# Patient Record
Sex: Female | Born: 1965 | Race: Black or African American | Hispanic: No | Marital: Single | Smoking: Never smoker
Health system: Southern US, Community
[De-identification: ages and names within clinical notes are randomized; demographics above are authoritative.]

## PROBLEM LIST (undated history)

## (undated) DIAGNOSIS — B59 Pneumocystosis: Secondary | ICD-10-CM

## (undated) DIAGNOSIS — B20 Human immunodeficiency virus [HIV] disease: Secondary | ICD-10-CM

## (undated) DIAGNOSIS — C9000 Multiple myeloma not having achieved remission: Secondary | ICD-10-CM

## (undated) DIAGNOSIS — J189 Pneumonia, unspecified organism: Secondary | ICD-10-CM

## (undated) DIAGNOSIS — D259 Leiomyoma of uterus, unspecified: Secondary | ICD-10-CM

## (undated) DIAGNOSIS — G629 Polyneuropathy, unspecified: Secondary | ICD-10-CM

## (undated) DIAGNOSIS — Z8619 Personal history of other infectious and parasitic diseases: Secondary | ICD-10-CM

## (undated) DIAGNOSIS — Z87898 Personal history of other specified conditions: Secondary | ICD-10-CM

## (undated) HISTORY — PX: NO PAST SURGERIES: SHX2092

## (undated) HISTORY — PX: BONE MARROW TRANSPLANT: SHX200

## (undated) HISTORY — DX: Multiple myeloma not having achieved remission: C90.00

---

## 2000-04-05 ENCOUNTER — Ambulatory Visit (HOSPITAL_COMMUNITY): Admission: RE | Admit: 2000-04-05 | Discharge: 2000-04-05 | Payer: Self-pay | Admitting: Gastroenterology

## 2000-04-05 ENCOUNTER — Encounter: Payer: Self-pay | Admitting: Gastroenterology

## 2000-10-14 ENCOUNTER — Emergency Department (HOSPITAL_COMMUNITY): Admission: EM | Admit: 2000-10-14 | Discharge: 2000-10-14 | Payer: Self-pay | Admitting: Emergency Medicine

## 2001-07-19 ENCOUNTER — Encounter: Admission: RE | Admit: 2001-07-19 | Discharge: 2001-07-19 | Payer: Self-pay | Admitting: General Practice

## 2001-07-19 ENCOUNTER — Encounter: Payer: Self-pay | Admitting: General Practice

## 2002-01-08 ENCOUNTER — Encounter: Payer: Self-pay | Admitting: Emergency Medicine

## 2002-01-08 ENCOUNTER — Inpatient Hospital Stay (HOSPITAL_COMMUNITY): Admission: EM | Admit: 2002-01-08 | Discharge: 2002-01-15 | Payer: Self-pay | Admitting: Emergency Medicine

## 2002-01-08 DIAGNOSIS — Z8619 Personal history of other infectious and parasitic diseases: Secondary | ICD-10-CM

## 2002-01-08 DIAGNOSIS — B59 Pneumocystosis: Secondary | ICD-10-CM

## 2002-01-08 HISTORY — DX: Personal history of other infectious and parasitic diseases: Z86.19

## 2002-01-08 HISTORY — DX: Pneumocystosis: B59

## 2002-01-09 ENCOUNTER — Encounter (INDEPENDENT_AMBULATORY_CARE_PROVIDER_SITE_OTHER): Payer: Self-pay | Admitting: *Deleted

## 2002-01-09 ENCOUNTER — Encounter: Payer: Self-pay | Admitting: Internal Medicine

## 2002-01-09 LAB — CONVERTED CEMR LAB
CD4 Count: 40 microliters
CD4 T Cell Abs: 40

## 2002-01-10 ENCOUNTER — Encounter: Payer: Self-pay | Admitting: Internal Medicine

## 2002-02-18 ENCOUNTER — Encounter: Admission: RE | Admit: 2002-02-18 | Discharge: 2002-02-18 | Payer: Self-pay | Admitting: Infectious Diseases

## 2002-02-18 ENCOUNTER — Ambulatory Visit (HOSPITAL_COMMUNITY): Admission: RE | Admit: 2002-02-18 | Discharge: 2002-02-18 | Payer: Self-pay | Admitting: Infectious Diseases

## 2002-02-21 ENCOUNTER — Encounter: Admission: RE | Admit: 2002-02-21 | Discharge: 2002-02-21 | Payer: Self-pay | Admitting: Infectious Diseases

## 2002-02-22 ENCOUNTER — Encounter: Admission: RE | Admit: 2002-02-22 | Discharge: 2002-02-22 | Payer: Self-pay | Admitting: Infectious Diseases

## 2002-02-27 ENCOUNTER — Encounter: Admission: RE | Admit: 2002-02-27 | Discharge: 2002-02-27 | Payer: Self-pay | Admitting: Infectious Diseases

## 2002-03-04 ENCOUNTER — Encounter: Admission: RE | Admit: 2002-03-04 | Discharge: 2002-03-04 | Payer: Self-pay | Admitting: Infectious Diseases

## 2002-03-14 ENCOUNTER — Encounter: Admission: RE | Admit: 2002-03-14 | Discharge: 2002-03-14 | Payer: Self-pay | Admitting: Infectious Diseases

## 2002-04-08 ENCOUNTER — Ambulatory Visit (HOSPITAL_COMMUNITY): Admission: RE | Admit: 2002-04-08 | Discharge: 2002-04-08 | Payer: Self-pay | Admitting: Infectious Diseases

## 2002-04-08 ENCOUNTER — Encounter: Admission: RE | Admit: 2002-04-08 | Discharge: 2002-04-08 | Payer: Self-pay | Admitting: Infectious Diseases

## 2002-05-06 ENCOUNTER — Encounter: Admission: RE | Admit: 2002-05-06 | Discharge: 2002-05-06 | Payer: Self-pay | Admitting: Infectious Diseases

## 2002-06-17 ENCOUNTER — Encounter: Admission: RE | Admit: 2002-06-17 | Discharge: 2002-06-17 | Payer: Self-pay | Admitting: Infectious Diseases

## 2002-06-17 ENCOUNTER — Ambulatory Visit (HOSPITAL_COMMUNITY): Admission: RE | Admit: 2002-06-17 | Discharge: 2002-06-17 | Payer: Self-pay | Admitting: Infectious Diseases

## 2002-07-08 ENCOUNTER — Encounter: Admission: RE | Admit: 2002-07-08 | Discharge: 2002-07-08 | Payer: Self-pay | Admitting: Infectious Diseases

## 2002-10-14 ENCOUNTER — Encounter: Admission: RE | Admit: 2002-10-14 | Discharge: 2002-10-14 | Payer: Self-pay | Admitting: Infectious Diseases

## 2002-10-18 ENCOUNTER — Encounter: Payer: Self-pay | Admitting: Emergency Medicine

## 2002-10-19 ENCOUNTER — Inpatient Hospital Stay (HOSPITAL_COMMUNITY): Admission: EM | Admit: 2002-10-19 | Discharge: 2002-11-09 | Payer: Self-pay | Admitting: Emergency Medicine

## 2002-10-19 ENCOUNTER — Encounter: Payer: Self-pay | Admitting: Internal Medicine

## 2002-10-19 ENCOUNTER — Encounter: Payer: Self-pay | Admitting: Emergency Medicine

## 2002-10-20 ENCOUNTER — Encounter: Payer: Self-pay | Admitting: Pulmonary Disease

## 2002-10-20 ENCOUNTER — Encounter: Payer: Self-pay | Admitting: Internal Medicine

## 2002-10-21 ENCOUNTER — Encounter: Payer: Self-pay | Admitting: Pulmonary Disease

## 2002-10-22 ENCOUNTER — Encounter: Payer: Self-pay | Admitting: Pulmonary Disease

## 2002-10-23 ENCOUNTER — Encounter: Payer: Self-pay | Admitting: Pulmonary Disease

## 2002-10-24 ENCOUNTER — Encounter: Payer: Self-pay | Admitting: Pulmonary Disease

## 2002-10-26 ENCOUNTER — Encounter: Payer: Self-pay | Admitting: Pulmonary Disease

## 2002-10-27 ENCOUNTER — Encounter: Payer: Self-pay | Admitting: Pulmonary Disease

## 2002-10-28 ENCOUNTER — Encounter: Payer: Self-pay | Admitting: Pulmonary Disease

## 2002-10-28 ENCOUNTER — Encounter (INDEPENDENT_AMBULATORY_CARE_PROVIDER_SITE_OTHER): Payer: Self-pay | Admitting: Cardiology

## 2002-10-29 ENCOUNTER — Encounter: Payer: Self-pay | Admitting: Pulmonary Disease

## 2002-10-30 ENCOUNTER — Encounter: Payer: Self-pay | Admitting: Critical Care Medicine

## 2002-10-31 ENCOUNTER — Encounter: Payer: Self-pay | Admitting: Internal Medicine

## 2002-11-06 ENCOUNTER — Encounter: Payer: Self-pay | Admitting: Internal Medicine

## 2002-11-18 ENCOUNTER — Encounter: Admission: RE | Admit: 2002-11-18 | Discharge: 2002-11-18 | Payer: Self-pay | Admitting: Infectious Diseases

## 2003-01-23 ENCOUNTER — Encounter: Admission: RE | Admit: 2003-01-23 | Discharge: 2003-01-23 | Payer: Self-pay | Admitting: Infectious Diseases

## 2003-01-23 ENCOUNTER — Encounter (INDEPENDENT_AMBULATORY_CARE_PROVIDER_SITE_OTHER): Payer: Self-pay | Admitting: Infectious Diseases

## 2003-02-03 ENCOUNTER — Encounter: Admission: RE | Admit: 2003-02-03 | Discharge: 2003-02-03 | Payer: Self-pay | Admitting: Infectious Diseases

## 2003-04-28 ENCOUNTER — Encounter (INDEPENDENT_AMBULATORY_CARE_PROVIDER_SITE_OTHER): Payer: Self-pay | Admitting: Infectious Diseases

## 2003-04-28 ENCOUNTER — Encounter: Admission: RE | Admit: 2003-04-28 | Discharge: 2003-04-28 | Payer: Self-pay | Admitting: Infectious Diseases

## 2003-04-28 ENCOUNTER — Ambulatory Visit (HOSPITAL_COMMUNITY): Admission: RE | Admit: 2003-04-28 | Discharge: 2003-04-28 | Payer: Self-pay | Admitting: Infectious Diseases

## 2003-05-12 ENCOUNTER — Encounter: Admission: RE | Admit: 2003-05-12 | Discharge: 2003-05-12 | Payer: Self-pay | Admitting: Infectious Diseases

## 2003-05-30 ENCOUNTER — Encounter (INDEPENDENT_AMBULATORY_CARE_PROVIDER_SITE_OTHER): Payer: Self-pay | Admitting: Specialist

## 2003-05-30 ENCOUNTER — Encounter: Admission: RE | Admit: 2003-05-30 | Discharge: 2003-05-30 | Payer: Self-pay | Admitting: Infectious Diseases

## 2003-07-08 ENCOUNTER — Encounter (INDEPENDENT_AMBULATORY_CARE_PROVIDER_SITE_OTHER): Payer: Self-pay

## 2003-07-08 ENCOUNTER — Other Ambulatory Visit: Admission: RE | Admit: 2003-07-08 | Discharge: 2003-07-08 | Payer: Self-pay | Admitting: Obstetrics and Gynecology

## 2003-07-08 ENCOUNTER — Encounter: Admission: RE | Admit: 2003-07-08 | Discharge: 2003-07-08 | Payer: Self-pay | Admitting: Obstetrics and Gynecology

## 2003-07-29 ENCOUNTER — Encounter: Admission: RE | Admit: 2003-07-29 | Discharge: 2003-07-29 | Payer: Self-pay | Admitting: Internal Medicine

## 2003-08-11 ENCOUNTER — Encounter: Admission: RE | Admit: 2003-08-11 | Discharge: 2003-08-11 | Payer: Self-pay | Admitting: Infectious Diseases

## 2003-08-11 ENCOUNTER — Encounter (INDEPENDENT_AMBULATORY_CARE_PROVIDER_SITE_OTHER): Payer: Self-pay | Admitting: Infectious Diseases

## 2003-08-11 ENCOUNTER — Ambulatory Visit (HOSPITAL_COMMUNITY): Admission: RE | Admit: 2003-08-11 | Discharge: 2003-08-11 | Payer: Self-pay | Admitting: Infectious Diseases

## 2003-08-12 ENCOUNTER — Encounter: Admission: RE | Admit: 2003-08-12 | Discharge: 2003-08-12 | Payer: Self-pay | Admitting: Obstetrics and Gynecology

## 2003-08-25 ENCOUNTER — Encounter: Admission: RE | Admit: 2003-08-25 | Discharge: 2003-08-25 | Payer: Self-pay | Admitting: Infectious Diseases

## 2003-09-02 ENCOUNTER — Other Ambulatory Visit: Admission: RE | Admit: 2003-09-02 | Discharge: 2003-09-02 | Payer: Self-pay | Admitting: Obstetrics and Gynecology

## 2003-09-02 ENCOUNTER — Encounter: Admission: RE | Admit: 2003-09-02 | Discharge: 2003-09-02 | Payer: Self-pay | Admitting: Obstetrics and Gynecology

## 2003-09-02 ENCOUNTER — Encounter (INDEPENDENT_AMBULATORY_CARE_PROVIDER_SITE_OTHER): Payer: Self-pay | Admitting: *Deleted

## 2003-09-15 ENCOUNTER — Ambulatory Visit (HOSPITAL_COMMUNITY): Admission: RE | Admit: 2003-09-15 | Discharge: 2003-09-15 | Payer: Self-pay | Admitting: Infectious Diseases

## 2003-09-16 ENCOUNTER — Encounter: Admission: RE | Admit: 2003-09-16 | Discharge: 2003-09-16 | Payer: Self-pay | Admitting: Obstetrics and Gynecology

## 2003-09-22 ENCOUNTER — Encounter: Admission: RE | Admit: 2003-09-22 | Discharge: 2003-09-22 | Payer: Self-pay | Admitting: Infectious Diseases

## 2003-12-08 ENCOUNTER — Encounter: Admission: RE | Admit: 2003-12-08 | Discharge: 2003-12-08 | Payer: Self-pay | Admitting: Infectious Diseases

## 2003-12-08 ENCOUNTER — Ambulatory Visit (HOSPITAL_COMMUNITY): Admission: RE | Admit: 2003-12-08 | Discharge: 2003-12-08 | Payer: Self-pay | Admitting: Infectious Diseases

## 2003-12-22 ENCOUNTER — Encounter: Admission: RE | Admit: 2003-12-22 | Discharge: 2003-12-22 | Payer: Self-pay | Admitting: Infectious Diseases

## 2004-01-13 ENCOUNTER — Encounter (INDEPENDENT_AMBULATORY_CARE_PROVIDER_SITE_OTHER): Payer: Self-pay | Admitting: *Deleted

## 2004-01-13 ENCOUNTER — Encounter: Admission: RE | Admit: 2004-01-13 | Discharge: 2004-01-13 | Payer: Self-pay | Admitting: Obstetrics and Gynecology

## 2004-04-08 ENCOUNTER — Encounter: Admission: RE | Admit: 2004-04-08 | Discharge: 2004-04-08 | Payer: Self-pay | Admitting: Infectious Diseases

## 2004-04-08 ENCOUNTER — Ambulatory Visit (HOSPITAL_COMMUNITY): Admission: RE | Admit: 2004-04-08 | Discharge: 2004-04-08 | Payer: Self-pay | Admitting: Infectious Diseases

## 2004-04-26 ENCOUNTER — Encounter: Admission: RE | Admit: 2004-04-26 | Discharge: 2004-04-26 | Payer: Self-pay | Admitting: Infectious Diseases

## 2004-07-13 ENCOUNTER — Ambulatory Visit: Payer: Self-pay | Admitting: *Deleted

## 2004-07-27 ENCOUNTER — Ambulatory Visit: Payer: Self-pay | Admitting: Infectious Diseases

## 2004-08-23 ENCOUNTER — Ambulatory Visit: Payer: Self-pay | Admitting: Infectious Diseases

## 2004-08-23 ENCOUNTER — Ambulatory Visit (HOSPITAL_COMMUNITY): Admission: RE | Admit: 2004-08-23 | Discharge: 2004-08-23 | Payer: Self-pay | Admitting: Infectious Diseases

## 2004-09-06 ENCOUNTER — Ambulatory Visit: Payer: Self-pay | Admitting: Infectious Diseases

## 2005-01-10 ENCOUNTER — Ambulatory Visit: Payer: Self-pay | Admitting: Infectious Diseases

## 2005-01-10 ENCOUNTER — Ambulatory Visit (HOSPITAL_COMMUNITY): Admission: RE | Admit: 2005-01-10 | Discharge: 2005-01-10 | Payer: Self-pay | Admitting: Infectious Diseases

## 2005-03-18 ENCOUNTER — Encounter: Payer: Self-pay | Admitting: Family Medicine

## 2005-03-18 ENCOUNTER — Ambulatory Visit: Payer: Self-pay | Admitting: Family Medicine

## 2005-05-23 ENCOUNTER — Ambulatory Visit (HOSPITAL_COMMUNITY): Admission: RE | Admit: 2005-05-23 | Discharge: 2005-05-23 | Payer: Self-pay | Admitting: Infectious Diseases

## 2005-05-23 ENCOUNTER — Ambulatory Visit: Payer: Self-pay | Admitting: Infectious Diseases

## 2005-06-06 ENCOUNTER — Ambulatory Visit: Payer: Self-pay | Admitting: Infectious Diseases

## 2005-09-22 ENCOUNTER — Ambulatory Visit: Payer: Self-pay | Admitting: Infectious Diseases

## 2005-09-22 ENCOUNTER — Encounter (INDEPENDENT_AMBULATORY_CARE_PROVIDER_SITE_OTHER): Payer: Self-pay | Admitting: *Deleted

## 2005-09-22 ENCOUNTER — Ambulatory Visit (HOSPITAL_COMMUNITY): Admission: RE | Admit: 2005-09-22 | Discharge: 2005-09-22 | Payer: Self-pay | Admitting: Infectious Diseases

## 2005-09-22 LAB — CONVERTED CEMR LAB
CD4 Count: 440 microliters
HIV 1 RNA Quant: 49 copies/mL

## 2005-09-23 ENCOUNTER — Ambulatory Visit: Payer: Self-pay | Admitting: Family Medicine

## 2005-09-23 ENCOUNTER — Encounter: Payer: Self-pay | Admitting: Family Medicine

## 2005-09-29 ENCOUNTER — Ambulatory Visit: Payer: Self-pay | Admitting: Infectious Diseases

## 2006-04-07 ENCOUNTER — Encounter (INDEPENDENT_AMBULATORY_CARE_PROVIDER_SITE_OTHER): Payer: Self-pay | Admitting: *Deleted

## 2006-04-07 ENCOUNTER — Encounter: Admission: RE | Admit: 2006-04-07 | Discharge: 2006-04-07 | Payer: Self-pay | Admitting: Infectious Diseases

## 2006-04-07 ENCOUNTER — Ambulatory Visit: Payer: Self-pay | Admitting: Infectious Diseases

## 2006-04-07 LAB — CONVERTED CEMR LAB: HIV 1 RNA Quant: 49 copies/mL

## 2006-04-19 ENCOUNTER — Ambulatory Visit: Payer: Self-pay | Admitting: Obstetrics and Gynecology

## 2006-04-20 ENCOUNTER — Ambulatory Visit: Payer: Self-pay | Admitting: Infectious Diseases

## 2006-08-01 ENCOUNTER — Ambulatory Visit: Payer: Self-pay | Admitting: Infectious Diseases

## 2006-08-07 DIAGNOSIS — R7881 Bacteremia: Secondary | ICD-10-CM | POA: Insufficient documentation

## 2006-08-07 DIAGNOSIS — A419 Sepsis, unspecified organism: Secondary | ICD-10-CM | POA: Insufficient documentation

## 2006-08-07 DIAGNOSIS — B20 Human immunodeficiency virus [HIV] disease: Secondary | ICD-10-CM | POA: Insufficient documentation

## 2006-08-07 DIAGNOSIS — Z8709 Personal history of other diseases of the respiratory system: Secondary | ICD-10-CM | POA: Insufficient documentation

## 2006-08-07 DIAGNOSIS — G609 Hereditary and idiopathic neuropathy, unspecified: Secondary | ICD-10-CM | POA: Insufficient documentation

## 2006-08-07 DIAGNOSIS — J13 Pneumonia due to Streptococcus pneumoniae: Secondary | ICD-10-CM

## 2006-09-29 ENCOUNTER — Ambulatory Visit: Payer: Self-pay | Admitting: Infectious Diseases

## 2006-09-29 ENCOUNTER — Encounter: Admission: RE | Admit: 2006-09-29 | Discharge: 2006-09-29 | Payer: Self-pay | Admitting: Infectious Diseases

## 2006-09-29 ENCOUNTER — Encounter (INDEPENDENT_AMBULATORY_CARE_PROVIDER_SITE_OTHER): Payer: Self-pay | Admitting: *Deleted

## 2006-09-29 LAB — CONVERTED CEMR LAB
ALT: 26 units/L (ref 0–35)
AST: 35 units/L (ref 0–37)
Albumin: 3.8 g/dL (ref 3.5–5.2)
Alkaline Phosphatase: 95 units/L (ref 39–117)
BUN: 15 mg/dL (ref 6–23)
Basophils Absolute: 0 10*3/uL (ref 0.0–0.1)
Basophils Relative: 0 % (ref 0–1)
CO2: 24 meq/L (ref 19–32)
Calcium: 9.3 mg/dL (ref 8.4–10.5)
Chloride: 103 meq/L (ref 96–112)
Creatinine, Ser: 0.84 mg/dL (ref 0.40–1.20)
Eosinophils Relative: 1 % (ref 0–4)
Glucose, Bld: 73 mg/dL (ref 70–99)
HCT: 39.3 % (ref 34.4–43.3)
HIV 1 RNA Quant: <50 copies/mL (ref <50)
Hemoglobin: 12.9 g/dL (ref 11.7–14.8)
Lymphocytes Relative: 41 % (ref 15–43)
Lymphs Abs: 1.9 10*3/uL (ref 0.8–3.1)
MCHC: 32.8 g/dL — ABNORMAL LOW (ref 33.1–35.4)
MCV: 102.3 fL — ABNORMAL HIGH (ref 78.8–100.0)
Monocytes Absolute: 0.2 10*3/uL (ref 0.2–0.7)
Monocytes Relative: 5 % (ref 3–11)
Neutro Abs: 2.4 10*3/uL (ref 1.8–6.8)
Neutrophils Relative %: 53 % (ref 47–77)
Platelets: 317 10*3/uL (ref 152–374)
Potassium: 4 meq/L (ref 3.5–5.3)
RBC: 3.84 M/uL (ref 3.79–4.96)
RDW: 13.7 % (ref 11.5–15.3)
Sodium: 135 meq/L (ref 135–145)
Total Bilirubin: 0.8 mg/dL (ref 0.3–1.2)
Total Protein: 9.2 g/dL — ABNORMAL HIGH (ref 6.0–8.3)
WBC: 4.6 10*3/uL (ref 3.7–10.0)

## 2006-10-23 ENCOUNTER — Ambulatory Visit: Payer: Self-pay | Admitting: Infectious Diseases

## 2006-11-20 ENCOUNTER — Encounter (INDEPENDENT_AMBULATORY_CARE_PROVIDER_SITE_OTHER): Payer: Self-pay | Admitting: Infectious Diseases

## 2006-12-04 ENCOUNTER — Encounter (INDEPENDENT_AMBULATORY_CARE_PROVIDER_SITE_OTHER): Payer: Self-pay | Admitting: *Deleted

## 2006-12-04 LAB — CONVERTED CEMR LAB

## 2006-12-17 ENCOUNTER — Encounter (INDEPENDENT_AMBULATORY_CARE_PROVIDER_SITE_OTHER): Payer: Self-pay | Admitting: *Deleted

## 2006-12-18 ENCOUNTER — Encounter (INDEPENDENT_AMBULATORY_CARE_PROVIDER_SITE_OTHER): Payer: Self-pay | Admitting: *Deleted

## 2007-01-04 ENCOUNTER — Telehealth (INDEPENDENT_AMBULATORY_CARE_PROVIDER_SITE_OTHER): Payer: Self-pay | Admitting: Infectious Diseases

## 2007-03-07 ENCOUNTER — Ambulatory Visit: Payer: Self-pay | Admitting: Infectious Diseases

## 2007-03-27 ENCOUNTER — Encounter: Payer: Self-pay | Admitting: Internal Medicine

## 2007-04-18 ENCOUNTER — Encounter: Admission: RE | Admit: 2007-04-18 | Discharge: 2007-04-18 | Payer: Self-pay | Admitting: Internal Medicine

## 2007-04-18 ENCOUNTER — Ambulatory Visit: Payer: Self-pay | Admitting: Internal Medicine

## 2007-04-18 LAB — CONVERTED CEMR LAB
Alkaline Phosphatase: 103 units/L (ref 39–117)
CO2: 22 meq/L (ref 19–32)
Creatinine, Ser: 0.72 mg/dL (ref 0.40–1.20)
Eosinophils Absolute: 0 10*3/uL (ref 0.0–0.7)
Eosinophils Relative: 0 % (ref 0–5)
Glucose, Bld: 92 mg/dL (ref 70–99)
HCT: 37.7 % (ref 36.0–46.0)
HIV 1 RNA Quant: <50 copies/mL (ref <50)
HIV-1 RNA Quant, Log: <1.7 (ref <1.70)
Hemoglobin: 12.3 g/dL (ref 12.0–15.0)
Lymphocytes Relative: 51 % — ABNORMAL HIGH (ref 12–46)
Lymphs Abs: 2.3 10*3/uL (ref 0.7–3.3)
MCV: 101.6 fL — ABNORMAL HIGH (ref 78.0–100.0)
Monocytes Absolute: 0.3 10*3/uL (ref 0.2–0.7)
Platelets: 298 10*3/uL (ref 150–400)
RDW: 13.6 % (ref 11.5–14.0)
Total Bilirubin: 0.3 mg/dL (ref 0.3–1.2)
WBC: 4.5 10*3/uL (ref 4.0–10.5)

## 2007-04-19 ENCOUNTER — Encounter: Payer: Self-pay | Admitting: Internal Medicine

## 2007-04-30 ENCOUNTER — Encounter (INDEPENDENT_AMBULATORY_CARE_PROVIDER_SITE_OTHER): Payer: Self-pay | Admitting: *Deleted

## 2007-04-30 LAB — CONVERTED CEMR LAB: Pap Smear: NORMAL

## 2007-05-02 ENCOUNTER — Ambulatory Visit: Payer: Self-pay | Admitting: Internal Medicine

## 2007-10-09 ENCOUNTER — Encounter (INDEPENDENT_AMBULATORY_CARE_PROVIDER_SITE_OTHER): Payer: Self-pay | Admitting: *Deleted

## 2007-10-15 ENCOUNTER — Encounter: Admission: RE | Admit: 2007-10-15 | Discharge: 2007-10-15 | Payer: Self-pay | Admitting: Internal Medicine

## 2007-10-15 ENCOUNTER — Ambulatory Visit: Payer: Self-pay | Admitting: Internal Medicine

## 2007-10-15 LAB — CONVERTED CEMR LAB
ALT: 29 units/L (ref 0–35)
Alkaline Phosphatase: 81 units/L (ref 39–117)
Basophils Absolute: 0 10*3/uL (ref 0.0–0.1)
Bilirubin Urine: NEGATIVE
Eosinophils Absolute: 0 10*3/uL (ref 0.0–0.7)
Eosinophils Relative: 1 % (ref 0–5)
Glucose, Bld: 104 mg/dL — ABNORMAL HIGH (ref 70–99)
HCT: 37.5 % (ref 36.0–46.0)
HIV 1 RNA Quant: <50 copies/mL (ref <50)
Hemoglobin, Urine: NEGATIVE
Ketones, ur: NEGATIVE mg/dL
LDL Cholesterol: 119 mg/dL — ABNORMAL HIGH (ref 0–99)
MCV: 101.6 fL — ABNORMAL HIGH (ref 78.0–100.0)
Platelets: 302 10*3/uL (ref 150–400)
Protein, ur: NEGATIVE mg/dL
RDW: 13.5 % (ref 11.5–15.5)
Sodium: 139 meq/L (ref 135–145)
Total Bilirubin: 0.5 mg/dL (ref 0.3–1.2)
Total CHOL/HDL Ratio: 3.9
Total Protein: 8.8 g/dL — ABNORMAL HIGH (ref 6.0–8.3)
Triglycerides: 63 mg/dL (ref <150)
Urobilinogen, UA: 0.2 (ref 0.0–1.0)
VLDL: 13 mg/dL (ref 0–40)

## 2007-12-17 ENCOUNTER — Encounter: Payer: Self-pay | Admitting: Internal Medicine

## 2007-12-19 ENCOUNTER — Ambulatory Visit: Payer: Self-pay | Admitting: Internal Medicine

## 2007-12-19 ENCOUNTER — Encounter (INDEPENDENT_AMBULATORY_CARE_PROVIDER_SITE_OTHER): Payer: Self-pay | Admitting: *Deleted

## 2007-12-19 ENCOUNTER — Encounter: Admission: RE | Admit: 2007-12-19 | Discharge: 2007-12-19 | Payer: Self-pay | Admitting: Internal Medicine

## 2007-12-19 LAB — CONVERTED CEMR LAB
ALT: 20 units/L (ref 0–35)
AST: 22 units/L (ref 0–37)
BUN: 16 mg/dL (ref 6–23)
Basophils Absolute: 0 10*3/uL (ref 0.0–0.1)
Basophils Relative: 0 % (ref 0–1)
Creatinine, Ser: 0.65 mg/dL (ref 0.40–1.20)
Eosinophils Absolute: 0 10*3/uL (ref 0.0–0.7)
Eosinophils Relative: 1 % (ref 0–5)
HCT: 34.8 % — ABNORMAL LOW (ref 36.0–46.0)
Hemoglobin: 11.4 g/dL — ABNORMAL LOW (ref 12.0–15.0)
MCHC: 32.8 g/dL (ref 30.0–36.0)
MCV: 100.6 fL — ABNORMAL HIGH (ref 78.0–100.0)
Monocytes Absolute: 0.4 10*3/uL (ref 0.1–1.0)
RDW: 13.3 % (ref 11.5–15.5)
Total Bilirubin: 0.4 mg/dL (ref 0.3–1.2)

## 2008-01-03 ENCOUNTER — Encounter (INDEPENDENT_AMBULATORY_CARE_PROVIDER_SITE_OTHER): Payer: Self-pay | Admitting: *Deleted

## 2008-01-11 ENCOUNTER — Ambulatory Visit: Payer: Self-pay | Admitting: Internal Medicine

## 2008-01-11 ENCOUNTER — Encounter: Payer: Self-pay | Admitting: Internal Medicine

## 2008-01-16 ENCOUNTER — Encounter: Payer: Self-pay | Admitting: Internal Medicine

## 2008-04-17 ENCOUNTER — Telehealth (INDEPENDENT_AMBULATORY_CARE_PROVIDER_SITE_OTHER): Payer: Self-pay | Admitting: *Deleted

## 2008-04-21 ENCOUNTER — Ambulatory Visit: Payer: Self-pay | Admitting: Internal Medicine

## 2008-05-06 ENCOUNTER — Encounter (INDEPENDENT_AMBULATORY_CARE_PROVIDER_SITE_OTHER): Payer: Self-pay | Admitting: *Deleted

## 2008-07-31 ENCOUNTER — Ambulatory Visit: Payer: Self-pay | Admitting: Internal Medicine

## 2008-07-31 ENCOUNTER — Encounter (INDEPENDENT_AMBULATORY_CARE_PROVIDER_SITE_OTHER): Payer: Self-pay | Admitting: Licensed Clinical Social Worker

## 2008-07-31 LAB — CONVERTED CEMR LAB
ALT: 27 units/L (ref 0–35)
Alkaline Phosphatase: 121 units/L — ABNORMAL HIGH (ref 39–117)
Basophils Relative: 0 % (ref 0–1)
CO2: 23 meq/L (ref 19–32)
Chloride: 106 meq/L (ref 96–112)
Eosinophils Absolute: 0.1 10*3/uL (ref 0.0–0.7)
HIV 1 RNA Quant: <50 copies/mL (ref <50)
Hemoglobin: 12.5 g/dL (ref 12.0–15.0)
Lymphs Abs: 2.7 10*3/uL (ref 0.7–4.0)
MCHC: 32.6 g/dL (ref 30.0–36.0)
MCV: 101.3 fL — ABNORMAL HIGH (ref 78.0–100.0)
Monocytes Absolute: 0.4 10*3/uL (ref 0.1–1.0)
Monocytes Relative: 8 % (ref 3–12)
Neutro Abs: 1.6 10*3/uL — ABNORMAL LOW (ref 1.7–7.7)
Potassium: 4.6 meq/L (ref 3.5–5.3)
RBC: 3.78 M/uL — ABNORMAL LOW (ref 3.87–5.11)
Total Bilirubin: 0.5 mg/dL (ref 0.3–1.2)
Total Protein: 8.6 g/dL — ABNORMAL HIGH (ref 6.0–8.3)
WBC: 4.8 10*3/uL (ref 4.0–10.5)

## 2008-10-08 ENCOUNTER — Encounter: Payer: Self-pay | Admitting: Internal Medicine

## 2008-10-21 ENCOUNTER — Ambulatory Visit: Payer: Self-pay | Admitting: Internal Medicine

## 2008-10-21 LAB — CONVERTED CEMR LAB
ALT: 24 units/L (ref 0–35)
AST: 22 units/L (ref 0–37)
Basophils Absolute: 0 10*3/uL (ref 0.0–0.1)
Basophils Relative: 0 % (ref 0–1)
Cholesterol: 184 mg/dL (ref 0–200)
Creatinine, Ser: 0.76 mg/dL (ref 0.40–1.20)
Eosinophils Relative: 1 % (ref 0–5)
HCT: 37.2 % (ref 36.0–46.0)
HIV 1 RNA Quant: 85 copies/mL — ABNORMAL HIGH (ref <48)
Hemoglobin: 12 g/dL (ref 12.0–15.0)
MCHC: 32.3 g/dL (ref 30.0–36.0)
Monocytes Absolute: 0.4 10*3/uL (ref 0.1–1.0)
RDW: 13 % (ref 11.5–15.5)
Total Bilirubin: 0.5 mg/dL (ref 0.3–1.2)
Total CHOL/HDL Ratio: 4.3
VLDL: 19 mg/dL (ref 0–40)

## 2008-11-28 ENCOUNTER — Ambulatory Visit: Payer: Self-pay | Admitting: Internal Medicine

## 2008-11-28 DIAGNOSIS — B009 Herpesviral infection, unspecified: Secondary | ICD-10-CM | POA: Insufficient documentation

## 2009-01-19 ENCOUNTER — Encounter: Payer: Self-pay | Admitting: Internal Medicine

## 2009-02-04 ENCOUNTER — Encounter (INDEPENDENT_AMBULATORY_CARE_PROVIDER_SITE_OTHER): Payer: Self-pay | Admitting: *Deleted

## 2009-02-06 ENCOUNTER — Ambulatory Visit: Payer: Self-pay | Admitting: Internal Medicine

## 2009-02-06 ENCOUNTER — Encounter: Payer: Self-pay | Admitting: Internal Medicine

## 2009-02-11 ENCOUNTER — Encounter (INDEPENDENT_AMBULATORY_CARE_PROVIDER_SITE_OTHER): Payer: Self-pay | Admitting: *Deleted

## 2009-02-13 ENCOUNTER — Encounter: Payer: Self-pay | Admitting: Internal Medicine

## 2009-06-22 ENCOUNTER — Ambulatory Visit: Payer: Self-pay | Admitting: Internal Medicine

## 2009-06-22 LAB — CONVERTED CEMR LAB
AST: 27 units/L (ref 0–37)
BUN: 15 mg/dL (ref 6–23)
Basophils Absolute: 0 10*3/uL (ref 0.0–0.1)
Calcium: 9.3 mg/dL (ref 8.4–10.5)
Chloride: 107 meq/L (ref 96–112)
Creatinine, Ser: 0.77 mg/dL (ref 0.40–1.20)
Eosinophils Relative: 1 % (ref 0–5)
Glucose, Bld: 66 mg/dL — ABNORMAL LOW (ref 70–99)
HCT: 37.8 % (ref 36.0–46.0)
HIV 1 RNA Quant: <48 copies/mL (ref <48)
HIV-1 RNA Quant, Log: <1.68 (ref <1.68)
Hemoglobin: 12.3 g/dL (ref 12.0–15.0)
Lymphocytes Relative: 36 % (ref 12–46)
Lymphs Abs: 2 10*3/uL (ref 0.7–4.0)
Monocytes Absolute: 0.3 10*3/uL (ref 0.1–1.0)
Monocytes Relative: 6 % (ref 3–12)
RBC: 3.71 M/uL — ABNORMAL LOW (ref 3.87–5.11)
RDW: 13.3 % (ref 11.5–15.5)

## 2009-07-21 ENCOUNTER — Ambulatory Visit: Payer: Self-pay | Admitting: Internal Medicine

## 2009-11-14 ENCOUNTER — Emergency Department (HOSPITAL_COMMUNITY): Admission: EM | Admit: 2009-11-14 | Discharge: 2009-11-15 | Payer: Self-pay | Admitting: Emergency Medicine

## 2009-12-15 ENCOUNTER — Encounter (INDEPENDENT_AMBULATORY_CARE_PROVIDER_SITE_OTHER): Payer: Self-pay | Admitting: *Deleted

## 2010-08-31 ENCOUNTER — Ambulatory Visit: Payer: Self-pay | Admitting: Internal Medicine

## 2010-08-31 LAB — CONVERTED CEMR LAB
ALT: 22 units/L (ref 0–35)
AST: 24 units/L (ref 0–37)
Albumin: 4 g/dL (ref 3.5–5.2)
Alkaline Phosphatase: 177 units/L — ABNORMAL HIGH (ref 39–117)
BUN: 14 mg/dL (ref 6–23)
Basophils Absolute: 0 10*3/uL (ref 0.0–0.1)
Basophils Relative: 0 % (ref 0–1)
Calcium: 9.6 mg/dL (ref 8.4–10.5)
Chloride: 105 meq/L (ref 96–112)
Creatinine, Ser: 0.79 mg/dL (ref 0.40–1.20)
Eosinophils Relative: 1 % (ref 0–5)
HCT: 40.3 % (ref 36.0–46.0)
HIV 1 RNA Quant: <20 copies/mL (ref <20)
Hemoglobin: 13.3 g/dL (ref 12.0–15.0)
Lymphocytes Relative: 59 % — ABNORMAL HIGH (ref 12–46)
MCHC: 33 g/dL (ref 30.0–36.0)
Monocytes Absolute: 0.3 10*3/uL (ref 0.1–1.0)
Platelets: 331 10*3/uL (ref 150–400)
Potassium: 4.1 meq/L (ref 3.5–5.3)
RDW: 13 % (ref 11.5–15.5)

## 2010-09-06 ENCOUNTER — Encounter (INDEPENDENT_AMBULATORY_CARE_PROVIDER_SITE_OTHER): Payer: Self-pay | Admitting: *Deleted

## 2010-09-20 ENCOUNTER — Ambulatory Visit: Payer: Self-pay | Admitting: Internal Medicine

## 2010-10-29 ENCOUNTER — Ambulatory Visit: Admit: 2010-10-29 | Payer: Self-pay | Admitting: Internal Medicine

## 2010-11-09 NOTE — Miscellaneous (Signed)
  Clinical Lists Changes  Observations: Added new observation of YEARAIDSPOS: 2003  (09/06/2010 15:02)

## 2010-11-09 NOTE — Miscellaneous (Signed)
Summary: clinical update/ryan white NCADAP appr til 01/08/11  Clinical Lists Changes  Observations: Added new observation of AIDSDAP: Yes 2011 (12/15/2009 10:27)

## 2010-11-11 NOTE — Assessment & Plan Note (Signed)
Summary: F/U [MKJ]   CC:  follow-up visit, lab results, pt. had been staying with sister in [place], and was hospitalized with pneumonia 2/11.  History of Present Illness: patient here for follow-up.  She states that she was diagnosed with pneumonia back in January beginning of February.  She was treated with high dose Bactrim because they knew she was HIV positive and thought she might have pneumocystis.  She states that she was hallucinating on Bactrim and felt like a nearly killed her.she feels much better now.  She has not missed any doses of her HIV medication.  Preventive Screening-Counseling & Management  Alcohol-Tobacco     Alcohol drinks/day: 0     Smoking Status: never     Passive Smoke Exposure: no  Caffeine-Diet-Exercise     Caffeine use/day: 4     Does Patient Exercise: yes     Type of exercise: walking on treadmill     Times/week: 2  Hep-HIV-STD-Contraception     HIV Risk: no  Safety-Violence-Falls     Seat Belt Use: 100      Drug Use:  no.    Comments: pt. declined condoms   Updated Prior Medication List: TRUVADA 200-300 MG TABS (EMTRICITABINE-TENOFOVIR) Take 1 tablet by mouth once a day KALETRA 200-50 MG TABS (LOPINAVIR-RITONAVIR) Take 4 tablets by mouth once a day  Current Allergies (reviewed today): ! * SUSTIVA ! ASA Past History:  Past Medical History: Last updated: 08/07/2006 HIV disease Peripheral neuropathy, hx of June 2003 Pneumococcal pneumonia, hx of, January 2004 Bacteremia and SIRS, hx of, January 2004 crytococcal fungemia, April 2003  Review of Systems  The patient denies anorexia, fever, and weight loss.    Vital Signs:  Patient profile:   45 year old female Height:      64 inches (162.56 cm) Weight:      180.4 pounds (82 kg) BMI:     31.08 Temp:     97.8 degrees F (36.56 degrees C) oral Pulse rate:   73 / minute BP sitting:   153 / 94  (left arm)  Vitals Entered By: Wendall Mola CMA Duncan Dull) (September 20, 2010 2:26  PM) CC: follow-up visit, lab results, pt. had been staying with sister in [place], was hospitalized with pneumonia 2/11 Is Patient Diabetic? No Pain Assessment Patient in pain? no      Nutritional Status BMI of > 30 = obese Nutritional Status Detail appetite "good"  Have you ever been in a relationship where you felt threatened, hurt or afraid?No   Does patient need assistance? Functional Status Self care Ambulation Normal Comments no missed doses of meds per pt. pt. feels she gained weight because broke right ankle and could not exercise   Physical Exam  General:  alert, well-developed, well-nourished, and well-hydrated.   Head:  normocephalic and atraumatic.   Mouth:  pharynx pink and moist.   Lungs:  normal breath sounds.     Impression & Recommendations:  Problem # 1:  HIV DISEASE (ICD-042) Pt.s most recent CD4ct was 540 and VL <20 .  Pt instructed to continue the current antiretroviral regimen.  Pt encouraged to take medication regularly and not miss doses.  Pt will f/u in 3 months for repeat blood work and will see me 2 weeks later.  Diagnostics Reviewed:  HIV: CDC-defined AIDS (07/21/2009)   CD4: 580 (09/01/2010)   WBC: 5.0 (08/31/2010)   Hgb: 13.3 (08/31/2010)   HCT: 40.3 (08/31/2010)   Platelets: 331 (08/31/2010) HIV-1 RNA: <20 copies/mL (08/31/2010)  HBSAg: No (12/04/2006)  Other Orders: Est. Patient Level III (21308) Future Orders: T-CD4SP ([facility]) (CD4SP) ... 03/19/2011 T-HIV Viral Load [phone]) ... 03/19/2011 T-Comprehensive Metabolic Panel [phone]) ... 03/19/2011 T-CBC w/Diff ([phone]) ... 03/19/2011 T-RPR (Syphilis) [phone]) ... 03/19/2011 T-Lipid Profile [phone]) ... 03/19/2011  Patient Instructions: 1)  Please schedule a follow-up appointment in 6 months, 2 weeks after labs. 2)  Schedule PAP smear in PAP clinic    Immunization History:  Influenza Immunization History:    Influenza:  historical (07/15/2010)

## 2010-12-17 ENCOUNTER — Encounter: Payer: Self-pay | Admitting: Infectious Diseases

## 2010-12-17 ENCOUNTER — Other Ambulatory Visit: Payer: Self-pay | Admitting: Infectious Diseases

## 2010-12-17 ENCOUNTER — Ambulatory Visit (INDEPENDENT_AMBULATORY_CARE_PROVIDER_SITE_OTHER): Payer: Self-pay

## 2010-12-17 ENCOUNTER — Encounter (INDEPENDENT_AMBULATORY_CARE_PROVIDER_SITE_OTHER): Payer: Self-pay | Admitting: *Deleted

## 2010-12-17 DIAGNOSIS — Z124 Encounter for screening for malignant neoplasm of cervix: Secondary | ICD-10-CM

## 2010-12-21 LAB — T-HELPER CELL (CD4) - (RCID CLINIC ONLY): CD4 % Helper T Cell: 21 % — ABNORMAL LOW (ref 33–55)

## 2010-12-21 NOTE — Miscellaneous (Signed)
  Clinical Lists Changes  Observations: Added new observation of AIDSDAP: PENDING APPROVAL 2012 (12/17/2010 14:38) Added new observation of PCTFPL: 204.06  (12/17/2010 14:38) Added new observation of HOUSEINCOME: 16109  (12/17/2010 14:38) Added new observation of FINASSESSDT: 12/17/2010  (12/17/2010 14:38)

## 2010-12-21 NOTE — Assessment & Plan Note (Signed)
Summary: pap,smear[mkj]   Vital Signs:  Patient profile:   45 year old female Menstrual status:  regular LMP:     12/04/2010  Vitals Entered By: Jennet Maduro RN (December 17, 2010 1:59 PM) CC: Advised pt. that it is time to start scheduling Mammagrams.  PAP smear today.  Pt. offered condoms.  Pt given educational materials re:  HIV and women, BSE, mammagrams, self care and nutrition. LMP (date): 12/04/2010 LMP - Character: normal     Menstrual Status regular Enter LMP: 12/04/2010 Last PAP Result NEGATIVE FOR INTRAEPITHELIAL LESIONS OR MALIGNANCY.   CC:  Advised pt. that it is time to start scheduling Mammagrams.  PAP smear today.  Pt. offered condoms.  Pt given educational materials re:  HIV and women, BSE, mammagrams, and self care and nutrition..  Allergies: 1)  ! * Sustiva 2)  ! Asa  Additional History Menstrual Status:  regular   Other Orders: T-PAP [facility]) [phone]) Est. Patient Level I [phone])   Orders Added: 1)  T-PAP [facility]) [88142] 2)  Est. Patient Level I [09811]

## 2010-12-24 ENCOUNTER — Encounter: Payer: Self-pay | Admitting: *Deleted

## 2010-12-28 NOTE — Letter (Signed)
Summary: Results Follow-up Letter  [facility]  [address]   [place], [place] [address]   Phone: [phone]  Fax: [phone]         December 24, 2010  [address] [place], [place]  [address]  [place]  Dear Ms. Scobey,   The following are the results of your recent test(s):  Test     Result     Pap Smear    Normal_XXX___  Not Normal_____       Comments:  Everything was normal.  I will see you in one year for your next PAP smear.  I enjoyed hearing about your traveling experiences.  Thank you for coming to the Center for your care.     Sincerely,  Jennet Maduro [facility]

## 2010-12-29 LAB — CBC
HCT: 43.7 % (ref 36.0–46.0)
Hemoglobin: 14.9 g/dL (ref 12.0–15.0)
MCHC: 34.2 g/dL (ref 30.0–36.0)
MCV: 98.8 fL (ref 78.0–100.0)
Platelets: 131 10*3/uL — ABNORMAL LOW (ref 150–400)
RDW: 13.4 % (ref 11.5–15.5)

## 2010-12-29 LAB — COMPREHENSIVE METABOLIC PANEL
Albumin: 2.9 g/dL — ABNORMAL LOW (ref 3.5–5.2)
Alkaline Phosphatase: 102 U/L (ref 39–117)
BUN: 14 mg/dL (ref 6–23)
Calcium: 8.6 mg/dL (ref 8.4–10.5)
Creatinine, Ser: 1.15 mg/dL (ref 0.4–1.2)
Glucose, Bld: 132 mg/dL — ABNORMAL HIGH (ref 70–99)
Total Protein: 8.2 g/dL (ref 6.0–8.3)

## 2010-12-29 LAB — URINALYSIS, ROUTINE W REFLEX MICROSCOPIC
Glucose, UA: NEGATIVE mg/dL
Ketones, ur: 15 mg/dL — AB
Nitrite: POSITIVE — AB
Protein, ur: >300 mg/dL — AB

## 2010-12-29 LAB — DIFFERENTIAL
Basophils Relative: 0 % (ref 0–1)
Eosinophils Relative: 0 % (ref 0–5)
Lymphocytes Relative: 11 % — ABNORMAL LOW (ref 12–46)
Monocytes Absolute: 0.3 10*3/uL (ref 0.1–1.0)
Monocytes Relative: 4 % (ref 3–12)
Neutrophils Relative %: 85 % — ABNORMAL HIGH (ref 43–77)

## 2010-12-29 LAB — POCT PREGNANCY, URINE: Preg Test, Ur: NEGATIVE

## 2010-12-29 LAB — URINE MICROSCOPIC-ADD ON

## 2010-12-30 ENCOUNTER — Encounter (INDEPENDENT_AMBULATORY_CARE_PROVIDER_SITE_OTHER): Payer: Self-pay | Admitting: *Deleted

## 2011-01-06 NOTE — Miscellaneous (Signed)
  Clinical Lists Changes  Observations: Added new observation of AIDSDAP: Yes 2012 (12/30/2010 17:54)

## 2011-01-14 LAB — T-HELPER CELL (CD4) - (RCID CLINIC ONLY)
CD4 % Helper T Cell: 21 % — ABNORMAL LOW (ref 33–55)
CD4 T Cell Abs: 390 uL — ABNORMAL LOW (ref 400–2700)

## 2011-01-24 LAB — T-HELPER CELL (CD4) - (RCID CLINIC ONLY): CD4 T Cell Abs: 570 uL (ref 400–2700)

## 2011-02-08 ENCOUNTER — Other Ambulatory Visit: Payer: Self-pay | Admitting: *Deleted

## 2011-02-08 DIAGNOSIS — B20 Human immunodeficiency virus [HIV] disease: Secondary | ICD-10-CM

## 2011-02-08 MED ORDER — LOPINAVIR-RITONAVIR 200-50 MG PO TABS: 4.0000 | ORAL_TABLET | Freq: Every day | ORAL | Status: DC

## 2011-02-08 MED ORDER — EMTRICITABINE-TENOFOVIR DF 200-300 MG PO TABS: 1.0000 | ORAL_TABLET | Freq: Every day | ORAL | Status: DC

## 2011-02-25 NOTE — Discharge Summary (Signed)
. [facility]  Patient:    Jennifer Boyle, Jennifer Boyle Visit Number: [id number] MRN: [id number]          Service Type: MED Location: [phone] Attending Physician:  Dorrene German Dictated by:   Verlon Setting Concepcion Elk, M.D. Admit Date:  01/08/2002 Discharge Date: 01/15/2002                             Discharge Summary  ADMITTING DIAGNOSES: 1. Interstitial pneumonia. 2. Hypoxia. 3. Anemia. 4. Dehydration. 5. Oral candidiasis. 6. Immunodeficiency.  DISCHARGE DIAGNOSES: 1. Presumptive Pneumocystis carinii pneumonia. 2. Human immunodeficiency syndrome. 3. Acquired immunodeficiency syndrome. 4. Atrial fibrillation. 5. Cryptococcus infection in blood. 6. Anemia of chronic disease.  PRESENTATION:  The patient is a 45 year old [place] lady who was admitted via the emergency room where she presented with about 2-1/2 months of generally feeling unwell, anorexia, cough productive of scanty white sputum and progressive weight loss.  On evaluation, she was noted to be severely cachectic, pale and dehydrated, had a blood pressure of 98/73 and a pulse oximeter on room air of 88%, her heart rate was 130, her respiratory rate of 28.  She had bronchial breath sounds in her right lung base without any wheezes.  Initial laboratory studies showed chest x-ray with scanty bilateral interstitial pattern and a possible bullous disease in the right upper lobe. Her initial hemoglobin was 8, white cell count of 3, and platelet count of 166,000.  Her LDL was 1293.  She had a strong suspicion of immunosuppression and the patient was therefore treated as a case of presumptive pulmonary Pneumocystis carinii pneumonia.  HOSPITAL COURSE:  On admission, the patient was initially admitted to a medical surgical bed with telemetry, she received intravenous Bactrim at PCP doses, she also received prednisone in view of the hypoxia, Diflucan was given by mouth for oral thrush and  intravenous fluids with D-5 normal saline as well as oxygen therapy by nasal cannula.  She was placed on isolation initially and induced sputum for acid-fast bacilli as well as Pneumocystis carinii were obtained.  An infectious disease consult was requested and the patient was seen by Dr. Roxan Hockey who was very helpful in the management of this patient. CD4 count obtained was 40.  Repeat chest x-ray did not show any worsening of infiltrate.  The patient was discontinued from isolation.  Blood cultures for AFB were negative but grew yeast in the blood and this was confirmed by the end of admission ____________________.  On the second day of admission, the patient developed atrial fibrillation with very poor ventricular response and a cardiology consult was requested from the [facility].  The patient was immediately started on intravenous Cardizem as well as intravenous anticoagulation with heparin.  An echocardiogram was obtained with overall left ventricular systolic function normal, ejection fraction between 55% and 65%.  The patient promptly returned to normal sinus rhythm.  She received 2 units of packed red blood cells transfusion during the course of admission and anemia workup did not reveal any folic or B12 deficiency.  The patient continued to make significant improvement during the course of her admission and was stable for discharge home on January 15, 2002.  DISCHARGE MEDICATIONS: 1. Digoxin 0.25 mg one daily. 2. Cardizem CD 120 mg daily. 3. Bactrim DS two p.o. t.i.d. for 14 days. 4. Prednisone 40 mg daily for 3 days then 20 mg daily for 11 days. 5. Diflucan  400 mg daily. 6. Coumadin 4 mg p.o. daily.  FOLLOWUP: 1. Dr. Elsie Lincoln - January 31, 2002 at 3 p.m. 2. Dr. Concepcion Elk - January 17, 2002. 3. [facility].  The patient was to call    for appointment.  PLAN:  This plan of care was explained to the patient and  accepted by her and all her questions were answered. Dictated by:   Verlon Setting Concepcion Elk, M.D. Attending Physician:  Dorrene German DD:  03/02/02 TD:  03/05/02 Job: [id number]

## 2011-02-25 NOTE — Group Therapy Note (Signed)
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  The speculum was placed so that the cervix was  in the middle view and at 2, 4, 8, and 10 o'clock 1 mL each area of 2%  Xylocaine with 1:100,000 epinephrine was injected into the cervix.  Using a  12 x 10 mm loop a biopsy was taken removing entire transformation zone.  This was done with a 60 blended current cutting and then a coag current was  used to coagulate the area around the biopsy site as well as bleeders within  the biopsy site which was packed with Monsel's solution.  The patient will  return in two weeks for a recheck.                                               Argentina Donovan, MD    PR/MEDQ  D:  09/02/2003  T:  09/02/2003  Job:  [id number]

## 2011-02-25 NOTE — Group Therapy Note (Signed)
   NAME:  [name], [name] NO.:  [id number]   MEDICAL RECORD NO.:  [id number]                   PATIENT TYPE:  OUT   LOCATION:  [facility]                           FACILITY:  [facility]   PHYSICIAN:  Argentina Donovan, MD                     DATE OF BIRTH:  [birth year]   DATE OF SERVICE:  07/08/2003                                    CLINIC NOTE   HISTORY OF PRESENT ILLNESS:  The patient is a 45 year old gravida 1, para 0-  0-1-0 positive for HIV referred from the Infectious Disease Clinic for an  atypical Pap smear showing low grade squamous intraepithelial lesion.  Colposcopy was done putting 2% acetic acid on the cervix.  The transition  zone was seen 360 degrees.  There was acetowhite almost around the entire  circumference of the cervix, especially marked between 9 and 3 o'clock and  between 5 and 8 o'clock with marked punctations around the 6-7 o'clock area  and small punctations up at 1 o'clock without atypical vessels, normal  mosaicism.  Biopsies were taken at 6 and 8 o'clock and endocervical  curettage was carried out.   IMPRESSION:  CIN I pending pathology report.                                               Argentina Donovan, MD    PR/MEDQ  D:  07/08/2003  T:  07/08/2003  Job:  [id number]
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
## 2011-02-25 NOTE — Discharge Summary (Signed)
NAME:  Jennifer Boyle, Jennifer Boyle NO.:  [id number]   MEDICAL RECORD NO.:  [id number]                   PATIENT TYPE:  INP   LOCATION:  3002                                 FACILITY:  [facility]   PHYSICIAN:  Catalina Pizza, M.D.                     DATE OF BIRTH:  1966-06-08   DATE OF ADMISSION:  10/19/2002  DATE OF DISCHARGE:  11/09/2002                                 DISCHARGE SUMMARY   DISCHARGE DIAGNOSES:  1. Pneumococcal pneumonia (strep pneumonia).  2. Pneumococcal septicemia.  3. SIRS.  4. VDRF Secondary to #1.  5. Acquired immunodeficiency syndrome/HIV positive.  6. Nausea and vomiting.  7. Iron deficiency anemia.  8. Oral candidiasis.  9. Malnutrition.   DISCHARGE MEDICATIONS:  1. Prevacid 30 mg p.o. daily.  2. Diflucan 200 mg p.o. daily.  3. Epivir 300 mg p.o. daily.  4. Viread 300 mg p.o. daily.  5. Kaletra three capsules b.i.d.  6. Bactrim DS Monday, Wednesday, Friday.  7. Phenergan 12.5 mg p.o. q.6h. p.r.n. nausea and vomiting.  8. Percocet 5/325 1-2 q.4-6h. p.r.n. pain.   HISTORY OF PRESENT ILLNESS:  The patient is a 45 year old African American  female with past medical history significant for HIV (AIDS previously, CD-4  count of 40 when initially diagnosed).  At previous state of health,  evaluated by Dr. Roxan Hockey on 10/14/02, began to have left sided headache and  sinus pain.  She went to her primary care physician who is Fleet Contras,  M.D. and was treated with a Z-pack (on 10/16/02).  Starting 1/8, began having  lower back/muscle spasms.  Progressively worsening back pain with no  relieving measure.  Movement makes this pain worse, taking short breaths  secondary to minimized movement.  Sinus pain continued but not as concerning  at this time to the patient.   ALLERGIES:  Aspirin sensitivity and Sustiva.   PAST MEDICAL HISTORY:  1. HIV diagnosed 4/03, followed by Dr. Roxan Hockey, had cryptococcal fungemia     at that time.  2. History of  atrial fibrillation, followed by Dr. Elsie Lincoln.   LABORATORY DATA:  On admission, sodium 133, potassium 4.6, chloride 106,  bicarbonate 19, BUN 17, creatinine 0.9, glucose 143, white blood cell count  11.4, hemoglobin 9.3, platelet 315,000.  ANC of 10.3.  MCV 101.0.  D. dimer  was 2.71.  ABG was 7.434/29.7/69.0.  O2 saturation at 93%.   Lab work obtained during admission, more significant lab work upon  discharge.  Sodium 136, potassium 3.5, chloride 106, CO2 22, glucose 109,  BUN 7, creatinine 0.6, calcium 9.4.  Final CBC with hemoglobin 9.5,  hematocrit 28.5, MCV 9.6, platelet 494,000.  Amylase elevated slightly at  158, lipase 78.  Total bilirubin 8.4, alk-phos 94, SGOT 18, SGPT 16, total  protein 9.1, albumin 2.3, calcium 9.7.  HIV viral load less than 50.  Iron  13, total iron binding  capacity 87%, saturation 15.  Folate 12.2, iron B-12  638.  CD-4 count 130.   Microbiology results:  PCP was negative.  Blood cultures x4 on 10/19/02  positive for strep pneumonia, legionella negative, C. difficile negative.  Respiratory culture from 10/21/02 was positive for strep pneumonia.  AFB is  negative and no sings of N fungus was negative.   Scans obtained during hospital:  CT of chest showed no evidence of pulmonary  embolism.  Bilateral lower lobe and right middle lobe interstitial  infiltrates and small bilateral pleural effusions with part of the right  pleural effusion showing partial loculation.  CT of abdomen showed marked  gallbladder wall thickening, portal edema and some mild pelvic ascites.  Ultrasound of the abdomen showed small amount of free fluid in the pelvis  predominantly adjacent to the liver.  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   PROCEDURE:  1. Endotracheal intubation.  2. PICC line placed.  3. Small bowel endoscopy performed by Wilhemina Bonito. Marina Goodell, M.D. [facility] which revealed     normal proximal esophagus to duodenal second portion.  No lesions noted.  4.  Thoracentesis under radiology.   HOSPITAL COURSE:  The patient had a complicated hospital course involving  admission to Pulmonary/Critical Care Medicine shortly after admission.  1. Strep pneumococcal sepsis/pneumonia.  All cultures were obtained during     initial presentation to hospital as the patient had worsening shortness     of breath.  The patient exhibited signs of SIRS and was promptly started     on antibiotics.  She was started on Vancomycin initially and Tequin.     Infectious Disease was called in to manage above infection dealing with     patient's HIV/AIDS status.  Dr. Roxan Hockey, who follows the patient,     followed her throughout the hospitalization.  2. VDRF secondary to #1.  As mentioned above, the patient was admitted to     Pulmonary/Critical Care Medicine team and managed in intensive care unit     from 1/11 until 1/21.  During her stay in the ICU, we changed her     antibiotics to Rocephin and Clindamycin initially but was switched to     only Rocephin given sensitivities growing out from multiple cultures.     She was also placed on Epogen and iron during her stay in the ICU due to     her anemia.  The patient was extubated without problem during stay in     ICU.  3. HIV/AIDS.  The patient was resumed back on her HIV medications prior to     discharge from the hospital with further evaluation from Infectious     Disease team and Dr. Roxan Hockey.  As mentioned above, CD-4 count and viral     load were obtained.  During this hospitalization, the patient has a     complaint with her medications and will be closely followed up in the     Infectious Disease clinic.  4. Anemia.  The patient, throughout hospitalization, was anemic.  She was     worked up with iron studies which did not exhibit any significant     deficit, also hard to interpret in lieu of her illness.  Did not exude    with any bleeding events during this hospitalization.  May be anemia of     chronic  disease.  5. Nausea and vomiting prior to discharge.  As her system adjusted after  intubation, I progressed her diet appropriately, slowly.  She did have     some nausea and vomiting-type episodes considering pancreatitis.  Cause     of medications all worked up were essentially negative.  GI consult     obtained EGD which was also negative.  Prior to discharge, the patient     did not have any more nausea or vomiting and was advanced to full diet     prior to discharge.   DISPOSITION:  The patient had an extensive stay in the hospital and was  followed by multiple physicians including Critical Care and Infectious  Disease teams.  The patient is to return to the emergency department or call  outpatient  clinic if she has recurrent fever or problems with her breathing.  She is to  follow up with Dr. Roxan Hockey on 2/9 at 9:00 a.m. for further evaluation of  her medical problems.  The patient was understanding and aware of all the  above discharge plans and aware of her medications that she is suppose to be  taking upon discharge.                                               Catalina Pizza, M.D.    ZH/MEDQ  D:  01/22/2003  T:  01/22/2003  Job:  [id number]   cc:   Fleet Contras, M.D.  [address]  [place]  [place] [address]  Fax: [phone]   Rockey Situ. Flavia Shipper., M.D.  [address]  [place]  [place] [address]  Fax: [phone]

## 2011-02-25 NOTE — Group Therapy Note (Signed)
NAME:  Jennifer Boyle, KUBA NO.:  [id number]   MEDICAL RECORD NO.:  [id number]          PATIENT TYPE:  WOC   LOCATION:  [facility]                   FACILITY:  [facility]   PHYSICIAN:  Tinnie Gens, MD        DATE OF BIRTH:  1966/03/07   DATE OF SERVICE:                                    CLINIC NOTE   CHIEF COMPLAINT:  Repeat Pap smear.   HISTORY OF PRESENT ILLNESS:  The patient is a 45 year old gravida 1, para 0-  0-1-0 who has HIV disease who has a history of abnormal Pap, colpo biopsy  confirming CIN 2, status post LEEP in 2004 who is getting every 36-month  Paps.  Her last Pap was in June of this year, and was normal.  She was  instructed last time to begin annual mammography.  However, she has lost the  paperwork for this, and has not yet had her mammogram scheduled.   PHYSICAL EXAMINATION:  VITAL SIGNS:  Blood pressure is 123/79, weight 150,  pulse 71.  GENERAL:  She is a well-developed, well-nourished black female in no acute  distress.  GU:  She has normal external female genitalia.  The vagina is pink and  rugated and clean.  The cervix is nulliparous and without lesion and  anterior.  The uterus is markedly anteverted, and small.  Adnexa are without  mass or tenderness.   IMPRESSION:  1.  History of abnormal Pap with cervical intraepithelial neoplasia, grade      2, status post loop electrosurgical excision procedure in 2004.  Last      normal Pap in June of 2006.  2.  Human immunodeficiency virus disease.   PLAN:  1.  Pap smear today.  Repeat in 6 months if normal.  2.  Gave the patient information about mammography scholarship and getting      this scheduled.  She should get one annually.           ______________________________  Tinnie Gens, MD     TP/MEDQ  D:  09/23/2005  T:  09/23/2005  Job:  [id number]   cc:   Infect[facility] Disease Clinic

## 2011-02-25 NOTE — Group Therapy Note (Signed)
NAME:  Jennifer Boyle, SUTO NO.:  [id number]   MEDICAL RECORD NO.:  [id number]          PATIENT TYPE:  WOC   LOCATION:  [facility]                   FACILITY:  [facility]   PHYSICIAN:  Tinnie Gens, MD        DATE OF BIRTH:  Feb 24, 1966   DATE OF SERVICE:                                    CLINIC NOTE   CHIEF COMPLAINT:  Followup.   HISTORY OF PRESENT ILLNESS:  The patient is a 45 year old gravida 1, para 2-  0-1-0 with HIV disease who has CIN 2, and is status post LEEP in November of  2004.  She has since that time had normal Pap smears.  Her last Pap was in  October of 2005, and was normal.  She is 38, and needs to start with  mammography, and this was discussed with the patient.   PHYSICAL EXAMINATION:  GENITOURINARY:  She has normal external female  genitalia.  The vagina is pink and rugated.  The cervix is anterior without  lesion.  The uterus is retroverted and small.  The adnexa are without mass  or tenderness.   IMPRESSION:  1.  History of cervical intraepithelial neoplasia, grade 2, status post loop      electrosurgical excision procedure.  2.  Human immunodeficiency virus disease.   PLAN:  1.  Pap smear today.  2.  Followup in 6 months for another Pap if this is normal.  3.  Start mammography.  Obtain information about mammography scholarship.       TP/MEDQ  D:  03/18/2005  T:  03/18/2005  Job:  [id number]

## 2011-06-22 ENCOUNTER — Ambulatory Visit: Payer: Self-pay

## 2011-06-29 LAB — T-HELPER CELL (CD4) - (RCID CLINIC ONLY): CD4 % Helper T Cell: 19 — ABNORMAL LOW

## 2011-07-11 ENCOUNTER — Telehealth: Payer: Self-pay | Admitting: *Deleted

## 2011-07-11 LAB — T-HELPER CELL (CD4) - (RCID CLINIC ONLY): CD4 % Helper T Cell: 21 — ABNORMAL LOW

## 2011-07-11 NOTE — Telephone Encounter (Signed)
Pharmacy called to verify contact information on the patient.

## 2011-07-26 LAB — T-HELPER CELL (CD4) - (RCID CLINIC ONLY)
CD4 % Helper T Cell: 20 — ABNORMAL LOW
CD4 T Cell Abs: 460

## 2011-09-08 ENCOUNTER — Encounter: Payer: Self-pay | Admitting: *Deleted

## 2011-10-18 ENCOUNTER — Other Ambulatory Visit: Payer: Self-pay | Admitting: Infectious Disease

## 2011-10-18 DIAGNOSIS — Z79899 Other long term (current) drug therapy: Secondary | ICD-10-CM

## 2011-10-18 DIAGNOSIS — Z113 Encounter for screening for infections with a predominantly sexual mode of transmission: Secondary | ICD-10-CM

## 2011-10-18 DIAGNOSIS — B20 Human immunodeficiency virus [HIV] disease: Secondary | ICD-10-CM

## 2011-10-19 ENCOUNTER — Other Ambulatory Visit (INDEPENDENT_AMBULATORY_CARE_PROVIDER_SITE_OTHER): Payer: Self-pay

## 2011-10-19 ENCOUNTER — Other Ambulatory Visit: Payer: Self-pay | Admitting: Infectious Diseases

## 2011-10-19 DIAGNOSIS — B20 Human immunodeficiency virus [HIV] disease: Secondary | ICD-10-CM

## 2011-10-19 DIAGNOSIS — Z113 Encounter for screening for infections with a predominantly sexual mode of transmission: Secondary | ICD-10-CM

## 2011-10-19 DIAGNOSIS — Z79899 Other long term (current) drug therapy: Secondary | ICD-10-CM

## 2011-10-20 LAB — COMPREHENSIVE METABOLIC PANEL
AST: 28 U/L (ref 0–37)
Albumin: 3.6 g/dL (ref 3.5–5.2)
Alkaline Phosphatase: 145 U/L — ABNORMAL HIGH (ref 39–117)
Potassium: 4.2 mEq/L (ref 3.5–5.3)
Sodium: 138 mEq/L (ref 135–145)
Total Bilirubin: 0.5 mg/dL (ref 0.3–1.2)
Total Protein: 9 g/dL — ABNORMAL HIGH (ref 6.0–8.3)

## 2011-10-20 LAB — URINALYSIS, MICROSCOPIC ONLY
Bacteria, UA: NONE SEEN
Casts: NONE SEEN
Crystals: NONE SEEN
Squamous Epithelial / LPF: NONE SEEN

## 2011-10-20 LAB — URINALYSIS, ROUTINE W REFLEX MICROSCOPIC
Bilirubin Urine: NEGATIVE
Glucose, UA: NEGATIVE mg/dL
Hgb urine dipstick: NEGATIVE
Ketones, ur: NEGATIVE mg/dL
Nitrite: NEGATIVE
Protein, ur: NEGATIVE mg/dL
Specific Gravity, Urine: 1.025 (ref 1.005–1.030)
Urobilinogen, UA: 0.2 mg/dL (ref 0.0–1.0)
pH: 5.5 (ref 5.0–8.0)

## 2011-10-20 LAB — T-HELPER CELL (CD4) - (RCID CLINIC ONLY)
CD4 % Helper T Cell: 20 % — ABNORMAL LOW (ref 33–55)
CD4 T Cell Abs: 560 uL (ref 400–2700)

## 2011-10-20 LAB — CBC WITH DIFFERENTIAL/PLATELET
Basophils Absolute: 0 10*3/uL (ref 0.0–0.1)
Basophils Relative: 0 % (ref 0–1)
Eosinophils Absolute: 0.1 10*3/uL (ref 0.0–0.7)
Hemoglobin: 11.7 g/dL — ABNORMAL LOW (ref 12.0–15.0)
MCH: 32.7 pg (ref 26.0–34.0)
MCHC: 32.5 g/dL (ref 30.0–36.0)
Neutro Abs: 3 10*3/uL (ref 1.7–7.7)
Neutrophils Relative %: 48 % (ref 43–77)
Platelets: 338 10*3/uL (ref 150–400)
RDW: 14 % (ref 11.5–15.5)

## 2011-10-20 LAB — GC/CHLAMYDIA PROBE AMP, URINE
Chlamydia, Swab/Urine, PCR: NEGATIVE
GC Probe Amp, Urine: NEGATIVE

## 2011-10-20 LAB — LIPID PANEL
LDL Cholesterol: 105 mg/dL — ABNORMAL HIGH (ref 0–99)
Triglycerides: 121 mg/dL (ref <150)
VLDL: 24 mg/dL (ref 0–40)

## 2011-10-20 LAB — RPR

## 2011-10-21 LAB — HIV-1 RNA QUANT-NO REFLEX-BLD
HIV 1 RNA Quant: <20 copies/mL (ref <20)
HIV-1 RNA Quant, Log: <1.3 {Log} (ref <1.30)

## 2011-11-02 ENCOUNTER — Encounter: Payer: Self-pay | Admitting: Infectious Disease

## 2011-11-02 ENCOUNTER — Ambulatory Visit (INDEPENDENT_AMBULATORY_CARE_PROVIDER_SITE_OTHER): Payer: Self-pay | Admitting: Infectious Disease

## 2011-11-02 VITALS — BP 136/88 | HR 87 | Temp 97.8°F | Wt 174.0 lb

## 2011-11-02 DIAGNOSIS — B20 Human immunodeficiency virus [HIV] disease: Secondary | ICD-10-CM

## 2011-11-02 DIAGNOSIS — B009 Herpesviral infection, unspecified: Secondary | ICD-10-CM

## 2011-11-02 NOTE — Patient Instructions (Signed)
Please consider idea   Of change to   prezista 800mg  daily With   norvir 100mg  daily  And truvada

## 2011-11-02 NOTE — Assessment & Plan Note (Signed)
Not active recently

## 2011-11-02 NOTE — Assessment & Plan Note (Signed)
Consider change to prezista norvir and truvada if pt agreeable

## 2011-11-02 NOTE — Progress Notes (Signed)
  Subjective:    Patient ID: Jennifer Boyle, female    DOB: Dec 21, 1965, 46 y.o.   MRN: [id number]  HPI  Jennifer Boyle is a 46 y.o. female who is doing superbly well on her antiviral regimen, [name] and [name]) with undetectable viral load and health cd4 count. I tried to convince Jennifer Boyle to change to prezista norvir and truvada but she is reluctant to do so at this point.    Review of Systems  Constitutional: Negative for fever, chills, diaphoresis, activity change, appetite change, fatigue and unexpected weight change.  HENT: Negative for congestion, sore throat, rhinorrhea, sneezing, trouble swallowing and sinus pressure.   Eyes: Negative for photophobia and visual disturbance.  Respiratory: Negative for cough, chest tightness, shortness of breath, wheezing and stridor.   Cardiovascular: Negative for chest pain, palpitations and leg swelling.  Gastrointestinal: Negative for nausea, vomiting, abdominal pain, diarrhea, constipation, blood in stool, abdominal distention and anal bleeding.  Genitourinary: Negative for dysuria, hematuria, flank pain and difficulty urinating.  Musculoskeletal: Negative for myalgias, back pain, joint swelling, arthralgias and gait problem.  Skin: Negative for color change, pallor, rash and wound.  Neurological: Negative for dizziness, tremors, weakness and light-headedness.  Hematological: Negative for adenopathy. Does not bruise/bleed easily.  Psychiatric/Behavioral: Negative for behavioral problems, confusion, sleep disturbance, dysphoric mood, decreased concentration and agitation.       Objective:   Physical Exam  Constitutional: She is oriented to person, place, and time. She appears well-developed and well-nourished. No distress.  HENT:  Head: Normocephalic and atraumatic.  Mouth/Throat: Oropharynx is clear and moist. No oropharyngeal exudate.  Eyes: Conjunctivae and EOM are normal. Pupils are equal, round, and reactive to light. No scleral icterus.    Neck: Normal range of motion. Neck supple. No JVD present.  Cardiovascular: Normal rate, regular rhythm and normal heart sounds.  Exam reveals no gallop and no friction rub.   No murmur heard. Pulmonary/Chest: Effort normal and breath sounds normal. No respiratory distress. She has no wheezes. She has no rales. She exhibits no tenderness.  Abdominal: She exhibits no distension and no mass. There is no tenderness. There is no rebound and no guarding.  Musculoskeletal: She exhibits no edema and no tenderness.  Lymphadenopathy:    She has no cervical adenopathy.  Neurological: She is alert and oriented to person, place, and time. She has normal reflexes. She exhibits normal muscle tone. Coordination normal.  Skin: Skin is warm and dry. She is not diaphoretic. No erythema. No pallor.  Psychiatric: She has a normal mood and affect. Her behavior is normal. Judgment and thought content normal.          Assessment & Plan:  HIV DISEASE Consider change to prezista norvir and truvada if Jennifer Boyle agreeable  HSV Not active recently

## 2011-11-09 ENCOUNTER — Telehealth: Payer: Self-pay | Admitting: *Deleted

## 2011-11-09 NOTE — Telephone Encounter (Signed)
Pt's work schedule changes frequently.  She will need to call back to schedule PAP smear appt.

## 2011-11-14 ENCOUNTER — Encounter: Payer: Self-pay | Admitting: *Deleted

## 2011-11-24 ENCOUNTER — Telehealth: Payer: Self-pay | Admitting: *Deleted

## 2011-11-24 NOTE — Telephone Encounter (Signed)
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  Able to come tomorrow for PAP smear.  RN requested that an additional PAP smear appt be added to the schedule for 1100 on 11/25/11 for this pt.

## 2011-11-25 ENCOUNTER — Ambulatory Visit (INDEPENDENT_AMBULATORY_CARE_PROVIDER_SITE_OTHER): Payer: Self-pay | Admitting: *Deleted

## 2011-11-25 DIAGNOSIS — Z124 Encounter for screening for malignant neoplasm of cervix: Secondary | ICD-10-CM | POA: Insufficient documentation

## 2011-11-25 NOTE — Progress Notes (Signed)
  Subjective:     Jennifer Boyle is a 46 y.o. woman who comes in today for a  pap smear only.   Objective:    LMP 11/06/11 Pelvic Exam: Pap smear obtained.   Assessment:    Screening pap smear.   Plan:    Follow up in one year, or as indicated by Pap results.  Pt given educational materials re:  HIV and women, diet, exercise, nutrition, BSE and self-esteem.  Pt declined condoms.

## 2011-11-25 NOTE — Patient Instructions (Signed)
  I will mail your results in about a week.  Thank you for coming to the [facility] for your care.  Angelique Blonder

## 2011-11-29 ENCOUNTER — Encounter: Payer: Self-pay | Admitting: *Deleted

## 2011-12-16 ENCOUNTER — Ambulatory Visit: Payer: Self-pay

## 2011-12-26 ENCOUNTER — Ambulatory Visit: Payer: Self-pay

## 2012-02-28 ENCOUNTER — Other Ambulatory Visit: Payer: Self-pay | Admitting: *Deleted

## 2012-02-28 DIAGNOSIS — B20 Human immunodeficiency virus [HIV] disease: Secondary | ICD-10-CM

## 2012-02-28 MED ORDER — LOPINAVIR-RITONAVIR 200-50 MG PO TABS: 4.0000 | ORAL_TABLET | Freq: Every day | ORAL | Status: DC

## 2012-02-28 MED ORDER — EMTRICITABINE-TENOFOVIR DF 200-300 MG PO TABS: 1.0000 | ORAL_TABLET | Freq: Every day | ORAL | Status: DC

## 2012-04-16 ENCOUNTER — Ambulatory Visit: Payer: Self-pay

## 2012-05-02 ENCOUNTER — Other Ambulatory Visit: Payer: Self-pay | Admitting: Licensed Clinical Social Worker

## 2012-05-02 DIAGNOSIS — B20 Human immunodeficiency virus [HIV] disease: Secondary | ICD-10-CM

## 2012-05-02 MED ORDER — EMTRICITABINE-TENOFOVIR DF 200-300 MG PO TABS: 1.0000 | ORAL_TABLET | Freq: Every day | ORAL | Status: DC

## 2012-05-02 MED ORDER — LOPINAVIR-RITONAVIR 200-50 MG PO TABS: 4.0000 | ORAL_TABLET | Freq: Every day | ORAL | Status: DC

## 2012-06-29 ENCOUNTER — Other Ambulatory Visit: Payer: Self-pay | Admitting: *Deleted

## 2012-06-29 DIAGNOSIS — B20 Human immunodeficiency virus [HIV] disease: Secondary | ICD-10-CM

## 2012-06-29 MED ORDER — EMTRICITABINE-TENOFOVIR DF 200-300 MG PO TABS: 1.0000 | ORAL_TABLET | Freq: Every day | ORAL | Status: DC

## 2012-06-29 MED ORDER — LOPINAVIR-RITONAVIR 200-50 MG PO TABS: 4.0000 | ORAL_TABLET | Freq: Every day | ORAL | Status: DC

## 2012-07-31 ENCOUNTER — Other Ambulatory Visit (INDEPENDENT_AMBULATORY_CARE_PROVIDER_SITE_OTHER): Payer: No Typology Code available for payment source

## 2012-07-31 DIAGNOSIS — B20 Human immunodeficiency virus [HIV] disease: Secondary | ICD-10-CM

## 2012-07-31 DIAGNOSIS — Z79899 Other long term (current) drug therapy: Secondary | ICD-10-CM

## 2012-08-01 LAB — COMPLETE METABOLIC PANEL WITH GFR
ALT: 19 U/L (ref 0–35)
Alkaline Phosphatase: 112 U/L (ref 39–117)
GFR, Est Non African American: >89 mL/min
Sodium: 140 mEq/L (ref 135–145)
Total Bilirubin: 0.4 mg/dL (ref 0.3–1.2)
Total Protein: 9.4 g/dL — ABNORMAL HIGH (ref 6.0–8.3)

## 2012-08-01 LAB — CBC WITH DIFFERENTIAL/PLATELET
Basophils Relative: 0 % (ref 0–1)
HCT: 31.9 % — ABNORMAL LOW (ref 36.0–46.0)
Hemoglobin: 10.4 g/dL — ABNORMAL LOW (ref 12.0–15.0)
MCH: 33.3 pg (ref 26.0–34.0)
MCHC: 32.6 g/dL (ref 30.0–36.0)
Monocytes Absolute: 0.3 10*3/uL (ref 0.1–1.0)
Monocytes Relative: 6 % (ref 3–12)
Neutro Abs: 1.9 10*3/uL (ref 1.7–7.7)

## 2012-08-01 LAB — RPR

## 2012-08-01 LAB — LIPID PANEL
LDL Cholesterol: 118 mg/dL — ABNORMAL HIGH (ref 0–99)
Triglycerides: 78 mg/dL (ref <150)
VLDL: 16 mg/dL (ref 0–40)

## 2012-08-02 LAB — HIV-1 RNA QUANT-NO REFLEX-BLD: HIV 1 RNA Quant: <20 copies/mL (ref <20)

## 2012-08-15 ENCOUNTER — Encounter: Payer: Self-pay | Admitting: Infectious Disease

## 2012-08-15 ENCOUNTER — Ambulatory Visit (INDEPENDENT_AMBULATORY_CARE_PROVIDER_SITE_OTHER): Payer: No Typology Code available for payment source | Admitting: Infectious Disease

## 2012-08-15 VITALS — BP 128/81 | HR 80 | Temp 98.3°F | Wt 165.0 lb

## 2012-08-15 DIAGNOSIS — B20 Human immunodeficiency virus [HIV] disease: Secondary | ICD-10-CM

## 2012-08-15 MED ORDER — ELVITEG-COBIC-EMTRICIT-TENOFDF 150-150-200-300 MG PO TABS: 1.0000 | ORAL_TABLET | Freq: Every day | ORAL | Status: DC

## 2012-08-15 NOTE — Progress Notes (Signed)
  Subjective:    Patient ID: Jennifer Boyle, female    DOB: 1966/03/09, 46 y.o.   MRN: [id number]  HPI  Jennifer Boyle is a 46 y.o. female who is doing superbly well on his  antiviral regimen, with [place] and truvada with undetectable viral load and health cd4 count. We discussed simplifying her regimen and decided to go with STRIBILD.     Review of Systems  Constitutional: Negative for fever, chills, diaphoresis, activity change, appetite change, fatigue and unexpected weight change.  HENT: Negative for congestion, sore throat, rhinorrhea, sneezing, trouble swallowing and sinus pressure.   Eyes: Negative for photophobia and visual disturbance.  Respiratory: Negative for cough, chest tightness, shortness of breath, wheezing and stridor.   Cardiovascular: Negative for chest pain, palpitations and leg swelling.  Gastrointestinal: Negative for nausea, vomiting, abdominal pain, diarrhea, constipation, blood in stool, abdominal distention and anal bleeding.  Genitourinary: Negative for dysuria, hematuria, flank pain and difficulty urinating.  Musculoskeletal: Negative for myalgias, back pain, joint swelling, arthralgias and gait problem.  Skin: Negative for color change, pallor, rash and wound.  Neurological: Negative for dizziness, tremors, weakness and light-headedness.  Hematological: Negative for adenopathy. Does not bruise/bleed easily.  Psychiatric/Behavioral: Negative for behavioral problems, confusion, sleep disturbance, dysphoric mood, decreased concentration and agitation.       Objective:   Physical Exam  Constitutional: She is oriented to person, place, and time. She appears well-developed and well-nourished. No distress.  HENT:  Head: Normocephalic and atraumatic.  Mouth/Throat: Oropharynx is clear and moist. No oropharyngeal exudate.  Eyes: Conjunctivae normal and EOM are normal. Pupils are equal, round, and reactive to light. No scleral icterus.  Neck: Normal range of  motion. Neck supple. No JVD present.  Cardiovascular: Normal rate, regular rhythm and normal heart sounds.  Exam reveals no gallop and no friction rub.   No murmur heard. Pulmonary/Chest: Effort normal and breath sounds normal. No respiratory distress. She has no wheezes. She has no rales. She exhibits no tenderness.  Abdominal: She exhibits no distension and no mass. There is no tenderness. There is no rebound and no guarding.  Musculoskeletal: She exhibits no edema and no tenderness.  Lymphadenopathy:    She has no cervical adenopathy.  Neurological: She is alert and oriented to person, place, and time. She has normal reflexes. She exhibits normal muscle tone. Coordination normal.  Skin: Skin is warm and dry. She is not diaphoretic. No erythema. No pallor.  Psychiatric: She has a normal mood and affect. Her behavior is normal. Judgment and thought content normal.          Assessment & Plan:  HIV: start STRIBILD, bring back for recheck VL in December and visit in January

## 2012-08-23 ENCOUNTER — Other Ambulatory Visit: Payer: Self-pay | Admitting: *Deleted

## 2012-08-23 ENCOUNTER — Other Ambulatory Visit: Payer: Self-pay | Admitting: Licensed Clinical Social Worker

## 2012-08-23 ENCOUNTER — Telehealth: Payer: Self-pay | Admitting: Licensed Clinical Social Worker

## 2012-08-23 DIAGNOSIS — B20 Human immunodeficiency virus [HIV] disease: Secondary | ICD-10-CM

## 2012-08-23 MED ORDER — EMTRICITABINE-TENOFOVIR DF 200-300 MG PO TABS: 1.0000 | ORAL_TABLET | Freq: Every day | ORAL | Status: DC

## 2012-08-23 MED ORDER — DOLUTEGRAVIR SODIUM 50 MG PO TABS: 1.0000 | ORAL_TABLET | Freq: Every day | ORAL | Status: DC

## 2012-08-23 MED ORDER — LOPINAVIR-RITONAVIR 200-50 MG PO TABS: 4.0000 | ORAL_TABLET | Freq: Every day | ORAL | Status: DC

## 2012-08-23 NOTE — Telephone Encounter (Signed)
Per Dr. Zenaida Niece she can change to Tivicay and Truvada or go back to what she was taking which was Truvada and Kaletra. The patient wants to research Tivicay and she will call back if she decides to try it.

## 2012-08-23 NOTE — Telephone Encounter (Signed)
Patient was changed to stribild  At her last office visit and she is starting to have severe muscle pain, hurts to walk. She wants to changed back to what she was taking or something else. Please advise

## 2012-10-01 ENCOUNTER — Other Ambulatory Visit: Payer: Self-pay

## 2012-10-15 ENCOUNTER — Ambulatory Visit: Payer: Self-pay | Admitting: Infectious Disease

## 2012-12-17 ENCOUNTER — Telehealth: Payer: Self-pay | Admitting: *Deleted

## 2012-12-17 NOTE — Telephone Encounter (Signed)
Is she plugged into mychart? I am ok with her forgoing her visit with me IF her labs are all fine. If not we might need to make some changes

## 2012-12-17 NOTE — Telephone Encounter (Signed)
Excellent

## 2012-12-17 NOTE — Telephone Encounter (Signed)
We will get her set up in MyChart when she comes in tomorrow for labs.

## 2012-12-17 NOTE — Telephone Encounter (Signed)
Patient calling to 1- schedule PAP (appointment given 12/31/12 at 11:30), 2- schedule labs d/t medication change (appointment given 12/18/12 at 3:30). Patient thinks she may be headed back out of town at the end of the month, but thinks she will be able to make these appointments.  Patient stated she did not need to see Dr. Daiva Eves for a f/u to her labs, but appointment given for 12/18/12 at 11:15 just in case there is a need for him to see her before she leaves town. Patient requested Mychart acces. RN told her we would be able to set that up when she is here tomorrow for labs. Andree Coss, RN

## 2012-12-18 ENCOUNTER — Other Ambulatory Visit (INDEPENDENT_AMBULATORY_CARE_PROVIDER_SITE_OTHER): Payer: No Typology Code available for payment source

## 2012-12-18 ENCOUNTER — Other Ambulatory Visit: Payer: Self-pay | Admitting: Infectious Disease

## 2012-12-18 DIAGNOSIS — B20 Human immunodeficiency virus [HIV] disease: Secondary | ICD-10-CM

## 2012-12-18 LAB — CBC WITH DIFFERENTIAL/PLATELET
Eosinophils Relative: 2 % (ref 0–5)
HCT: 25.1 % — ABNORMAL LOW (ref 36.0–46.0)
Hemoglobin: 8.4 g/dL — ABNORMAL LOW (ref 12.0–15.0)
Lymphocytes Relative: 40 % (ref 12–46)
Lymphs Abs: 2.7 10*3/uL (ref 0.7–4.0)
MCV: 99.6 fL (ref 78.0–100.0)
Monocytes Absolute: 0.3 10*3/uL (ref 0.1–1.0)
Monocytes Relative: 5 % (ref 3–12)
RBC: 2.52 MIL/uL — ABNORMAL LOW (ref 3.87–5.11)
WBC: 6.7 10*3/uL (ref 4.0–10.5)

## 2012-12-19 ENCOUNTER — Encounter: Payer: Self-pay | Admitting: Infectious Disease

## 2012-12-19 ENCOUNTER — Telehealth: Payer: Self-pay | Admitting: Infectious Disease

## 2012-12-19 ENCOUNTER — Ambulatory Visit (INDEPENDENT_AMBULATORY_CARE_PROVIDER_SITE_OTHER): Payer: No Typology Code available for payment source | Admitting: Infectious Disease

## 2012-12-19 VITALS — BP 144/94 | HR 88 | Temp 98.2°F | Wt 166.0 lb

## 2012-12-19 DIAGNOSIS — D649 Anemia, unspecified: Secondary | ICD-10-CM | POA: Insufficient documentation

## 2012-12-19 DIAGNOSIS — N289 Disorder of kidney and ureter, unspecified: Secondary | ICD-10-CM

## 2012-12-19 DIAGNOSIS — Z21 Asymptomatic human immunodeficiency virus [HIV] infection status: Secondary | ICD-10-CM

## 2012-12-19 DIAGNOSIS — D509 Iron deficiency anemia, unspecified: Secondary | ICD-10-CM

## 2012-12-19 LAB — BASIC METABOLIC PANEL WITH GFR
CO2: 22 mEq/L (ref 19–32)
Glucose, Bld: 79 mg/dL (ref 70–99)
Potassium: 4.9 mEq/L (ref 3.5–5.3)
Sodium: 137 mEq/L (ref 135–145)

## 2012-12-19 LAB — CBC WITH DIFFERENTIAL/PLATELET
Eosinophils Absolute: 0.1 10*3/uL (ref 0.0–0.7)
Eosinophils Relative: 1 % (ref 0–5)
Hemoglobin: 8.1 g/dL — ABNORMAL LOW (ref 12.0–15.0)
Lymphocytes Relative: 36 % (ref 12–46)
Lymphs Abs: 2.6 10*3/uL (ref 0.7–4.0)
MCH: 33.3 pg (ref 26.0–34.0)
MCV: 97.9 fL (ref 78.0–100.0)
Monocytes Relative: 6 % (ref 3–12)
Neutrophils Relative %: 57 % (ref 43–77)
RBC: 2.43 MIL/uL — ABNORMAL LOW (ref 3.87–5.11)
WBC: 7.4 10*3/uL (ref 4.0–10.5)

## 2012-12-19 LAB — COMPLETE METABOLIC PANEL WITH GFR
AST: 23 U/L (ref 0–37)
Albumin: 3.2 g/dL — ABNORMAL LOW (ref 3.5–5.2)
Alkaline Phosphatase: 109 U/L (ref 39–117)
BUN: 22 mg/dL (ref 6–23)
Creat: 2.18 mg/dL — ABNORMAL HIGH (ref 0.50–1.10)
Potassium: 4.5 mEq/L (ref 3.5–5.3)
Total Bilirubin: 0.3 mg/dL (ref 0.3–1.2)

## 2012-12-19 LAB — LACTATE DEHYDROGENASE: LDH: 280 U/L — ABNORMAL HIGH (ref 94–250)

## 2012-12-19 LAB — T-HELPER CELL (CD4) - (RCID CLINIC ONLY): CD4 % Helper T Cell: 21 % — ABNORMAL LOW (ref 33–55)

## 2012-12-19 MED ORDER — EMTRICITABINE-TENOFOVIR DF 200-300 MG PO TABS: 1.0000 | ORAL_TABLET | ORAL | Status: DC

## 2012-12-19 MED ORDER — EMTRICITABINE-TENOFOVIR DF 200-300 MG PO TABS: 1.0000 | ORAL_TABLET | Freq: Every day | ORAL | Status: DC

## 2012-12-19 NOTE — Telephone Encounter (Signed)
Pts creatinine up to 2.18 and hct down to 25   She needs to come intoday for visit. Lets bring her in and block an acute slot  She very likely may need admission to workup her worsened renal fxn and anemia

## 2012-12-19 NOTE — Patient Instructions (Signed)
I would like you to start taking the truvada every other day for now  Continue to take tivicay daily

## 2012-12-19 NOTE — Telephone Encounter (Signed)
Scheduled patient for 1:45 today

## 2012-12-19 NOTE — Progress Notes (Signed)
Subjective:    Patient ID: Jennifer Boyle, female    DOB: [birth year], 47 y.o.   MRN: [id number]  HPI  Jennifer Boyle is a 47 y.o. female who had been  doing superbly well on her  antiviral regimen, with [place] and truvada with undetectable viral load and health cd4 count. We had simplified her STRIBILD, but she did not tolerate this and we then changed to [place] and truvada. She had done very well on this without any complaints whatsoever. Today she had routine labs performed and we noted that her serum creatinine had gone from a normal value to above 2. The BUN was still normal. In addition to this acute laboratory abnormality we noticed that her hemoglobin had dropped to 8 and hematocrit down to 25 , ALT significant drops from this fall when we last year. Therefore arrange for her be seen urgently in the clinic today.  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 She has most recently been in [place]. While in [place] she did have some fatigue while climbing the higher altitudes but did not have any acute illnesses other and one day of nausea and she had 2 episodes of emesis. This is approximately 3 weeks ago. She has had no menorrhagia and in fact no menses since January claiming that she is n ow postmenopausal. She denies having blood per rectum or tarry stools. Again she does endorse some fatigue. She is her urine output is been good and she has claimed to be drinking fluids. She has recently been on night shift work.      Review of Systems  Constitutional: Positive for fatigue. Negative for fever, chills, diaphoresis, activity change, appetite change and unexpected weight change.  HENT: Negative for congestion, sore throat, rhinorrhea, sneezing, trouble swallowing and sinus pressure.   Eyes: Negative for photophobia and visual disturbance.  Respiratory: Negative for cough, chest tightness, shortness of breath, wheezing and  stridor.   Cardiovascular: Negative for chest pain, palpitations and leg swelling.  Gastrointestinal: Negative for nausea, vomiting, abdominal pain, diarrhea, constipation, blood in stool, abdominal distention and anal bleeding.  Genitourinary: Negative for dysuria, hematuria, flank pain and difficulty urinating.  Musculoskeletal: Negative for myalgias, back pain, joint swelling, arthralgias and gait problem.  Skin: Negative for color change, pallor, rash and wound.  Neurological: Negative for dizziness, tremors, weakness and light-headedness.  Hematological: Negative for adenopathy. Does not bruise/bleed easily.  Psychiatric/Behavioral: Negative for behavioral problems, confusion, sleep disturbance, dysphoric mood, decreased concentration and agitation.       Objective:   Physical Exam  Constitutional: She is oriented to person, place, and time. She appears well-developed and well-nourished. No distress.  HENT:  Head: Normocephalic and atraumatic.  Mouth/Throat: Oropharynx is clear and moist. No oropharyngeal exudate.  Eyes: Conjunctivae and EOM are normal. Pupils are equal, round, and reactive to light. No scleral icterus.  Neck: Normal range of motion. Neck supple. No JVD present.  Cardiovascular: Normal rate, regular rhythm and normal heart sounds.  Exam reveals no gallop and no friction rub.   No murmur heard. Pulmonary/Chest: Effort normal and breath sounds normal. No respiratory distress. She has no wheezes. She has no rales. She exhibits no tenderness.  Abdominal: She exhibits no distension and no mass. There is no tenderness. There is no rebound and no guarding.  Musculoskeletal: She exhibits no edema and no tenderness.  Lymphadenopathy:    She has no cervical adenopathy.  Neurological: She is alert and  oriented to person, place, and time. She exhibits normal muscle tone. Coordination normal.  Skin: Skin is warm and dry. She is not diaphoretic. No erythema. No pallor.   Psychiatric: She has a normal mood and affect. Her behavior is normal. Judgment and thought content normal.          Assessment & Plan:  HIV: change Truvada to QOD until we have repeat BMP showing improved GFR to normal  Acute renal insufficiency: Recheck metabolic panel today recheck urinalysis with urine sodium and creatinine and urine eosinophils. His serum creatinine remains elevated we'll also check an ultrasound of the kidneys and referred to nephrology.renally dose adjust her Truvada  Microcytic anemia with drop in her hemoglobin and hematocrit: Recheck labs today and recheck iron panel. LDH She may need endoscopic evaluation although she does not have any overt evidence of gastrointestinal bleed.  I will see her back in next two weeks at the latest

## 2012-12-20 ENCOUNTER — Inpatient Hospital Stay (HOSPITAL_COMMUNITY): Payer: Self-pay

## 2012-12-20 ENCOUNTER — Inpatient Hospital Stay (HOSPITAL_COMMUNITY)
Admission: AD | Admit: 2012-12-20 | Discharge: 2012-12-24 | DRG: 684 | Disposition: A | Discharge status: Home / Self Care | Payer: No Typology Code available for payment source | Source: Ambulatory Visit | Attending: Internal Medicine | Admitting: Internal Medicine

## 2012-12-20 ENCOUNTER — Ambulatory Visit (INDEPENDENT_AMBULATORY_CARE_PROVIDER_SITE_OTHER): Payer: No Typology Code available for payment source | Admitting: Infectious Disease

## 2012-12-20 ENCOUNTER — Telehealth: Payer: Self-pay | Admitting: Infectious Disease

## 2012-12-20 ENCOUNTER — Encounter (HOSPITAL_COMMUNITY): Payer: Self-pay | Admitting: General Practice

## 2012-12-20 ENCOUNTER — Encounter: Payer: Self-pay | Admitting: Infectious Disease

## 2012-12-20 VITALS — BP 170/99 | HR 101 | Temp 98.3°F | Wt 164.0 lb

## 2012-12-20 DIAGNOSIS — N179 Acute kidney failure, unspecified: Secondary | ICD-10-CM

## 2012-12-20 DIAGNOSIS — N170 Acute kidney failure with tubular necrosis: Secondary | ICD-10-CM

## 2012-12-20 DIAGNOSIS — B20 Human immunodeficiency virus [HIV] disease: Secondary | ICD-10-CM

## 2012-12-20 DIAGNOSIS — F4323 Adjustment disorder with mixed anxiety and depressed mood: Secondary | ICD-10-CM

## 2012-12-20 DIAGNOSIS — I10 Essential (primary) hypertension: Secondary | ICD-10-CM

## 2012-12-20 DIAGNOSIS — D509 Iron deficiency anemia, unspecified: Secondary | ICD-10-CM | POA: Diagnosis present

## 2012-12-20 DIAGNOSIS — N289 Disorder of kidney and ureter, unspecified: Secondary | ICD-10-CM

## 2012-12-20 DIAGNOSIS — Z886 Allergy status to analgesic agent status: Secondary | ICD-10-CM

## 2012-12-20 DIAGNOSIS — G609 Hereditary and idiopathic neuropathy, unspecified: Secondary | ICD-10-CM | POA: Diagnosis present

## 2012-12-20 DIAGNOSIS — Z79899 Other long term (current) drug therapy: Secondary | ICD-10-CM

## 2012-12-20 DIAGNOSIS — R310 Gross hematuria: Secondary | ICD-10-CM | POA: Diagnosis present

## 2012-12-20 DIAGNOSIS — R809 Proteinuria, unspecified: Secondary | ICD-10-CM | POA: Diagnosis present

## 2012-12-20 DIAGNOSIS — D649 Anemia, unspecified: Secondary | ICD-10-CM | POA: Diagnosis present

## 2012-12-20 DIAGNOSIS — Y92009 Unspecified place in unspecified non-institutional (private) residence as the place of occurrence of the external cause: Secondary | ICD-10-CM

## 2012-12-20 DIAGNOSIS — F411 Generalized anxiety disorder: Secondary | ICD-10-CM | POA: Diagnosis present

## 2012-12-20 DIAGNOSIS — Z888 Allergy status to other drugs, medicaments and biological substances status: Secondary | ICD-10-CM

## 2012-12-20 DIAGNOSIS — T375X5A Adverse effect of antiviral drugs, initial encounter: Secondary | ICD-10-CM | POA: Diagnosis present

## 2012-12-20 DIAGNOSIS — Z21 Asymptomatic human immunodeficiency virus [HIV] infection status: Secondary | ICD-10-CM | POA: Diagnosis present

## 2012-12-20 DIAGNOSIS — R030 Elevated blood-pressure reading, without diagnosis of hypertension: Secondary | ICD-10-CM | POA: Diagnosis present

## 2012-12-20 HISTORY — DX: Human immunodeficiency virus (HIV) disease: B20

## 2012-12-20 HISTORY — DX: Personal history of other infectious and parasitic diseases: Z86.19

## 2012-12-20 HISTORY — DX: Pneumocystosis: B59

## 2012-12-20 HISTORY — DX: Polyneuropathy, unspecified: G62.9

## 2012-12-20 HISTORY — DX: Pneumonia, unspecified organism: J18.9

## 2012-12-20 HISTORY — DX: Leiomyoma of uterus, unspecified: D25.9

## 2012-12-20 HISTORY — DX: Personal history of other specified conditions: Z87.898

## 2012-12-20 LAB — URINE MICROSCOPIC-ADD ON

## 2012-12-20 LAB — CBC WITH DIFFERENTIAL/PLATELET
Blasts: 0 %
Eosinophils Absolute: 0 10*3/uL (ref 0.0–0.7)
Eosinophils Relative: 0 % (ref 0–5)
MCH: 34.7 pg — ABNORMAL HIGH (ref 26.0–34.0)
Myelocytes: 0 %
Neutro Abs: 5.4 10*3/uL (ref 1.7–7.7)
Neutrophils Relative %: 60 % (ref 43–77)
Platelets: 257 10*3/uL (ref 150–400)
RBC: 2.36 MIL/uL — ABNORMAL LOW (ref 3.87–5.11)
WBC: 9.1 10*3/uL (ref 4.0–10.5)
nRBC: 0 /100 WBC

## 2012-12-20 LAB — URINALYSIS, ROUTINE W REFLEX MICROSCOPIC
Bilirubin Urine: NEGATIVE
Glucose, UA: NEGATIVE mg/dL
Nitrite: NEGATIVE
Protein, ur: 100 mg/dL — AB
Specific Gravity, Urine: 1.015 (ref 1.005–1.030)
Urobilinogen, UA: 0.2 mg/dL (ref 0.0–1.0)
pH: 7.5 (ref 5.0–8.0)

## 2012-12-20 LAB — LACTATE DEHYDROGENASE: LDH: 287 U/L — ABNORMAL HIGH (ref 94–250)

## 2012-12-20 LAB — COMPREHENSIVE METABOLIC PANEL
Alkaline Phosphatase: 129 U/L — ABNORMAL HIGH (ref 39–117)
BUN: 23 mg/dL (ref 6–23)
Calcium: 9.8 mg/dL (ref 8.4–10.5)
GFR calc Af Amer: 27 mL/min — ABNORMAL LOW (ref >90)
Glucose, Bld: 92 mg/dL (ref 70–99)
Total Protein: 12.7 g/dL — ABNORMAL HIGH (ref 6.0–8.3)

## 2012-12-20 LAB — HIV-1 RNA QUANT-NO REFLEX-BLD: HIV 1 RNA Quant: <20 copies/mL (ref <20)

## 2012-12-20 LAB — IBC PANEL
%SAT: 40 % (ref 20–55)
UIBC: 136 ug/dL (ref 125–400)

## 2012-12-20 LAB — CREATININE, URINE, RANDOM
Creatinine, Urine: 87 mg/dL
Creatinine, Urine: 91.74 mg/dL

## 2012-12-20 LAB — URINALYSIS, MICROSCOPIC ONLY
RBC / HPF: >50 RBC/hpf — AB (ref <3)
Squamous Epithelial / LPF: NONE SEEN

## 2012-12-20 LAB — MAGNESIUM: Magnesium: 2.2 mg/dL (ref 1.5–2.5)

## 2012-12-20 MED ORDER — EMTRICITABINE 200 MG PO CAPS
200.0000 mg | ORAL_CAPSULE | ORAL | Status: DC
  Filled 2012-12-20: qty 1

## 2012-12-20 MED ORDER — TENOFOVIR DISOPROXIL FUMARATE 300 MG PO TABS
300.0000 mg | ORAL_TABLET | ORAL | Status: DC
  Filled 2012-12-20: qty 1

## 2012-12-20 MED ORDER — SODIUM CHLORIDE 0.9 % IJ SOLN
3.0000 mL | Freq: Two times a day (BID) | INTRAMUSCULAR | Status: DC
  Administered 2012-12-21 – 2012-12-24 (×5): 3 mL via INTRAVENOUS

## 2012-12-20 MED ORDER — SODIUM CHLORIDE 0.9 % IV SOLN: 250.0000 mL | INTRAVENOUS | Status: DC | PRN

## 2012-12-20 MED ORDER — HEPARIN SODIUM (PORCINE) 5000 UNIT/ML IJ SOLN
5000.0000 [IU] | Freq: Three times a day (TID) | INTRAMUSCULAR | Status: DC
  Filled 2012-12-20 (×14): qty 1

## 2012-12-20 MED ORDER — DOLUTEGRAVIR SODIUM 50 MG PO TABS
1.0000 | ORAL_TABLET | Freq: Every day | ORAL | Status: DC
  Filled 2012-12-20: qty 1

## 2012-12-20 MED ORDER — SODIUM CHLORIDE 0.9 % IJ SOLN: 3.0000 mL | INTRAMUSCULAR | Status: DC | PRN

## 2012-12-20 NOTE — H&P (Signed)
Hospital Admission Note Date: 12/20/2012  Patient name: Jennifer Boyle Medical record number: [id number] Date of birth: 11-24-1965 Age: 47 y.o. Gender: female PCP: Jennifer Lav, MD  Medical Service: Internal Medicine Teaching Service  Attending physician:  Jennifer Boyle   1st Contact:  Jennifer Boyle  Pager:[phone] 2nd Contact:  Jennifer Boyle   Pager:[phone] After 5 pm or weekends: 1st Contact:      Pager: [phone] 2nd Contact:      Pager: [phone]  Chief Complaint: Abnormal laboratory results  History of Present Illness: Jennifer Boyle is a very pleasant 47 year old woman, immigrant from [place], with PMH of HIV (undetectable viral load on 10/13) who presented to the ID clinic today following routine laboratory values from 3/12 revealing acute kidney failure and normocytic anemia. She was evaluated today by Jennifer Boyle and instructed to come to the [facility] for direct admission. She states that she has been feeling tired lately with decreased appetite but denies recent N/V, or diarrhea. She had two episodes of vomiting one month ago that resolved on its own. 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 She denies shortness of breath, chest pain, heart palpitations, abdominal pain, dysuria, hematuria, hematochezia, or melena.   Meds: Medications Prior to Admission  Medication Sig Dispense Refill  . Dolutegravir Sodium (TIVICAY) 50 MG TABS Take 1 tablet by mouth daily.      . [DISCONTINUED] aspirin 81 MG tablet Take 81 mg by mouth daily.      . [DISCONTINUED] emtricitabine-tenofovir (TRUVADA) 200-300 MG per tablet Take 1 tablet by mouth every other day.       Allergies: Allergies as of 12/20/2012 - Review Complete 12/20/2012  Allergen Reaction Noted  . Aspirin    . Efavirenz  08/07/2006   No past medical history on file. No past surgical history on file. No family history on file. History   Social History  . Marital Status: Single    Spouse Name: N/A    Number of Children: N/A   . Years of Education: N/A   Occupational History  . Not on file.   Social History Main Topics  . Smoking status: Never Smoker   . Smokeless tobacco: Not on file  . Alcohol Use: Not on file  . Drug Use: Not on file  . Sexually Active: Not on file   Other Topics Concern  . Not on file   Social History Narrative  . No narrative on file    Review of Systems: Pertinent items are noted in HPI.  Physical Exam: Blood pressure 152/78, pulse 95, temperature 98.4 F (36.9 C), temperature source Oral, resp. rate 18, weight 165 lb 2 oz (74.9 kg), SpO2 99.00%. Vitals reviewed. General: resting in bed, in NAD HEENT: PERRL, EOMI, no scleral icterus Cardiac: RRR, no rubs, or gallops. 2/6 crescendo murmur heard best at the RUSB Pulm: clear to auscultation bilaterally, no wheezes, rales, or rhonchi Abd: soft, nontender, nondistended, BS present Ext: warm and well perfused, no pedal edema Neuro: alert and oriented X3, cranial nerves II-XII grossly intact, strength and sensation to light touch equal in bilateral upper and lower extremities   Lab results: Basic Metabolic Panel:  Recent Labs  14/78/29 1552 12/19/12 1540  NA 138 137  K 4.5 4.9  CL 108 104  CO2 21 22  GLUCOSE 78 79  BUN 22 30*  CREATININE 2.18* 2.44*  CALCIUM 9.3 9.3   Liver Function Tests:  Recent Labs  12/18/12 1552  AST 23  ALT 19  ALKPHOS 109  BILITOT 0.3  PROT 10.8*  ALBUMIN 3.2*   CBC:  Recent Labs  12/18/12 1552 12/19/12 1540  WBC 6.7 7.4  NEUTROABS 3.6 4.2  HGB 8.4* 8.1*  HCT 25.1* 23.8*  MCV 99.6 97.9  PLT 289 256   Anemia Panel:  Recent Labs  12/19/12 1541  TIBC 226*  IRON 90   Urinalysis:  Recent Labs  12/19/12 1642 12/20/12 1844  COLORURINE YELLOW YELLOW  LABSPEC 1.015 1.015  PHURINE 7.0 7.5  GLUCOSEU NEG NEGATIVE  HGBUR LARGE* LARGE*  BILIRUBINUR NEG NEGATIVE  KETONESUR NEG NEGATIVE  PROTEINUR 100* 100*  UROBILINOGEN 0.2 0.2  NITRITE NEG NEGATIVE  LEUKOCYTESUR  TRACE* TRACE*    Imaging results:  No results found.  Other results: EKG: Pending  Assessment & Plan by Problem:  Acute renal failure: Baseline creatinine at ~0.77. Creatinine on 3/11 at 2.18, up to 2.44 the next day with BUN only mildly increased to 30. She denies N/V, or decrease intake of fluids. Her urinalysis shows protein >100, with large Hgb and numerous RBC. This is unlikely prerenal azotemia with FeNa of 1.2. She denies decreased urinary output. Etiology unclear, could be secondary to intrinsic renal injury secondary to her HIV medication (tenofivir). Renal ultrasound with normal appearing kidneys and no hydronephrosis.   -Admit to Inpatient -ID on board, will follow on Dr. Zenaida Niece Boyle's recommendations -Nephrology consult -Will consult Neprhology -urine eosinophils -urine Na and Cr again -Diet renal -CMP now -CBC now -Mg, Phospurus -BMET in AM -Strict I &O -Daily Weights -Continues pulse ox -Renal Ultrasound -UA  Normocytic anemia. Baseline Hgb of ~10 but dropped to 8.1 on 3/12. She denies overt bleeding. Hematuria per UA. Etiology unclear.  -LDH -Anemia panel -Repeat CBC -CBC in AM  HIV. Well controlled. She is followed by Jennifer Boyle at the ID clinic. Repeat VL pending, HLA pending. Pt currently on truvada every other day. Will hold Dolutegravir, and emtriva (Truvada not on formulary) and Tenofivir, due to concerns of possible kidney injury with this medication.   -f/u on ID recommendations.  -Holding ARV for now  HTN. BP of 170/99 on presentation, likely secondary to anxiety. She is asymptomatic. Will continue monitoring for now.   DVT prophylaxis: heparin TID  FEN NSL Replete as needed Renal diet   Dispo: Disposition is deferred at this time, awaiting improvement of current medical problems. Anticipated discharge in approximately 1-2 day(s).   The patient does have a current PCP Jennifer Lav, MD), therefore will not be requiring OPC follow-up after  discharge.   The patient does not have transportation limitations that hinder transportation to clinic appointments.  Signed: Ky Boyle 12/20/2012, 7:14 PM

## 2012-12-20 NOTE — Progress Notes (Signed)
Subjective:    Patient ID: Jennifer Boyle, female    DOB: 07/17/1966, 47 y.o.   MRN: [id number]  HPI  Subjective:    Patient ID: Jennifer Boyle, female    DOB: 24-Jul-1966, 47 y.o.   MRN: [id number]  HPI  Jennifer Boyle is a 47 y.o. female who had been  doing superbly well on her  antiviral regimen, with [place] and truv[place] with undetectable viral load and health cd4 count. We had simplified her STRIBILD, but she did not tolerate this and we then changed to [place] and truvada.   She had done very well on this without any complaints whatsoever.  2 days ago she had routine labs performed and we noted that her serum creatinine had gone from a normal value to above 2. The BUN was still normal. In addition to this acute laboratory abnormality we noticed that her hemoglobin had dropped to 8 and hematocrit down to 25 , ALT significant drops from this fall when we last year. 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 She has most recently been in [place]. While in [place] she did have some fatigue while climbing the higher altitudes but did not have any acute illnesses other and one day of nausea and she had 2 episodes of emesis. This is approximately 3 weeks ago. She has had no menorrhagia and in fact no menses since January claiming that she is n ow postmenopausal. She denies having blood per rectum or tarry stools. Again she does endorse some fatigue. She is her urine output is been good and she has claimed to be drinking fluids.   We rechecked her labs and CR was worse in 24 hours to 2.44 with mildly elevated BUN. FENA was 1.2. UA showed 100 protein. I became concerned for TNF toxicity and by accelerated unexplained renal dysfunciton and arranged for her to come to clinic for re-eval and admission to B service.  She is very anxious about cost of admission as she is in  [employer] (she is a travelling Charity fundraiser) but I felt she really needed to come in for eval and rx urgently in hospital.      Review of Systems  Constitutional: Positive for fatigue. Negative for fever, chills, diaphoresis, activity change, appetite change and unexpected weight change.  HENT: Negative for congestion, sore throat, rhinorrhea, sneezing, trouble swallowing and sinus pressure.   Eyes: Negative for photophobia and visual disturbance.  Respiratory: Negative for cough, chest tightness, shortness of breath, wheezing and stridor.   Cardiovascular: Negative for chest pain, palpitations and leg swelling.  Gastrointestinal: Negative for nausea, vomiting, abdominal pain, diarrhea, constipation, blood in stool, abdominal distention and anal bleeding.  Genitourinary: Negative for dysuria, hematuria, flank pain and difficulty urinating.  Musculoskeletal: Negative for myalgias, back pain, joint swelling, arthralgias and gait problem.  Skin: Negative for color change, pallor, rash and wound.  Neurological: Negative for dizziness, tremors, weakness and light-headedness.  Hematological: Negative for adenopathy. Does not bruise/bleed easily.  Psychiatric/Behavioral: Negative for behavioral problems, confusion, sleep disturbance, dysphoric mood, decreased concentration and agitation.       Objective:   Physical Exam  Constitutional: She is oriented to person, place, and time. She appears well-developed and well-nourished. No distress.  HENT:  Head: Normocephalic and atraumatic.  Mouth/Throat: Oropharynx is clear and moist. No oropharyngeal exudate.  Eyes: Conjunctivae and EOM are normal. Pupils are equal,  round, and reactive to light. No scleral icterus.  Neck: Normal range of motion. Neck supple. No JVD present.  Cardiovascular: Normal rate, regular rhythm and normal heart sounds.  Exam reveals no gallop and no friction rub.   No murmur heard. Pulmonary/Chest: Effort normal and  breath sounds normal. No respiratory distress. She has no wheezes. She has no rales. She exhibits no tenderness.  Abdominal: She exhibits no distension and no mass. There is no tenderness. There is no rebound and no guarding.  Musculoskeletal: She exhibits no edema and no tenderness.  Lymphadenopathy:    She has no cervical adenopathy.  Neurological: She is alert and oriented to person, place, and time. She exhibits normal muscle tone. Coordination normal.  Skin: Skin is warm and dry. She is not diaphoretic. No erythema. No pallor.  Psychiatric: She has a normal mood and affect. Her behavior is normal. Judgment and thought content normal.          Assessment & Plan:  HIV: change Truvada to QOD until we have repeat BMP showing improved GFR to normal  Acute renal insufficiency: Recheck metabolic panel today recheck urinalysis with urine sodium and creatinine and urine eosinophils. His serum creatinine remains elevated we'll also check an ultrasound of the kidneys and referred to nephrology.renally dose adjust her Truvada  Microcytic anemia with drop in her hemoglobin and hematocrit: Recheck labs today and recheck iron panel. LDH She may need endoscopic evaluation although she does not have any overt evidence of gastrointestinal bleed.  I will see her back in next two weeks at the latest   Review of Systems  Constitutional: Positive for fatigue. Negative for fever, chills, diaphoresis, activity change, appetite change and unexpected weight change.  HENT: Negative for congestion, sore throat, rhinorrhea, sneezing, trouble swallowing and sinus pressure.   Eyes: Negative for photophobia and visual disturbance.  Respiratory: Negative for cough, chest tightness, shortness of breath, wheezing and stridor.   Cardiovascular: Negative for chest pain, palpitations and leg swelling.  Gastrointestinal: Negative for nausea, vomiting, abdominal pain, diarrhea, constipation, blood in stool, abdominal  distention and anal bleeding.  Genitourinary: Negative for dysuria, hematuria, flank pain and difficulty urinating.  Musculoskeletal: Negative for myalgias, back pain, joint swelling, arthralgias and gait problem.  Skin: Negative for color change, pallor, rash and wound.  Neurological: Negative for dizziness, tremors, weakness and light-headedness.  Hematological: Negative for adenopathy. Does not bruise/bleed easily.  Psychiatric/Behavioral: Negative for behavioral problems, confusion, sleep disturbance, dysphoric mood, decreased concentration and agitation. The patient is nervous/anxious.        Objective:   Physical Exam  Constitutional: She is oriented to person, place, and time. She appears well-developed and well-nourished. No distress.  HENT:  Head: Normocephalic and atraumatic.  Eyes: Conjunctivae and EOM are normal.  Neck: Normal range of motion. Neck supple.  Cardiovascular: Normal rate, regular rhythm and normal heart sounds.  Exam reveals no friction rub.   Pulmonary/Chest: Effort normal and breath sounds normal. She has no wheezes.  Abdominal: She exhibits no distension and no mass. There is no tenderness. There is no rebound and no guarding.  Neurological: She is alert and oriented to person, place, and time. She exhibits abnormal muscle tone. Coordination normal.  Skin: Skin is warm and dry. She is not diaphoretic.  Psychiatric: Her mood appears anxious.          Assessment & Plan:   Renal insufficiency: I am concerned for TNF toxicity. She hasnot had Truvada yesterday night per my recs. Tivicay can  cause a slight elevation in creatinine via inhibition of tubular secretion but NOT to level seen in this pt  --We should withold her ARV for now.  --She needs repeat BMP w GFR, serum phosphorus, -- repat UA and microablumin creatinine,  --Urine eos are pending apparently.  -I would also get renal US, -Give genoerous  IVF --strict ins and outs and  --COnsult  Nephrology  HIV: has been very well controlled. Repeat VL still pending. HLA [id number] pending and if NEg will change to abacavir based reigmen  HTN; mostly acutely anxiety driven  Financial hardship: assured her we would work to minimize costs to her for this admission

## 2012-12-20 NOTE — Telephone Encounter (Signed)
Patients's renal fxn is cotinuing to worsen. THis may be TNF toxicity.  urine protein was at 100.  and serum phoshate were not obtained but TNF toxicity is VERY high on my differential.  Given her rapid worsening of her renal fxn even in last 24 hours I think she needs admission for IVF, formal eval by nephrology US renal  She needs to stop her truvada completely and while this is being stopped her tivicay as well

## 2012-12-21 ENCOUNTER — Encounter (HOSPITAL_COMMUNITY): Payer: Self-pay | Admitting: Internal Medicine

## 2012-12-21 DIAGNOSIS — D649 Anemia, unspecified: Secondary | ICD-10-CM

## 2012-12-21 LAB — CBC
HCT: 22.3 % — ABNORMAL LOW (ref 36.0–46.0)
Hemoglobin: 7.6 g/dL — ABNORMAL LOW (ref 12.0–15.0)
MCH: 33.3 pg (ref 26.0–34.0)
RBC: 2.28 MIL/uL — ABNORMAL LOW (ref 3.87–5.11)

## 2012-12-21 LAB — BASIC METABOLIC PANEL
BUN: 21 mg/dL (ref 6–23)
Chloride: 108 mEq/L (ref 96–112)
GFR calc Af Amer: 27 mL/min — ABNORMAL LOW (ref >90)
Potassium: 4.6 mEq/L (ref 3.5–5.1)
Sodium: 140 mEq/L (ref 135–145)

## 2012-12-21 LAB — CK TOTAL AND CKMB (NOT AT ARMC)
CK, MB: 1.2 ng/mL (ref 0.3–4.0)
Relative Index: 0.5 (ref 0.0–2.5)

## 2012-12-21 LAB — IRON AND TIBC
Iron: 85 ug/dL (ref 42–135)
UIBC: 141 ug/dL (ref 125–400)

## 2012-12-21 LAB — MICROALBUMIN / CREATININE URINE RATIO
Creatinine, Urine: 105.1 mg/dL
Microalb, Ur: 48.81 mg/dL — ABNORMAL HIGH (ref 0.00–1.89)

## 2012-12-21 LAB — VITAMIN B12: Vitamin B-12: 595 pg/mL (ref 211–911)

## 2012-12-21 LAB — PATHOLOGIST SMEAR REVIEW

## 2012-12-21 MED ORDER — SODIUM CHLORIDE 0.9 % IV SOLN
INTRAVENOUS | Status: DC
  Administered 2012-12-21 – 2012-12-22 (×3): via INTRAVENOUS

## 2012-12-21 MED ORDER — SODIUM CHLORIDE 0.9 % IV SOLN: 250.0000 mL | INTRAVENOUS | Status: AC | PRN

## 2012-12-21 NOTE — Progress Notes (Signed)
Utilization review completed. Bertha Stanfill, RN, BSN. 

## 2012-12-21 NOTE — Consult Note (Signed)
[facility] - CONSULT NOTE Resident Note    Please see below for attending addendum to resident note.   Date: 12/21/2012                  Patient Name:  Jennifer Boyle  MRN: [id number]  DOB: 1966/02/06  Age / Sex: 47 y.o., female         PCP: Paulette Blanch Dam                 Referring Physician: Lars Mage, MD                 Reason for Consult: Acute Kidney Injury            History of Present Illness: Patient is a 47 y.o. female with a PMHx of HIV, who was admitted to [facility] on 12/20/2012 for evaluation of elevated creatinine level on follow-up with her Infectious Disease doctor Daiva Eves). Pt states that she has not had any recent changes to her ARV therapy which includes Truvada and Tivacay.  She denies recent illness but states that she has been more fatigued.  She denies dysuria, hematuria, abdominal or flank pain, chest pain or shortness of breath. 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   Medications: Outpatient medications: Prescriptions prior to admission  Medication Sig Dispense Refill  . Dolutegravir Sodium (TIVICAY) 50 MG TABS Take 1 tablet by mouth daily.      . [DISCONTINUED] aspirin 81 MG tablet Take 81 mg by mouth daily.      . [DISCONTINUED] emtricitabine-tenofovir (TRUVADA) 200-300 MG per tablet Take 1 tablet by mouth every other day.        Current medications: Current Facility-Administered Medications  Medication Dose Route Frequency Provider Last Rate Last Dose  . 0.9 %  sodium chloride infusion  250 mL Intravenous PRN Lorretta Harp, MD      . heparin injection 5,000 Units  5,000 Units Subcutaneous Q8H Lorretta Harp, MD      . sodium chloride 0.9 % injection 3 mL  3 mL Intravenous Q12H Lorretta Harp, MD      . sodium chloride 0.9 % injection 3 mL  3 mL Intravenous PRN Lorretta Harp, MD          Allergies: Allergies  Allergen Reactions  . Aspirin     Upset stomach   . Efavirenz     hives      Past Medical History: Past Medical History   Diagnosis Date  . Heart murmur   . Pneumonia 10/2002; 2010    Required INTUBATION for streptococcal pneumonia/septicemia (10/2002)  . HIV disease DX: 01/2002  . Fibroid uterus   . 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 Peripheral neuropathy      Past Surgical History: Past Surgical History  Procedure Laterality Date  . No past surgeries       Family History: History reviewed. No pertinent family history.   Social History:  reports that she has never smoked. She has never used smokeless tobacco. She reports that she does not drink alcohol or use illicit drugs.   Review of Systems: Constitutional:  Denies fever, chills, diaphoresis, Endorses decreased appetite and fatigue.  HEENT:  Denies photophobia, eye pain, redness, hearing loss, ear pain, congestion, sore throat, rhinorrhea, sneezing, mouth sores, trouble swallowing, neck pain, neck stiffness and tinnitus.  Respiratory: Denies SOB, DOE, cough, chest tightness, and wheezing.  Cardiovascular: Denies chest pain, palpitations and leg swelling.  Gastrointestinal: Denies nausea, vomiting recently, abdominal pain, diarrhea, constipation, blood in stool and abdominal distention.  Genitourinary: Denies dysuria, urgency, frequency, hematuria, flank pain and difficulty urinating.  Musculoskeletal: Denies myalgias, back pain, joint swelling, arthralgias and gait problem.   Skin: Denies pallor, rash and wound.  Neurological: Denies dizziness, seizures, syncope, weakness, light-headedness, numbness and headaches.      Vital Signs: Blood pressure 128/75, pulse 84, temperature 98.1 F (36.7 C), temperature source Oral, resp. rate 18, height 5\' 3"  (1.6  m), weight 167 lb 1.7 oz (75.8 kg), SpO2 100.00%.  Weight trends: Filed Weights   12/20/12 1716 12/21/12 0606  Weight: 165 lb 2 oz (74.9 kg) 167 lb 1.7 oz (75.8 kg)    Physical Exam: General: Well-developed, well-nourished, African female, in no acute distress; conversant HEENT: Normocephalic, atraumatic.  Lungs: Normal respiratory effort. Clear to auscultation bilaterally from apices to bases without crackles or wheezes appreciated. Heart: normal rate, regular rhythm, +SEM  Abdomen: BS normoactive. Soft, Nondistended, non-tender. No masses or organomegaly appreciated. Extremities: Trace BLE edema, distal pulses intact Neurologic: grossly non-focal, alert and oriented x3, appropriate and cooperative throughout examination.  Lab results: Basic Metabolic Panel:  Recent Labs Lab 12/19/12 1540 12/20/12 2022 12/21/12 0610  NA 137 137 140  K 4.9 4.1 4.6  CL 104 105 108  CO2 22 19 22   GLUCOSE 79 92 93  BUN 30* 23 21  CREATININE 2.44* 2.34* 2.32*  CALCIUM 9.3 9.8 9.7  MG  --  2.2  --   PHOS  --  3.1  --     Liver Function Tests:  Recent Labs Lab 12/18/12 1552 12/20/12 2022  AST 23 26  ALT 19 20  ALKPHOS 109 129*  BILITOT 0.3 0.2*  PROT 10.8* 12.7*  ALBUMIN 3.2* 2.8*   CBC:  Recent Labs Lab 12/18/12 1552 12/19/12 1540 12/20/12 2022 12/21/12 0610  WBC 6.7 7.4 9.1 6.1  NEUTROABS 3.6 4.2 5.4  --   HGB 8.4* 8.1* 8.2* 7.6*  HCT 25.1* 23.8* 23.3* 22.3*  MCV 99.6 97.9 98.7 97.8  PLT 289 256 257 227     Urinalysis:  Recent Labs  12/19/12 1642 12/20/12 1844  COLORURINE YELLOW YELLOW  LABSPEC 1.015 1.015  PHURINE 7.0 7.5  GLUCOSEU NEG NEGATIVE  HGBUR LARGE* LARGE*  BILIRUBINUR NEG NEGATIVE  KETONESUR NEG NEGATIVE  PROTEINUR 100* 100*  UROBILINOGEN 0.2 0.2  NITRITE NEG NEGATIVE  LEUKOCYTESUR TRACE* TRACE*      Imaging: US Renal  12/20/2012  *RADIOLOGY REPORT*  Clinical Data: Acute renal insufficiency  RENAL/URINARY TRACT ULTRASOUND COMPLETE   Comparison:  CT 09/15/2003  Findings:  Right Kidney:  11.1 cm.  Echogenic cortex.  Normal cortical thickness.  No obstruction.  Left Kidney:  11.3 cm.  Negative for obstruction.  Echogenic cortex with normal thickness.  Bladder:  Small urinary bladder.  IMPRESSION: Echogenic kidneys with normal cortical thickness.  No hydronephrosis.   Original Report Authenticated By: Janeece Riggers, M.D.       Assessment & Plan: Pt is a 47 y.o. yo female with a PMHX of HIV, was admitted to [facility] on 12/20/2012 with elevated creatinine level.    1. Acute Kidney Injury: likely intrinsic, ATN secondary to nephrotoxicity in setting of HIV on emtricitabine-tenofivir (  Truvada)  and dolutegravir (tivicay) therapy. Creatinine level elevated from baseline ~0.7 (on admission Cr 2.44 --> 2.32 today), UOP 2.1L over past day, 750 cc thus far today. Renal US unremarkable. Urine with proteinuria, trace leukocytes and large Hgb.  Pre-renal or post-renal etiology unlikely given FENa >1%, pt euvolemic with appropriate urine outpt and no hydronephrosis on renal ultrasound. No acid-base or electrolyte disturbances, no DM, pre-exiting CKD or cirrhosis. Per Up To Date dolutegravir (boosting agent) can interfere with tubular secretion of creatinine causing decline in GFR/ elevated reatinine levels without true decline in renal function although rise is typically in range of 0.05-0.2. Pt's last creatine level check prior to admission was 0.77 in 09/2012 before start of dolutegravir. -cont to hold HIV meds    Recent Labs Lab 12/18/12 1552 12/19/12 1540 12/20/12 2022 12/21/12 0610  CREATININE 2.18* 2.44* 2.34* 2.32*     2. Normocytic Anemia: Hgb decreased from baseline ~10 -anemia panel pending -cont to monitor transfuse per primary team  Recent Labs Lab 12/18/12 1552 12/19/12 1540 12/20/12 2022 12/21/12 0610  HGB 8.4* 8.1* 8.2* 7.6*    3. HIV: well controlled, pt reports being on Truvada for ~ 6 years, and starting tivicay in  Dec 2013 -HIV meds held  Patient history and plan of care reviewed with attending, Dr. Allena Katz.   Manuela Schwartz, MD  PGYII, Internal Medicine Resident 12/21/2012, 10:46 AM  I have personally seen and examined this patient and agree with the assessment/plan as outlined above by Bosie Clos MD (PGY2). Patient with underlying (well controlled ) HIV infection admitted with unexplained ARF. Will send off GN serologies given proteinuria and hematuria to rule out MPGN, ANCA vasculitis and SLE. Suspected HAART nephrotoxicity- ARVs currently on hold. If renal function worsens and diagnosis unknown- may need renal biopsy. Eual Lindstrom K.,MD 12/21/2012 12:45 PM

## 2012-12-21 NOTE — H&P (Signed)
Internal Medicine Attending Admission Note Date: 12/21/2012  Patient name: Jennifer Boyle Medical record number: [id number] Date of birth: 1965-11-04 Age: 47 y.o. Gender: female  I saw and evaluated the patient. I reviewed the resident's note and I agree with the resident's findings and plan as documented in the resident's note.  Chief Complaint(s): abNormal Creatinine  History - key components related to admission: Patient is a 47 year old female with past medical history most significant for well-controlled HIV who was seen by Dr. Algis Liming is a regular followup after he started her on Truvada and Tivacay recently in December 2013. Patient's followup labs suggested acute kidney injury and hence patient was advised to be admitted for further workup. Patient denies any complaints at this time except 1 episode of feeling short of breath while she was [place]. Patient denies any complaints other than what is noted above. Patient has been compliant with all her medications and denies using over-the-counter pain medications.  15 point review of system was negative except what is noted in the history of present illness.  Past medical history, past surgical history, medications, social history and family history was reviewed and is as per history of present illness.    Physical Exam - key components related to admission:  Filed Vitals:   12/20/12 1716 12/20/12 1900 12/20/12 2058 12/21/12 0606  BP: 152/78  132/64 128/75  Pulse: 95  91 84  Temp: 98.4 F (36.9 C)  98.4 F (36.9 C) 98.1 F (36.7 C)  TempSrc: Oral  Oral Oral  Resp: 18  18 18   Height:  5\' 3"  (1.6 m)    Weight: 165 lb 2 oz (74.9 kg)   167 lb 1.7 oz (75.8 kg)  SpO2: 99%  100% 100%   Physical Exam: General: Vital signs reviewed and noted. Well-developed, well-nourished, in no acute distress; alert, appropriate and cooperative throughout examination.  Head: Normocephalic, atraumatic.  Eyes: PERRL,  EOMI, No signs of anemia or jaundince.  Nose: Mucous membranes moist, not inflammed, nonerythematous.  Throat: Oropharynx nonerythematous, no exudate appreciated.   Neck: No deformities, masses, or tenderness noted.Supple, No carotid Bruits, no JVD.  Lungs:  Normal respiratory effort. Clear to auscultation BL without crackles or wheezes.  Heart: RRR. S1 and S2 normal without gallop, or rubs.I could not hear any murmur on my exam as noted by the resident   Abdomen:  BS normoactive. Soft, Nondistended, non-tender.  No masses or organomegaly.  Extremities: No pretibial edema.  Neurologic: A&O X3, CN II - XII are grossly intact. Motor strength is 5/5 in the all 4 extremities, Sensations intact to light touch, Cerebellar signs negative.  Skin: No visible rashes, scars.    Lab results:   Basic Metabolic Panel:  Recent Labs  40/98/11 1540 12/20/12 2022 12/21/12 0610  NA 137 137 140  K 4.9 4.1 4.6  CL 104 105 108  CO2 22 19 22   GLUCOSE 79 92 93  BUN 30* 23 21  CREATININE 2.44* 2.34* 2.32*  CALCIUM 9.3 9.8 9.7  MG  --  2.2  --   PHOS  --  3.1  --    Liver Function Tests:  Recent Labs  12/18/12 1552 12/20/12 2022  AST 23 26  ALT 19 20  ALKPHOS 109 129*  BILITOT 0.3 0.2*  PROT 10.8* 12.7*  ALBUMIN 3.2* 2.8*   CBC:  Recent Labs  12/19/12 1540 12/20/12 2022 12/21/12 0610  WBC 7.4 9.1 6.1  NEUTROABS 4.2 5.4  --  HGB 8.1* 8.2* 7.6*  HCT 23.8* 23.3* 22.3*  MCV 97.9 98.7 97.8  PLT 256 257 227   Cardiac Enzymes:  Recent Labs  12/21/12 1055  CKTOTAL 228*  CKMB 1.2   Anemia Panel:  Recent Labs  12/20/12 2022 12/21/12 0610  VITAMINB12  --  595  FOLATE  --  16.9  FERRITIN  --  397*  TIBC  --  226*  IRON  --  85  RETICCTPCT 1.8  --     Recent Labs  12/19/12 1642 12/20/12 1844  COLORURINE YELLOW YELLOW  LABSPEC 1.015 1.015  PHURINE 7.0 7.5  GLUCOSEU NEG NEGATIVE  HGBUR LARGE* LARGE*  BILIRUBINUR NEG NEGATIVE  KETONESUR NEG NEGATIVE  PROTEINUR 100*  100*  UROBILINOGEN 0.2 0.2  NITRITE NEG NEGATIVE  LEUKOCYTESUR TRACE* TRACE*     Imaging results:  US Renal  12/20/2012  *RADIOLOGY REPORT*  Clinical Data: Acute renal insufficiency  RENAL/URINARY TRACT ULTRASOUND COMPLETE  Comparison:  CT 09/15/2003  Findings:  Right Kidney:  11.1 cm.  Echogenic cortex.  Normal cortical thickness.  No obstruction.  Left Kidney:  11.3 cm.  Negative for obstruction.  Echogenic cortex with normal thickness.  Bladder:  Small urinary bladder.  IMPRESSION: Echogenic kidneys with normal cortical thickness.  No hydronephrosis.   Original Report Authenticated By: Janeece Riggers, M.D.     Assessment & Plan by Problem:  Principal Problem:   Acute renal insufficiency Active Problems:   HIV DISEASE   Microcytic anemia  Patient is a 47 year old female with past medical history most significant for well-controlled HIV who was admitted from infectious disease clinic after found to have elevated creatinine.  Patient denies any complaints at this time.  1. acute kidney injury: This is most likely ATN as fractional excretion of sodium was found to be 1.2%. Patient also has hematuria and proteinuria on urine analysis which makes glomerulonephritis unlikely diagnosis as well. Postrenal causes have been ruled out with a normal renal ultrasound. Prerenal dehydration his a possibility as one fractional excretion of sodium value was less than 1%. Renal consult is much appreciated. HIV medications are on hold at this time for possible renal toxicity as a cause of her ATN. Given that patient has anemia, acute renal failure and high protein albumin gap, we will also obtain SPEP, UPEP and reflex to rule out multiple myeloma although it's low on differential. -Start IV fluids at 100 cc an hour -Follow labs as per renal -Continue to hold antiretroviral therapy  Lars Mage MD Faculty-Internal Medicine Residency Program

## 2012-12-21 NOTE — Progress Notes (Signed)
Subjective: She has no complaints today, she denies gross hematuria.   Objective: Vital signs in last 24 hours: Filed Vitals:   12/20/12 1716 12/20/12 1900 12/20/12 2058 12/21/12 0606  BP: 152/78  132/64 128/75  Pulse: 95  91 84  Temp: 98.4 F (36.9 C)  98.4 F (36.9 C) 98.1 F (36.7 C)  TempSrc: Oral  Oral Oral  Resp: 18  18 18   Height:  5\' 3"  (1.6 m)    Weight: 165 lb 2 oz (74.9 kg)   167 lb 1.7 oz (75.8 kg)  SpO2: 99%  100% 100%   Weight change:   Intake/Output Summary (Last 24 hours) at 12/21/12 0942 Last data filed at 12/21/12 0910  Gross per 24 hour  Intake    780 ml  Output   2850 ml  Net  -2070 ml  Vitals reviewed. General: resting in bed, NAD HEENT: PERRL, EOMI, no scleral icterus Cardiac: RRR, no rubs, 2/6 crescendo murmur, no gallops Pulm: clear to auscultation bilaterally, no wheezes, rales, or rhonchi Abd: soft, nontender, nondistended, BS present Ext: warm and well perfused, no pedal edema Neuro: alert and oriented X3, cranial nerves II-XII grossly intact, strength and sensation to light touch equal in bilateral upper and lower extremities  Lab Results: Basic Metabolic Panel:  Recent Labs Lab 12/19/12 1540 12/20/12 2022 12/21/12 0610  NA 137 137 140  K 4.9 4.1 4.6  CL 104 105 108  CO2 22 19 22   GLUCOSE 79 92 93  BUN 30* 23 21  CREATININE 2.44* 2.34* 2.32*  CALCIUM 9.3 9.8 9.7  MG  --  2.2  --   PHOS  --  3.1  --    Liver Function Tests:  Recent Labs Lab 12/18/12 1552 12/20/12 2022  AST 23 26  ALT 19 20  ALKPHOS 109 129*  BILITOT 0.3 0.2*  PROT 10.8* 12.7*  ALBUMIN 3.2* 2.8*   CBC:  Recent Labs Lab 12/19/12 1540 12/20/12 2022 12/21/12 0610  WBC 7.4 9.1 6.1  NEUTROABS 4.2 5.4  --   HGB 8.1* 8.2* 7.6*  HCT 23.8* 23.3* 22.3*  MCV 97.9 98.7 97.8  PLT 256 257 227   Anemia Panel:  Recent Labs Lab 12/19/12 1541 12/20/12 2022  TIBC 226*  --   IRON 90  --   RETICCTPCT  --  1.8   Urinalysis:  Recent Labs Lab  12/19/12 1642 12/20/12 1844  COLORURINE YELLOW YELLOW  LABSPEC 1.015 1.015  PHURINE 7.0 7.5  GLUCOSEU NEG NEGATIVE  HGBUR LARGE* LARGE*  BILIRUBINUR NEG NEGATIVE  KETONESUR NEG NEGATIVE  PROTEINUR 100* 100*  UROBILINOGEN 0.2 0.2  NITRITE NEG NEGATIVE  LEUKOCYTESUR TRACE* TRACE*   Studies/Results: US Renal  12/20/2012  *RADIOLOGY REPORT*  Clinical Data: Acute renal insufficiency  RENAL/URINARY TRACT ULTRASOUND COMPLETE  Comparison:  CT 09/15/2003  Findings:  Right Kidney:  11.1 cm.  Echogenic cortex.  Normal cortical thickness.  No obstruction.  Left Kidney:  11.3 cm.  Negative for obstruction.  Echogenic cortex with normal thickness.  Bladder:  Small urinary bladder.  IMPRESSION: Echogenic kidneys with normal cortical thickness.  No hydronephrosis.   Original Report Authenticated By: Janeece Riggers, M.D.    Medications: I have reviewed the patient's current medications. Scheduled Meds: . heparin  5,000 Units Subcutaneous Q8H  . sodium chloride  3 mL Intravenous Q12H   Continuous Infusions:  PRN Meds:.sodium chloride, sodium chloride Assessment/Plan:  Acute renal failure: Baseline creatinine at ~0.77. Creatinine on presentation at 2.44 with BUN of 30.  trending down to 2.32 today.  BUN only mildly increased to 30. She denies N/V, or decrease intake of fluids. Her urinalysis shows protein >100, with large Hgb and numerous RBC. Repeat FeNa of 0.7% (from 1.2%), but prerenal azotemia unlikely given no decreased intake per mouth, N/V, or diarrhea, and no decreased UOP. This is likely secondary to intrinsic renal injury, ATN from medications (perhaps tenofivir) or HIV glomerulonephritis. Renal ultrasound with normal appearing kidneys and no hydronephrosis. UOP of 2,100 overnight. Creatinine trended down to 2.32 today with BUN of 21. Phosphorus normal at 3.1. Mg normal at 2.2. Microalbumin/Creatinie elevated at 464. Serum protein gap of increased to 9.9, which could be explained by HIV  hypogammaglobinemia but multiple myeloma also possible.  -ID on board, will follow on Dr. Zenaida Niece Dam's recommendations  -Nephrology consulted, to see patient today -f/u urine eosinophils  -Strict I &O  -Daily Weights  -Continues pulse ox  -Renal diet -SPEP and UPEP with reflex IFE   Normocytic anemia. Baseline Hgb of ~10 but dropped to 8.1 on 3/12. She denies overt bleeding. Hematuria per UA. Etiology unclear. Hgb dropped to 7.6 overnight, she denies bleeding. LDH mildly elevated to 287.   -f/u-Anemia panel  - CBC daily  HIV. Well controlled with viral load on 12/18/12 undetectable. She is followed by Dr. Daiva Eves at the ID clinic. HLA pending. Pt on truvada every other day. Will hold Dolutegravir, and emtriva (Truvada not on formulary) and Tenofivir, due to concerns of possible kidney injury with this medication.  -f/u on ID recommendations.  -Holding ARV for now   HTN. BP of 170/99 on presentation, likely secondary to anxiety. BP normal this morning at 128/75.   DVT prophylaxis: heparin TID  FEN  NSL  Replete as needed  Renal diet .  Marland KitchenServices Needed at time of discharge: Y = Yes, Blank = No PT:   OT:   RN:   Equipment:   Other:     LOS: 1 day   Sara Chu D 12/21/2012, 9:42 AM

## 2012-12-21 NOTE — Progress Notes (Signed)
Pt had small amount of bleeding from nares when blowing her nose. She said she was recently in [place], [place] and had problems with dry nares and slight nosebleeds while there.

## 2012-12-22 LAB — C4 COMPLEMENT: Complement C4, Body Fluid: 8 mg/dL — ABNORMAL LOW (ref 10–40)

## 2012-12-22 LAB — OCCULT BLOOD X 1 CARD TO LAB, STOOL: Fecal Occult Bld: NEGATIVE

## 2012-12-22 LAB — C3 COMPLEMENT: C3 Complement: 120 mg/dL (ref 90–180)

## 2012-12-22 NOTE — Progress Notes (Signed)
Patient ID: Jennifer Boyle, female   DOB: 1966/09/25, 47 y.o.   MRN: [id number]   [facility] Progress Note    Subjective:   Reports to be feeling well-denies any chest pain or shortness of breath. Appetite fair.    Objective:   BP 139/81  Pulse 69  Temp(Src) 98.4 F (36.9 C) (Oral)  Resp 18  Ht 5\' 3"  (1.6 m)  Wt 75.4 kg (166 lb 3.6 oz)  BMI 29.45 kg/m2  SpO2 100%  Intake/Output Summary (Last 24 hours) at 12/22/12 1032 Last data filed at 12/22/12 0932  Gross per 24 hour  Intake 2488.33 ml  Output   3650 ml  Net -1161.67 ml   Weight change: 0.5 kg (1 lb 1.6 oz)  Physical Exam: Gen: Comfortably resting in bed CVS: Pulse regular in rate and rhythm, heart sounds S1 and S2 normal Resp: Clear to auscultation bilaterally, no rales/rhonchi Abd: Soft, flat, nontender and bowel sounds are normal Ext: No lower extremity edema  Imaging: US Renal  12/20/2012  *RADIOLOGY REPORT*  Clinical Data: Acute renal insufficiency  RENAL/URINARY TRACT ULTRASOUND COMPLETE  Comparison:  CT 09/15/2003  Findings:  Right Kidney:  11.1 cm.  Echogenic cortex.  Normal cortical thickness.  No obstruction.  Left Kidney:  11.3 cm.  Negative for obstruction.  Echogenic cortex with normal thickness.  Bladder:  Small urinary bladder.  IMPRESSION: Echogenic kidneys with normal cortical thickness.  No hydronephrosis.   Original Report Authenticated By: Janeece Riggers, M.D.     Labs: BMET  Recent Labs Lab 12/18/12 1552 12/19/12 1540 12/20/12 2022 12/21/12 0610  NA 138 137 137 140  K 4.5 4.9 4.1 4.6  CL 108 104 105 108  CO2 21 22 19 22   GLUCOSE 78 79 92 93  BUN 22 30* 23 21  CREATININE 2.18* 2.44* 2.34* 2.32*  CALCIUM 9.3 9.3 9.8 9.7  PHOS  --   --  3.1  --    CBC  Recent Labs Lab 12/18/12 1552 12/19/12 1540 12/20/12 2022 12/21/12 0610  WBC 6.7 7.4 9.1 6.1  NEUTROABS 3.6 4.2 5.4  --   HGB 8.4* 8.1* 8.2* 7.6*  HCT 25.1* 23.8* 23.3* 22.3*  MCV 99.6 97.9 98.7 97.8  PLT 289 256  257 227    Medications:    . heparin  5,000 Units Subcutaneous Q8H  . sodium chloride  3 mL Intravenous Q12H     Assessment/ Plan:   1. 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 Await labs from today in order to determine renal function trend. Urine output is excellent. Clinically without any features of uremia. If renal function does not improve, renal biopsy may be warranted. 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 Await antinuclear antibody for possible lupus. 2. HIV infection: Anti-retroviral therapy currently on hold with suspicion of drug induced toxicity. 3. Anemia: Iron stores replete. Will consider ESA therapy.  Zetta Bills, MD 12/22/2012, 10:32 AM

## 2012-12-22 NOTE — Progress Notes (Signed)
Subjective:  -She has no complaints today, she denies gross hematuria. No fever or chills. -Urine output 3750 ml   Objective: Vital signs in last 24 hours: Filed Vitals:   12/21/12 0606 12/21/12 1504 12/21/12 2100 12/22/12 0422  BP: 128/75 119/73 107/72 139/81  Pulse: 84 75 85 69  Temp: 98.1 F (36.7 C) 97.8 F (36.6 C) 98.2 F (36.8 C) 98.4 F (36.9 C)  TempSrc: Oral Oral Oral Oral  Resp: 18 18 16 18   Height:      Weight: 167 lb 1.7 oz (75.8 kg)   166 lb 3.6 oz (75.4 kg)  SpO2: 100% 100% 95% 100%   Weight change: 1 lb 1.6 oz (0.5 kg)  Intake/Output Summary (Last 24 hours) at 12/22/12 1032 Last data filed at 12/22/12 0932  Gross per 24 hour  Intake 2488.33 ml  Output   3650 ml  Net -1161.67 ml  Vitals reviewed. General: resting in bed, NAD HEENT: PERRL, EOMI, no scleral icterus Cardiac: RRR, no rubs, 2/6 crescendo murmur, no gallops Pulm: clear to auscultation bilaterally, no wheezes, rales, or rhonchi Abd: soft, nontender, nondistended, BS present Ext: warm and well perfused, no pedal edema Neuro: alert and oriented X3, cranial nerves II-XII grossly intact, strength and sensation to light touch equal in bilateral upper and lower extremities  Lab Results: Basic Metabolic Panel:  Recent Labs Lab 12/19/12 1540 12/20/12 2022 12/21/12 0610  NA 137 137 140  K 4.9 4.1 4.6  CL 104 105 108  CO2 22 19 22   GLUCOSE 79 92 93  BUN 30* 23 21  CREATININE 2.44* 2.34* 2.32*  CALCIUM 9.3 9.8 9.7  MG  --  2.2  --   PHOS  --  3.1  --    Liver Function Tests:  Recent Labs Lab 12/18/12 1552 12/20/12 2022  AST 23 26  ALT 19 20  ALKPHOS 109 129*  BILITOT 0.3 0.2*  PROT 10.8* 12.7*  ALBUMIN 3.2* 2.8*   CBC:  Recent Labs Lab 12/19/12 1540 12/20/12 2022 12/21/12 0610  WBC 7.4 9.1 6.1  NEUTROABS 4.2 5.4  --   HGB 8.1* 8.2* 7.6*  HCT 23.8* 23.3* 22.3*  MCV 97.9 98.7 97.8  PLT 256 257 227   Anemia Panel:  Recent Labs Lab 12/20/12 2022 12/21/12 0610   VITAMINB12  --  595  FOLATE  --  16.9  FERRITIN  --  397*  TIBC  --  226*  IRON  --  85  RETICCTPCT 1.8  --    Urinalysis:  Recent Labs Lab 12/19/12 1642 12/20/12 1844  COLORURINE YELLOW YELLOW  LABSPEC 1.015 1.015  PHURINE 7.0 7.5  GLUCOSEU NEG NEGATIVE  HGBUR LARGE* LARGE*  BILIRUBINUR NEG NEGATIVE  KETONESUR NEG NEGATIVE  PROTEINUR 100* 100*  UROBILINOGEN 0.2 0.2  NITRITE NEG NEGATIVE  LEUKOCYTESUR TRACE* TRACE*   Studies/Results: US Renal  12/20/2012  *RADIOLOGY REPORT*  Clinical Data: Acute renal insufficiency  RENAL/URINARY TRACT ULTRASOUND COMPLETE  Comparison:  CT 09/15/2003  Findings:  Right Kidney:  11.1 cm.  Echogenic cortex.  Normal cortical thickness.  No obstruction.  Left Kidney:  11.3 cm.  Negative for obstruction.  Echogenic cortex with normal thickness.  Bladder:  Small urinary bladder.  IMPRESSION: Echogenic kidneys with normal cortical thickness.  No hydronephrosis.   Original Report Authenticated By: Janeece Riggers, M.D.    Medications: I have reviewed the patient's current medications. Scheduled Meds: . heparin  5,000 Units Subcutaneous Q8H  . sodium chloride  3 mL Intravenous Q12H  Continuous Infusions: . sodium chloride 100 mL/hr at 12/22/12 0752   PRN Meds:.sodium chloride Assessment/Plan:  Acute renal failure: Baseline creatinine at ~0.77. Creatinine on presentation at 2.44 with BUN of 30.  trending down to 2.32 on 12/21/12.   She denies N/V, or decrease intake of fluids. Her urinalysis shows protein >100, with large Hgb and numerous RBC. Repeat FeNa of 0.7% (from 1.2%), but prerenal azotemia unlikely given no decreased intake per mouth, N/V, or diarrhea, and no decreased UOP. This is likely secondary to intrinsic renal injury, ATN from medications (perhaps tenofivir) or HIV glomerulonephritis. Renal ultrasound with normal appearing kidneys and no hydronephrosis. UOP of 3750 ml overnight. Creatinine trended down to 2.32 on 3/14//15.   Microalbumin/Creatinie elevated at 464. 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 Urine eosinophilia was found. Nephrology consulted, suggesting to hold HIV meds. C4=8 which is low and C3 is 120 which is normal.    -ID on board, will follow on Dr. Zenaida Niece Dam's recommendations  -Continues to hold HIV meds per renal.  -Renal diet -pending SPEP and UPEP with reflex IFE  -pending other GN work up tests.  Normocytic anemia. Baseline Hgb of ~10 but dropped to 8.1 on 3/12. She denies overt bleeding. Hematuria per UA. Etiology unclear. Hgb dropped to 7.6 overnight, she denies bleeding. LDH mildly elevated to 287. Anemia panel no iron deficiency.   - will get haptoglobin - CBC daily  HIV. Well controlled with viral load on 12/18/12 undetectable. She is followed by Dr. Daiva Eves at the ID clinic. HLA pending. Pt on truvada every other day. Will hold Dolutegravir, and emtriva (Truvada not on formulary) and Tenofivir, due to concerns of possible kidney injury with this medication.  -f/u on ID recommendations.  -Holding ARV for now per renal  HTN. BP of 170/99 on presentation, likely secondary to anxiety. BP normal this morning at 139/81.   DVT prophylaxis: heparin TID  FEN  NSL  Replete as needed  Renal diet .  Marland KitchenServices Needed at time of discharge: Y = Yes, Blank = No PT:   OT:   RN:   Equipment:   Other:     LOS: 2 days   Lorretta Harp 12/22/2012, 10:32 AM

## 2012-12-23 LAB — BASIC METABOLIC PANEL
BUN: 19 mg/dL (ref 6–23)
Chloride: 110 mEq/L (ref 96–112)
GFR calc Af Amer: 38 mL/min — ABNORMAL LOW (ref >90)
Potassium: 4.3 mEq/L (ref 3.5–5.1)
Sodium: 139 mEq/L (ref 135–145)

## 2012-12-23 LAB — CBC
HCT: 22.2 % — ABNORMAL LOW (ref 36.0–46.0)
RDW: 13.9 % (ref 11.5–15.5)
WBC: 5.8 10*3/uL (ref 4.0–10.5)

## 2012-12-23 NOTE — Progress Notes (Signed)
Subjective:  -She has no complaints today, she denies gross hematuria. No fever or chills. -Urine output 4300 ml - FOBT negative -Cre improving 2.36-->1.77  Objective: Vital signs in last 24 hours: Filed Vitals:   12/22/12 1541 12/22/12 2057 12/23/12 0532 12/23/12 0608  BP: 137/80 152/83 164/94 150/88  Pulse: 91 87 85   Temp: 98.3 F (36.8 C) 98.5 F (36.9 C) 99.1 F (37.3 C)   TempSrc: Oral Oral Oral   Resp: 18 20 18    Height:      Weight:   163 lb 5.8 oz (74.1 kg)   SpO2: 100% 98% 100%    Weight change: -2 lb 13.9 oz (-1.3 kg)  Intake/Output Summary (Last 24 hours) at 12/23/12 1205 Last data filed at 12/23/12 1100  Gross per 24 hour  Intake    480 ml  Output   4400 ml  Net  -3920 ml  Vitals reviewed. General: resting in bed, NAD HEENT: PERRL, EOMI, no scleral icterus Cardiac: RRR, no rubs, 2/6 crescendo murmur, no gallops Pulm: clear to auscultation bilaterally, no wheezes, rales, or rhonchi Abd: soft, nontender, nondistended, BS present Ext: warm and well perfused, no pedal edema Neuro: alert and oriented X3, cranial nerves II-XII grossly intact, strength and sensation to light touch equal in bilateral upper and lower extremities  Lab Results: Basic Metabolic Panel:  Recent Labs Lab 12/19/12 1540  12/20/12 2022 12/21/12 0610 12/23/12 0540  NA 137  --  137 140 139  K 4.9  --  4.1 4.6 4.3  CL 104  --  105 108 110  CO2 22  --  19 22 21   GLUCOSE 79  --  92 93 93  BUN 30*  --  23 21 19   CREATININE 2.44*  < > 2.34* 2.32* 1.77*  CALCIUM 9.3  --  9.8 9.7 9.2  MG  --   --  2.2  --   --   PHOS  --   --  3.1  --   --   < > = values in this interval not displayed. Liver Function Tests:  Recent Labs Lab 12/18/12 1552 12/20/12 2022  AST 23 26  ALT 19 20  ALKPHOS 109 129*  BILITOT 0.3 0.2*  PROT 10.8* 12.7*  ALBUMIN 3.2* 2.8*   CBC:  Recent Labs Lab 12/19/12 1540 12/20/12 2022 12/21/12 0610 12/23/12 0540  WBC 7.4 9.1 6.1 5.8  NEUTROABS 4.2 5.4  --    --   HGB 8.1* 8.2* 7.6* 7.4*  HCT 23.8* 23.3* 22.3* 22.2*  MCV 97.9 98.7 97.8 99.1  PLT 256 257 227 211   Anemia Panel:  Recent Labs Lab 12/20/12 2022 12/21/12 0610  VITAMINB12  --  595  FOLATE  --  16.9  FERRITIN  --  397*  TIBC  --  226*  IRON  --  85  RETICCTPCT 1.8  --    Urinalysis:  Recent Labs Lab 12/19/12 1642 12/20/12 1844  COLORURINE YELLOW YELLOW  LABSPEC 1.015 1.015  PHURINE 7.0 7.5  GLUCOSEU NEG NEGATIVE  HGBUR LARGE* LARGE*  BILIRUBINUR NEG NEGATIVE  KETONESUR NEG NEGATIVE  PROTEINUR 100* 100*  UROBILINOGEN 0.2 0.2  NITRITE NEG NEGATIVE  LEUKOCYTESUR TRACE* TRACE*   Studies/Results: No results found. Medications: I have reviewed the patient's current medications. Scheduled Meds: . heparin  5,000 Units Subcutaneous Q8H  . sodium chloride  3 mL Intravenous Q12H   Continuous Infusions:   PRN Meds:.sodium chloride Assessment/Plan:  Acute renal failure: improving.  Baseline creatinine  at ~0.77. Creatinine on presentation at 2.44 with BUN of 30.  trending down to 1.77 today. Her urinalysis shows protein >100, with large Hgb and numerous RBC. Repeat FeNa of 0.7% (from 1.2%). Renal ultrasound with normal appearing kidneys and no hydronephrosis. Microalbumin/Creatinie elevated at 464. Urine eosinophilia was found. Her Cre is trending down after discontinuation of HIV medications. It appears that the etiology is  tubular toxicity of anti-retroviral therapy per Dr. Allena Katz. C4=8 which is low and C3 is 120 which is normal.   -ID on board, will follow on Dr. Zenaida Niece Dam's recommendations  -Appreciate renal consult in managing our patient. Continue to hold HIV meds per renal. Dr. Allena Katz would like to followup as an outpatient in the renal clinic in the next 2-3 weeks. Per Dr. Allena Katz, she will need at least weekly metabolic panels (hopefully set up at [facility] or PCPs office). -Renal diet -pending SPEP and UPEP with reflex IFE  -pending other GN work up  tests.  Normocytic anemia. Baseline Hgb of ~10 but dropped to 8.1 on 3/12. She denies overt bleeding. Hematuria per UA. Etiology unclear. Hgb dropped to 7.4 overnight, she denies bleeding. LDH mildly elevated to 287, but haptoglobin is normal, indicating less likely to be hemolytic. Anemia panel no iron deficiency.   - CBC daily  HIV. Well controlled with viral load on 12/18/12 undetectable. She is followed by Dr. Daiva Eves at the ID clinic. HLA pending. Pt on truvada every other day. Will hold Dolutegravir, and emtriva (Truvada not on formulary) and Tenofivir, due to concerns of possible kidney injury with this medication.  -f/u on ID recommendations.  -Holding ARV for now per renal. Will contact Dr. Daiva Eves for HIV medicating adjustment tomorrow.  HTN. BP of 170/99 on presentation, likely secondary to anxiety. BP normal this morning at 150/88.   DVT prophylaxis: heparin SQ TID   FEN  NSL  Replete as needed  Renal diet  Disposition: pending GN work up tests, waiting for renal function improving further. Likely d/c  tomorrow   .Services Needed at time of discharge: Y = Yes, Blank = No PT:   OT:   RN:   Equipment:   Other:     LOS: 3 days   Lorretta Harp 12/23/2012, 12:05 PM

## 2012-12-23 NOTE — Progress Notes (Signed)
Patient ID: Jennifer Boyle, female   DOB: [birth year], 47 y.o.   MRN: [id number]   [facility] Progress Note    Subjective:   Reports to be feeling well, ambulated around the hallways without any problems yesterday. No hematuria noted.    Objective:   BP 150/88  Pulse 85  Temp(Src) 99.1 F (37.3 C) (Oral)  Resp 18  Ht 5\' 3"  (1.6 m)  Wt 74.1 kg (163 lb 5.8 oz)  BMI 28.95 kg/m2  SpO2 100%  Intake/Output Summary (Last 24 hours) at 12/23/12 0932 Last data filed at 12/23/12 4098  Gross per 24 hour  Intake    480 ml  Output   3650 ml  Net  -3170 ml   Weight change: -1.3 kg (-2 lb 13.9 oz)  Physical Exam: Gen: Comfortably resting in bed, just came back from ambulating around room CVS: Pulse regular in rate and rhythm, heart sounds S1 and S2 normal Resp: Clear to auscultation bilaterally, no rales/rhonchi Abd: Soft, obese, nontender and bowel sounds are normal Ext: No lower extremity edema  Imaging: No results found.  Labs: BMET  Recent Labs Lab 12/18/12 1552 12/19/12 1540 12/20/12 2022 12/21/12 0610 12/23/12 0540  NA 138 137 137 140 139  K 4.5 4.9 4.1 4.6 4.3  CL 108 104 105 108 110  CO2 21 22 19 22 21   GLUCOSE 78 79 92 93 93  BUN 22 30* 23 21 19   CREATININE 2.18* 2.44* 2.34* 2.32* 1.77*  CALCIUM 9.3 9.3 9.8 9.7 9.2  PHOS  --   --  3.1  --   --    CBC  Recent Labs Lab 12/18/12 1552 12/19/12 1540 12/20/12 2022 12/21/12 0610 12/23/12 0540  WBC 6.7 7.4 9.1 6.1 5.8  NEUTROABS 3.6 4.2 5.4  --   --   HGB 8.4* 8.1* 8.2* 7.6* 7.4*  HCT 25.1* 23.8* 23.3* 22.3* 22.2*  MCV 99.6 97.9 98.7 97.8 99.1  PLT 289 256 257 227 211    Medications:    . heparin  5,000 Units Subcutaneous Q8H  . sodium chloride  3 mL Intravenous Q12H     Assessment/ Plan:   1. 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 From this morning indicative of renal  improvement/recovery of anti-retroviral therapy. Urine output is excellent. 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 Await antinuclear antibody for possible lupus. With recent trend of the labs-it appears more convincing that this might have been from tubular toxicity of anti-retroviral therapy that is getting better. Would recommend continued followup as an outpatient in the renal clinic-I will set her up for this with me in the next 2-3 weeks. She will need at least weekly metabolic panels (hopefully set up at [facility] or PCPs office) if discharged today. Will consider renal biopsy if renal function does not return back to normal or one of the multiple serologies that have been sent comes back as positive. Unlikely to be ANCA vasculitis given spontaneous improvement of renal function. 2. HIV infection: Anti-retroviral therapy currently on hold with suspicion of drug induced toxicity. Would consult with infectious disease to come up with a regime that would be least renally toxic and continue to offer her control of her underlying HIV infection as she has had so far. 3. Anemia: Iron stores replete. Start on Aranesp.  Zetta Bills, MD 12/23/2012, 9:32 AM

## 2012-12-24 ENCOUNTER — Ambulatory Visit: Payer: Self-pay

## 2012-12-24 DIAGNOSIS — B20 Human immunodeficiency virus [HIV] disease: Secondary | ICD-10-CM

## 2012-12-24 LAB — BASIC METABOLIC PANEL
BUN: 18 mg/dL (ref 6–23)
CO2: 20 mEq/L (ref 19–32)
Chloride: 109 mEq/L (ref 96–112)
Glucose, Bld: 122 mg/dL — ABNORMAL HIGH (ref 70–99)
Potassium: 3.8 mEq/L (ref 3.5–5.1)

## 2012-12-24 LAB — CBC
HCT: 22.1 % — ABNORMAL LOW (ref 36.0–46.0)
Hemoglobin: 7.5 g/dL — ABNORMAL LOW (ref 12.0–15.0)
MCHC: 33.9 g/dL (ref 30.0–36.0)
RBC: 2.27 MIL/uL — ABNORMAL LOW (ref 3.87–5.11)
WBC: 5.6 10*3/uL (ref 4.0–10.5)

## 2012-12-24 LAB — RENAL FUNCTION PANEL
BUN: 18 mg/dL (ref 6–23)
CO2: 21 mEq/L (ref 19–32)
Chloride: 110 mEq/L (ref 96–112)
Creatinine, Ser: 1.7 mg/dL — ABNORMAL HIGH (ref 0.50–1.10)
Glucose, Bld: 123 mg/dL — ABNORMAL HIGH (ref 70–99)
Potassium: 3.8 mEq/L (ref 3.5–5.1)

## 2012-12-24 MED ORDER — DARBEPOETIN ALFA-POLYSORBATE 25 MCG/0.42ML IJ SOLN
25.0000 ug | INTRAMUSCULAR | Status: DC
  Filled 2012-12-24: qty 0.42

## 2012-12-24 NOTE — Progress Notes (Signed)
[facility] ROUNDING NOTE   Subjective:   Interval History: none  Objective:  Vital signs in last 24 hours:  Temp:  [98.2 F (36.8 C)-98.6 F (37 C)] 98.3 F (36.8 C) (03/17 0449) Pulse Rate:  [68-93] 68 (03/17 0449) Resp:  [18-20] 18 (03/17 0449) BP: (121-153)/(68-84) 153/84 mmHg (03/17 0449) SpO2:  [100 %] 100 % (03/17 0449) Weight:  [75.1 kg (165 lb 9.1 oz)] 75.1 kg (165 lb 9.1 oz) (03/17 0449)  Weight change: 1 kg (2 lb 3.3 oz) Filed Weights   12/22/12 0422 12/23/12 0532 12/24/12 0449  Weight: 75.4 kg (166 lb 3.6 oz) 74.1 kg (163 lb 5.8 oz) 75.1 kg (165 lb 9.1 oz)    Intake/Output: I/O last 3 completed shifts: In: 720 [P.O.:720] Out: 5400 [Urine:5400]   Intake/Output this shift:  Total I/O In: 420 [P.O.:420] Out: 500 [Urine:500]  CVS- RRR RS- CTA ABD- BS present soft non-distended EXT- no edema   Basic Metabolic Panel:  Recent Labs Lab 12/19/12 1540 12/20/12 2022 12/21/12 0610 12/23/12 0540 12/24/12 0845  NA 137 137 140 139 139  139  K 4.9 4.1 4.6 4.3 3.8  3.8  CL 104 105 108 110 109  110  CO2 22 19 22 21 20  21   GLUCOSE 79 92 93 93 122*  123*  BUN 30* 23 21 19 18  18   CREATININE 2.44* 2.34* 2.32* 1.77* 1.66*  1.70*  CALCIUM 9.3 9.8 9.7 9.2 9.4  9.6  MG  --  2.2  --   --   --   PHOS  --  3.1  --   --  4.7*    Liver Function Tests:  Recent Labs Lab 12/18/12 1552 12/20/12 2022 12/24/12 0845  AST 23 26  --   ALT 19 20  --   ALKPHOS 109 129*  --   BILITOT 0.3 0.2*  --   PROT 10.8* 12.7*  --   ALBUMIN 3.2* 2.8* 2.4*   No results found for this basename: LIPASE, AMYLASE,  in the last 168 hours No results found for this basename: AMMONIA,  in the last 168 hours  CBC:  Recent Labs Lab 12/18/12 1552 12/19/12 1540 12/20/12 2022 12/21/12 0610 12/23/12 0540 12/24/12 0845  WBC 6.7 7.4 9.1 6.1 5.8 5.6  NEUTROABS 3.6 4.2 5.4  --   --   --   HGB 8.4* 8.1* 8.2* 7.6* 7.4* 7.5*  HCT 25.1* 23.8* 23.3* 22.3* 22.2* 22.1*   MCV 99.6 97.9 98.7 97.8 99.1 97.4  PLT 289 256 257 227 211 201    Cardiac Enzymes:  Recent Labs Lab 12/21/12 1055  CKTOTAL 228*  CKMB 1.2    BNP: No components found with this basename: POCBNP,   CBG: No results found for this basename: GLUCAP,  in the last 168 hours  Microbiology: No results found for this or any previous visit.  Coagulation Studies: No results found for this basename: LABPROT, INR,  in the last 72 hours  Urinalysis: No results found for this basename: COLORURINE, APPERANCEUR, LABSPEC, PHURINE, GLUCOSEU, HGBUR, BILIRUBINUR, KETONESUR, PROTEINUR, UROBILINOGEN, NITRITE, LEUKOCYTESUR,  in the last 72 hours    Imaging: No results found.   Medications:     . heparin  5,000 Units Subcutaneous Q8H  . sodium chloride  3 mL Intravenous Q12H   sodium chloride  Assessment/ Plan:  1. Acute renal failure: Suspected antiretroviral toxicity. 2. HIV infection: Anti-retroviral therapy currently on hold with suspicion of drug induced toxicity. Would consult with infectious  disease to come up with a regime that would be least renally toxic and continue to offer her control of her underlying HIV infection as she has had so far.  3. Anemia: Iron stores replete. Start on Aranesp.  Renal function improving will sign off and schedule outpatient follow up Dr Allena Katz  LOS: 4 Metta Koranda W @TODAY @10 :20 AM

## 2012-12-24 NOTE — Care Management Note (Signed)
    Page 1 of 1   12/24/2012     2:19:04 PM   CARE MANAGEMENT NOTE 12/24/2012  Patient:  Jennifer Boyle, Jennifer Boyle   Account Number:  [id number]  Date Initiated:  12/21/2012  Documentation initiated by:  Letha Cape  Subjective/Objective Assessment:   dx acute renal failure  admit- lives alone. pta indep.     Action/Plan:   Anticipated DC Date:  12/24/2012   Anticipated DC Plan:  HOME/SELF CARE      DC Planning Services  CM consult      Choice offered to / List presented to:             Status of service:  Completed, signed off Medicare Important Message given?   (If response is "NO", the following Medicare IM given date fields will be blank) Date Medicare IM given:   Date Additional Medicare IM given:    Discharge Disposition:  HOME/SELF CARE  Per UR Regulation:  Reviewed for med. necessity/level of care/duration of stay  If discussed at Long Length of Stay Meetings, dates discussed:    Comments:  12/24/12 12:57 Letha Cape RN, BSN [phone] patient lives alone, pta indep.  Patient states she receives her HARRT meds from ID clinic.  The renal MD will be starting patient on a new med, Aranesp for anemia.  will check the price for this med at [facility].  This medication is $700 per injectable, I checked with our pharmacy and it is $600 / injectable.  I called Dr. Allena Katz and informed him of this information he stated he will switch her to procritl 10,000 weekly, I checked the price on this and it will cost patient $225 per vial.  Patient states she can not afford this either, called Dr. Allena Katz to give him this information he states to inform patient that her iron stores are adequate so no need to put her on iron pills, so they will just monitor her and if she does not take the aranesp or the procrit it will not cause any problems.   I informed patient of this information.  Patient states she wants to go home today.

## 2012-12-24 NOTE — Progress Notes (Signed)
Internal Medicine Attending  Date: 12/24/2012  Patient name: Jennifer Boyle Medical record number: [id number] Date of birth: 1966/04/06 Age: 47 y.o. Gender: female  I saw and evaluated the patient, and discussed her care on AM rounds with house staff. I reviewed the resident's note by Dr. Garald Braver and I agree with the resident's findings and plans as documented in her note.

## 2012-12-24 NOTE — Progress Notes (Signed)
MEDICATION RELATED CONSULT NOTE - INITIAL   Pharmacy Consult:  Aranesp Indication:  Anemia of CKD  Allergies  Allergen Reactions  . Aspirin     Upset stomach   . Efavirenz     hives    Patient Measurements: Height: 5\' 3"  (160 cm) Weight: 165 lb 9.1 oz (75.1 kg) IBW/kg (Calculated) : 52.4  Vital Signs: Temp: 98.3 F (36.8 C) (03/17 0449) Temp src: Oral (03/17 0449) BP: 153/84 mmHg (03/17 0449) Pulse Rate: 68 (03/17 0449) Intake/Output from previous day: 03/16 0701 - 03/17 0700 In: 240 [P.O.:240] Out: 2850 [Urine:2850] Intake/Output from this shift: Total I/O In: 420 [P.O.:420] Out: 500 [Urine:500]  Labs:  Recent Labs  12/23/12 0540 12/24/12 0845  WBC 5.8 5.6  HGB 7.4* 7.5*  HCT 22.2* 22.1*  PLT 211 201  CREATININE 1.77* 1.66*  1.70*  PHOS  --  4.7*  ALBUMIN  --  2.4*   Estimated Creatinine Clearance: 40.2 ml/min (by C-G formula based on Cr of 1.66).   Microbiology: No results found for this or any previous visit (from the past 720 hour(s)).  Medical History: Past Medical History  Diagnosis Date  . Heart murmur   . Pneumonia 10/2002; 2010    Required INTUBATION for streptococcal pneumonia/septicemia (10/2002)  . HIV disease DX: 01/2002  . Fibroid uterus   . 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 Peripheral neuropathy         Assessment: 46 YOF admitted with abnormal creatinine while on HARRT therapy.  Patient's renal function is improving.  She has anemia of CKD and Pharmacy consulted to manage Aranesp.  Her iron level is WNL.   Goal of Therapy:  Hgb > 11 (g/dL)   Plan:  - Aranesp 25 mcg SQ q-Mon - Monitor hgb and d/c Aranesp if hgb is sustainable (hgb  >=/ 11 g/dL) - F/U SCr and resumption of HAART     Olyvia Gopal D. Laney Potash, PharmD, BCPS Pager:  [phone] 12/24/2012, 10:24 AM

## 2012-12-24 NOTE — Progress Notes (Signed)
Discharge instructions reviewed with patient verbalize and understands. Skin WNL Left floor ambulatory accompanied by staff.

## 2012-12-24 NOTE — Consult Note (Signed)
    [facility]     Reason for Consult: 042    Referring Physician: Dr. Meredith Pel  Principal Problem:   Acute renal insufficiency Active Problems:   HIV DISEASE   Microcytic anemia   . darbepoetin (ARANESP) injection - NON-DIALYSIS  25 mcg Subcutaneous Q Mon-1800  . heparin  5,000 Units Subcutaneous Q8H  . sodium chloride  3 mL Intravenous Q12H    Recommendations: Hold ARVs pending HLA testing and improvement of renal function, ok to discharge off of ARVs, we will follow up ARVs in clinic and we will arrange appointment  We will arrange follow up with [facility] for about 1 week with Dr. Daiva Eves and do weekly BMP starting that day (3/24) with results to Dr. Zetta Bills of nephrology. Will consider abacavir with tivicay and lamivudine once she is in clinic depending on HLA testing, possibly Epzicom if renal function continues to recover  Assessment: She has been well controled on ARV therapy previously with Kaletra and Truvada which was changed to Stribild then to Tivicay and Truvada after she did not tolerate Stribild.  Her last CD 4 was 590 and viral load undetectable in October 2013.  She has unfortunately though developed a tubular toxicity that seems likely to be from tenofovir.    Antibiotics: none  HPI: Jennifer Boyle is a 46 y.o. female with well controlled HIV on Tivicay and Truvada who had presented to [facility] for routine follow up and noted to have an elevated creatinine of 2.18 where it had previously been WNL.  She had no specific complaints. She also was noted to have a drop in her hemoglobin.  No recent illnesses.  She was admitted for further work up and felt to be consistent with tenofovir toxicity.  She is currently not on ARVs.     Review of Systems: Pertinent items are noted in HPI.  Past Medical History  Diagnosis Date  . Heart murmur   . Pneumonia 10/2002; 2010    Required INTUBATION for streptococcal pneumonia/septicemia (10/2002)  . HIV  disease DX: 01/2002  . Fibroid uterus   . 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 Peripheral neuropathy     History  Substance Use Topics  . Smoking status: Never Smoker   . Smokeless tobacco: Never Used  . Alcohol Use: No    History reviewed. No pertinent family history. Allergies  Allergen Reactions  . Aspirin     Upset stomach   . Efavirenz     hives    OBJECTIVE: Blood pressure 152/93, pulse 80, temperature 98.1 F (36.7 C), temperature source Oral, resp. rate 20, height 5\' 3"  (1.6 m), weight 165 lb 9.1 oz (75.1 kg), SpO2 100.00%. General: AAO x 3, nad Skin: no rashes Lungs: CTA B Cor: RRR without m/r/g Abdomen: soft, ntnd, +bs Ext: no edema  Microbiology: No results found for this or any previous visit (from the past 240 hour(s)).  Jennifer Righter, MD [facility] [phone] pager  [phone] cell 12/24/2012, 12:57 PM

## 2012-12-24 NOTE — Progress Notes (Signed)
Subjective: She states that she feels fine. She denies overt bleeding, shortness of breath, heart palpitations or dizziness.   Objective: Vital signs in last 24 hours: Filed Vitals:   12/23/12 0608 12/23/12 1453 12/23/12 2110 12/24/12 0449  BP: 150/88 121/68 152/77 153/84  Pulse:  93 92 68  Temp:  98.2 F (36.8 C) 98.6 F (37 C) 98.3 F (36.8 C)  TempSrc:  Oral Oral Oral  Resp:  18 20 18   Height:      Weight:    165 lb 9.1 oz (75.1 kg)  SpO2:  100% 100% 100%   Weight change: 2 lb 3.3 oz (1 kg)  Intake/Output Summary (Last 24 hours) at 12/24/12 1001 Last data filed at 12/24/12 0900  Gross per 24 hour  Intake    660 ml  Output   3350 ml  Net  -2690 ml   Vitals reviewed.  General: resting in bed, NAD  HEENT: PERRL, EOMI, no scleral icterus  Cardiac: RRR, no rubs, 2/6 crescendo murmur, no gallops  Pulm: clear to auscultation bilaterally, no wheezes, rales, or rhonchi  Abd: soft, nontender, nondistended, BS present  Ext: warm and well perfused, no pedal edema  Neuro: alert and oriented X3, cranial nerves II-XII grossly intact, strength and sensation to light touch equal in bilateral upper and lower extremities   Lab Results: Basic Metabolic Panel:  Recent Labs Lab 12/19/12 1540  12/20/12 2022 12/21/12 0610 12/23/12 0540  NA 137  --  137 140 139  K 4.9  --  4.1 4.6 4.3  CL 104  --  105 108 110  CO2 22  --  19 22 21   GLUCOSE 79  --  92 93 93  BUN 30*  --  23 21 19   CREATININE 2.44*  < > 2.34* 2.32* 1.77*  CALCIUM 9.3  --  9.8 9.7 9.2  MG  --   --  2.2  --   --   PHOS  --   --  3.1  --   --   < > = values in this interval not displayed. Liver Function Tests:  Recent Labs Lab 12/18/12 1552 12/20/12 2022  AST 23 26  ALT 19 20  ALKPHOS 109 129*  BILITOT 0.3 0.2*  PROT 10.8* 12.7*  ALBUMIN 3.2* 2.8*   CBC:  Recent Labs Lab 12/19/12 1540 12/20/12 2022  12/23/12 0540 12/24/12 0845  WBC 7.4 9.1  < > 5.8 5.6  NEUTROABS 4.2 5.4  --   --   --   HGB 8.1*  8.2*  < > 7.4* 7.5*  HCT 23.8* 23.3*  < > 22.2* 22.1*  MCV 97.9 98.7  < > 99.1 97.4  PLT 256 257  < > 211 201  < > = values in this interval not displayed. Cardiac Enzymes:  Recent Labs Lab 12/21/12 1055  CKTOTAL 228*  CKMB 1.2   Anemia Panel:  Recent Labs Lab 12/20/12 2022 12/21/12 0610  VITAMINB12  --  595  FOLATE  --  16.9  FERRITIN  --  397*  TIBC  --  226*  IRON  --  85  RETICCTPCT 1.8  --    Urinalysis:  Recent Labs Lab 12/19/12 1642 12/20/12 1844  COLORURINE YELLOW YELLOW  LABSPEC 1.015 1.015  PHURINE 7.0 7.5  GLUCOSEU NEG NEGATIVE  HGBUR LARGE* LARGE*  BILIRUBINUR NEG NEGATIVE  KETONESUR NEG NEGATIVE  PROTEINUR 100* 100*  UROBILINOGEN 0.2 0.2  NITRITE NEG NEGATIVE  LEUKOCYTESUR TRACE* TRACE*   Medications: I  have reviewed the patient's current medications. Scheduled Meds: . heparin  5,000 Units Subcutaneous Q8H  . sodium chloride  3 mL Intravenous Q12H   Continuous Infusions:  PRN Meds:.sodium chloride Assessment/Plan: Acute renal failure: improving. Baseline creatinine at ~0.77. Creatinine on presentation at 2.44 with BUN of 30. trending down to 1.77-->1.66 today. Her urinalysis shows protein >100, with large Hgb and numerous RBC. Repeat FeNa of 0.7% (from 1.2%). Renal ultrasound with normal appearing kidneys and no hydronephrosis. Microalbumin/Creatinie elevated at 464. Urine eosinophilia was found. Her Creatinine is trending down after discontinuation of HIV medications. It appears that the etiology is tubular toxicity of anti-retroviral therapy per Dr. Allena Katz. C4=8 which is low and C3 is 120 which is normal.  -ID on board, will follow on Dr. Ephriam Knuckles recommendation for least nephrotoxic anti-retrovirals -Appreciate renal consult in managing our patient. Continue to hold HIV meds per renal. Dr. Allena Katz will see her in the Nephrology clinic in the next 2-3 weeks. Per Dr. Allena Katz, she will need at least weekly metabolic panels (hopefully set up at [facility] or PCPs  office).  -Renal diet  -pending SPEP and UPEP with reflex IFE  -pending other GN work up tests. (ANA, ANCA)  Normocytic anemia. Baseline Hgb of ~10 but dropped to 8.1 on 3/12. She denies overt bleeding. Hematuria per UA. Etiology unclear. Hgb dropped to 7.4 but stable at 7.5 today, she denies bleeding. LDH mildly elevated to 287, but haptoglobin is normal, indicating less likely to be hemolytic. Anemia panel with no iron deficiency.  - CBC daily  - Started Aranesp per Nephrology recommendations  HIV. Well controlled with viral load on 12/18/12 undetectable. She is followed by Dr. Daiva Eves at the ID clinic. HLA pending. Pt on truvada every other day. Will hold Dolutegravir, and emtriva (Truvada not on formulary) and Tenofivir, due to concerns of possible kidney injury with this medication.  -f/u on ID recommendations for antiretroviral with less potential for nephrotoxicity  HTN. BP of 170/99 on presentation, likely secondary to anxiety. BP still elevated today at 153/84 this morning, this may be secondary to recent IVF but she will need reassessment in the outpatient setting for this problem.    DVT prophylaxis: heparin SQ TID   FEN  NSL  Replete as needed  Renal diet   Disposition: Likely discharge today pending recommendation from ID for her antiretroviral therapy.   .Services Needed at time of discharge: Y = Yes, Blank = No  PT:    OT:    RN:    Equipment:    Other:        LOS: 4 days   Sara Chu D 12/24/2012, 10:01 AM

## 2012-12-24 NOTE — Discharge Summary (Signed)
[facility] Discharge Note  Name: Jennifer Boyle MRN: [id number] DOB: 08-10-1966 47 y.o.  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  At the hospital follow up visit please address: -Please repeat CBC and BMET for Hbg and Cr trending -Please refer her to Hematology/Oncology for evaluation and treatment of suspected multiple myeloma as her UPEP and IFE remarkable for Bence Jones protein -She may be ready for restart of ART therapy   Follow-up Appointments:     Follow-up Information   Follow up with [name], MD On 12/31/2012. (11:15AM)    Contact information:   [address] [place] [address] [phone]       Follow up with Dagoberto Ligas., MD On 01/14/2013. (at 2:30PM )    Contact information:   [address] [facility] [place] [address] [phone]      Discharge Orders   Future Appointments Provider Department Dept Phone   12/31/2012 11:15 AM Randall Hiss, MD [facility] [phone]   12/31/2012 11:30 AM [facility] [phone]   Future Orders Complete By Expires     Diet - low sodium heart healthy  As directed     Increase activity slowly  As directed         Consultations: Treatment Team:  Hartley Barefoot. Allena Katz, MD  Procedures Performed:  US Renal  12/20/2012  *RADIOLOGY REPORT*  Clinical Data: Acute renal insufficiency  RENAL/URINARY TRACT ULTRASOUND COMPLETE  Comparison:  CT 09/15/2003  Findings:  Right Kidney:  11.1 cm.  Echogenic cortex.  Normal cortical thickness.  No obstruction.  Left Kidney:  11.3 cm.  Negative for obstruction.  Echogenic cortex with normal thickness.  Bladder:  Small urinary bladder.  IMPRESSION: Echogenic kidneys with normal cortical thickness.  No hydronephrosis.   Original Report Authenticated By: Janeece Riggers, M.D.     Admission HPI:  Jennifer Boyle is a very pleasant 47 year old woman, immigrant from [place], with PMH of HIV (undetectable viral load on 10/13) who presented to the ID clinic today following routine laboratory values from 3/12 revealing acute kidney failure and normocytic anemia. She was evaluated today by Dr. Daiva Eves and instructed to come to the [facility] for direct admission. She states that she has been feeling tired lately with decreased appetite but denies recent N/V, or diarrhea. She had two episodes of vomiting one month ago that resolved on its own. 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 She denies shortness of breath, chest pain, heart palpitations, abdominal pain, dysuria, hematuria, hematochezia, or melena.   Hospital Course by problem list: Acute renal failure: Improving. Etiology initially thought to be due to tenofivir toxicity but multiple myeloma now high in the  differential. Her baseline creatinine is at ~0.77. Creatinine on presentation at 2.44 with BUN of 30. Trended down to 1.77-->1.66 prior to her discharge. Her urinalysis showed protein >100, with large Hgb and numerous RBC.Repeat FeNa of 0.7% (from 1.2% initially). Renal ultrasound with normal appearing kidneys and no hydronephrosis. Microalbumin/Creatinie elevated at 464. Urine eosinophilia was found. She had an elevated protein gap  at 9.9 and SPEP and UPEP with IFE were ordered. The results of these studies returned after her discharge and are very suspicious for multiple myeloma with SPEP remarkable for monoclonal protein and UPEP with IFE with Bence Jones proteins. She will follow up with Dr. Daiva Eves who has been notified of this finding and will likely refer her to Hematology/Oncology for possible bone marrow biopsy.  Per Dr. Luciana Axe, her antiretroviral therapy will be held until she is seen by Dr. Daiva Eves on 3/24. She was closely monitored by Nephrology during this hospitalization and will follow up with Dr. Allena Katz in three weeks.    Normocytic anemia. Her baseline hemoglobin is approximately 10 but dropped to 8.1 on 3/12. She denies overt bleeding. She has microscopic hematuria per UA. Etiology unclear but this could be explained by multiple myeloma, she did have tear drop RBCs on peripheral smear. Her Hgb dropped to 7.4 but stable at 7.5 prior to her discharge, she denied bleeding during this hospitalizaiton. LDH mildly elevated to 287, but haptoglobin is normal, indicating less likely to be hemolytic. Anemia panel with no iron deficiency. Aranesp initiation was considered per Nephrology recommendations but the patient stated that she was unable to afford this medication as an ongoing treatment. Given the high suspicion for multiple myeloma, she will be referred to Hematology/Oncology for evaluation and treatment of suspected multiple myeloma.   Possible Multiple Myeloma. Results of SPEP and UPEP returned after her discharge and, therefore, were not discussed with the patient during her hospitalization. She has follow up appointment with Dr. Daiva Eves on 12/31/12. She presented with acute normocytic anemia with drop in Hgb from ~10 to 8.1. Her LDL was mildly elevated at 287 but her haptoglobin was normal, making hemolytic anemia less likely. Her FOBT was negative and aside from very mild nose bleed she denied overt bleeding. Her peripheral  blood smear was remarkable for Teardrop cells with Rouleuax . She had a protein gap of 9.9 on presentation with proteinuria >100, warranting further work up with SPEP and UPEP with IFE which results returned after her discharge. Her SPEP is remarkable for monoclonal protein, gamma globulin. Her UPEP is remarkable for elevated Kappa chain with IFE remarkable for Bence Jones protein. Based on these findings, in addition to her acute renal failure, multiple myeloma is highly likely. She will likely need a referral to Hematology/Oncology for further evaluation with possible bone marrow biopsy. She has a follow up appointment with Dr. Daiva Eves on 3/24. We discussed these findings with Dr. Daiva Eves who will inform the patient and arrange appropriate referral for further evaluation and treatment of possible multiple myeloma.    HIV. Well controlled with viral load on 12/18/12 undetectable. She is followed by Dr. Daiva Eves at the ID clinic. HLA pending. She was on truvada every other day prior to presentation. We held this medication due to concerns of Tenofovir renal injury which can be seen in context of multiple myeloma. She was discharged with no antiretroviral therapy per Dr. Ephriam Knuckles recommendation. She will follow up with Dr. Daiva Eves for possible restart of her antiretroviral therapy.  HTN. Her BP was elevated to 170/99 on presentation, likely secondary to anxiety. Her BP was still elevated prior to her discharge at 153/84. Etiology unclear, could be a component of recent IVF hydration. She will need reassessment of her BP in outpatient setting. She will follow up with ID and Nephrology after her discharge.    Discharge Vitals:  BP 152/93  Pulse 80  Temp(Src) 98.1 F (36.7 C) (Oral)  Resp 20  Ht 5\' 3"  (1.6 m)  Wt 165 lb 9.1 oz (75.1 kg)  BMI 29.34 kg/m2  SpO2 100%  Discharge Labs:  Results for orders placed during the hospital encounter of 12/20/12 (from the past 24 hour(s))  CBC     Status: Abnormal    Collection Time    12/24/12  8:45 AM      Result Value Range   WBC 5.6  4.0 - 10.5 K/uL   RBC 2.27 (*) 3.87 - 5.11 MIL/uL   Hemoglobin 7.5 (*) 12.0 - 15.0 g/dL   HCT 21.3 (*) 08.6 - 57.8 %   MCV 97.4  78.0 - 100.0 fL   MCH 33.0  26.0 - 34.0 pg   MCHC 33.9  30.0 - 36.0 g/dL   RDW 46.9  62.9 - 52.8 %   Platelets 201  150 - 400 K/uL  BASIC METABOLIC PANEL     Status: Abnormal   Collection Time    12/24/12  8:45 AM      Result Value Range   Sodium 139  135 - 145 mEq/L   Potassium 3.8  3.5 - 5.1 mEq/L   Chloride 109  96 - 112 mEq/L   CO2 20  19 - 32 mEq/L   Glucose, Bld 122 (*) 70 - 99 mg/dL   BUN 18  6 - 23 mg/dL   Creatinine, Ser 4.13 (*) 0.50 - 1.10 mg/dL   Calcium 9.4  8.4 - 24.4 mg/dL   GFR calc non Af Amer 36 (*) >90 mL/min   GFR calc Af Amer 41 (*) >90 mL/min  RENAL FUNCTION PANEL     Status: Abnormal   Collection Time    12/24/12  8:45 AM      Result Value Range   Sodium 139  135 - 145 mEq/L   Potassium 3.8  3.5 - 5.1 mEq/L   Chloride 110  96 - 112 mEq/L   CO2 21  19 - 32 mEq/L   Glucose, Bld 123 (*) 70 - 99 mg/dL   BUN 18  6 - 23 mg/dL   Creatinine, Ser 0.10 (*) 0.50 - 1.10 mg/dL   Calcium 9.6  8.4 - 27.2 mg/dL   Phosphorus 4.7 (*) 2.3 - 4.6 mg/dL   Albumin 2.4 (*) 3.5 - 5.2 g/dL   GFR calc non Af Amer 35 (*) >90 mL/min   GFR calc Af Amer 40 (*) >90 mL/min    Signed: Sara Chu D 12/24/2012, 2:26 PM   Time Spent on Discharge: 40 minutes Services Ordered on Discharge: None Equipment Ordered on Discharge: None

## 2012-12-25 LAB — PROTEIN ELECTROPHORESIS, SERUM
Albumin ELP: 33 % — ABNORMAL LOW (ref 55.8–66.1)
Alpha-1-Globulin: 3.2 % (ref 2.9–4.9)
Beta 2: 3.4 % (ref 3.2–6.5)
Beta Globulin: 4 % — ABNORMAL LOW (ref 4.7–7.2)

## 2012-12-25 LAB — UIFE/LIGHT CHAINS/TP QN, 24-HR UR
Alpha 1, Urine: DETECTED — AB
Alpha 2, Urine: DETECTED — AB
Free Kappa/Lambda Ratio: 5257.58 ratio — ABNORMAL HIGH (ref 2.04–10.37)
Total Protein, Urine: 6967.9 mg/dL

## 2012-12-25 LAB — PROTEIN ELECTROPH W RFLX QUANT IMMUNOGLOBULINS
Albumin ELP: 31.4 % — ABNORMAL LOW (ref 55.8–66.1)
Alpha-1-Globulin: 3.2 % (ref 2.9–4.9)

## 2012-12-25 LAB — ANCA SCREEN W REFLEX TITER: Atypical p-ANCA Screen: NEGATIVE

## 2012-12-25 LAB — IMMUNOFIXATION ADD-ON

## 2012-12-25 LAB — IGG, IGA, IGM
IgA: 20 mg/dL — ABNORMAL LOW (ref 69–380)
IgG (Immunoglobin G), Serum: 7040 mg/dL — ABNORMAL HIGH (ref 690–1700)

## 2012-12-25 NOTE — Progress Notes (Signed)
Thanks Richard, lets get her set up with bone marrow biopsy quickly. 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 Can you forward to Starleen Arms my RN as well.  I believe I will be seeing her next week

## 2012-12-26 DIAGNOSIS — R809 Proteinuria, unspecified: Secondary | ICD-10-CM | POA: Diagnosis present

## 2012-12-26 LAB — HLA B*5701: HLA-B*5701: NEGATIVE

## 2012-12-27 NOTE — Progress Notes (Addendum)
Per Dr. Daiva Eves, results of UPEP/SPEP to be discussed with patient during her upcoming appointment on 3/24 at which time he will also discuss possibility of multiple myeloma and referral to Hematology/Oncology for possible bone marrow biopsy. I discussed this plan with Dr. Meredith Pel who agrees with the above plan. Of note, serum IFE already added on with following report:   [facility] Elect[facility] Int (NOTE)    Comments: Monoclonal IgG kappa protein is present. Reviewed by Dallas Breeding, MD, PhD, FCAP (Electronic Signature on File)  Resulting Agency [facility]   Specimen Collected: 12/21/12 10:55 AM Last Resulted: 12/25/12 1:59 PM

## 2012-12-31 ENCOUNTER — Telehealth: Payer: Self-pay | Admitting: Infectious Disease

## 2012-12-31 ENCOUNTER — Ambulatory Visit (INDEPENDENT_AMBULATORY_CARE_PROVIDER_SITE_OTHER): Payer: No Typology Code available for payment source | Admitting: Infectious Disease

## 2012-12-31 ENCOUNTER — Other Ambulatory Visit: Payer: Self-pay | Admitting: Licensed Clinical Social Worker

## 2012-12-31 ENCOUNTER — Encounter: Payer: Self-pay | Admitting: Infectious Disease

## 2012-12-31 ENCOUNTER — Ambulatory Visit (INDEPENDENT_AMBULATORY_CARE_PROVIDER_SITE_OTHER): Payer: No Typology Code available for payment source | Admitting: *Deleted

## 2012-12-31 VITALS — BP 147/89 | HR 98 | Temp 97.6°F | Wt 166.0 lb

## 2012-12-31 DIAGNOSIS — Z0000 Encounter for general adult medical examination without abnormal findings: Secondary | ICD-10-CM

## 2012-12-31 DIAGNOSIS — D62 Acute posthemorrhagic anemia: Secondary | ICD-10-CM

## 2012-12-31 DIAGNOSIS — D649 Anemia, unspecified: Secondary | ICD-10-CM

## 2012-12-31 DIAGNOSIS — R799 Abnormal finding of blood chemistry, unspecified: Secondary | ICD-10-CM

## 2012-12-31 DIAGNOSIS — B20 Human immunodeficiency virus [HIV] disease: Secondary | ICD-10-CM

## 2012-12-31 DIAGNOSIS — N289 Disorder of kidney and ureter, unspecified: Secondary | ICD-10-CM

## 2012-12-31 DIAGNOSIS — R778 Other specified abnormalities of plasma proteins: Secondary | ICD-10-CM | POA: Insufficient documentation

## 2012-12-31 DIAGNOSIS — Z124 Encounter for screening for malignant neoplasm of cervix: Secondary | ICD-10-CM

## 2012-12-31 LAB — CBC WITH DIFFERENTIAL/PLATELET
Basophils Absolute: 0 10*3/uL (ref 0.0–0.1)
Basophils Relative: 0 % (ref 0–1)
Eosinophils Absolute: 0 10*3/uL (ref 0.0–0.7)
Eosinophils Relative: 1 % (ref 0–5)
HCT: 20.8 % — ABNORMAL LOW (ref 36.0–46.0)
Hemoglobin: 7 g/dL — ABNORMAL LOW (ref 12.0–15.0)
Lymphocytes Relative: 21 % (ref 12–46)
Lymphs Abs: 1 10*3/uL (ref 0.7–4.0)
MCH: 33 pg (ref 26.0–34.0)
MCHC: 33.7 g/dL (ref 30.0–36.0)
MCV: 98.1 fL (ref 78.0–100.0)
Monocytes Absolute: 0.3 10*3/uL (ref 0.1–1.0)
Monocytes Relative: 7 % (ref 3–12)
Neutro Abs: 3.3 10*3/uL (ref 1.7–7.7)
Neutrophils Relative %: 71 % (ref 43–77)
Platelets: 212 10*3/uL (ref 150–400)
RBC: 2.12 MIL/uL — ABNORMAL LOW (ref 3.87–5.11)
RDW: 13.9 % (ref 11.5–15.5)
WBC: 4.7 10*3/uL (ref 4.0–10.5)

## 2012-12-31 MED ORDER — ABACAVIR SULFATE 300 MG PO TABS: 600.0000 mg | ORAL_TABLET | Freq: Every day | ORAL | Status: DC

## 2012-12-31 MED ORDER — LAMIVUDINE 150 MG PO TABS: 150.0000 mg | ORAL_TABLET | Freq: Every day | ORAL | Status: DC

## 2012-12-31 MED ORDER — DOLUTEGRAVIR SODIUM 50 MG PO TABS: 50.0000 mg | ORAL_TABLET | Freq: Every day | ORAL | Status: DC

## 2012-12-31 NOTE — Telephone Encounter (Signed)
Stat cbc and bmp with gfr

## 2012-12-31 NOTE — Telephone Encounter (Signed)
, °

## 2012-12-31 NOTE — Progress Notes (Signed)
Subjective:    Patient ID: Jennifer Boyle, female    DOB: 1965-10-31, 47 y.o.   MRN: [id number]  HPI   Jennifer Boyle is a 47 y.o. female who had been  doing superbly well on her  antiviral regimen, with [place] and truvada with undetectable viral load and health cd4 count. We had simplified her STRIBILD, but she did not tolerate this and we then changed to [place] and truvada.   She had done very well on this without any complaints whatsoever. Then on routine labs  we noted that her serum creatinine had gone from a normal value to above 2. The BUN was still normal. In addition to this acute laboratory abnormality we noticed that her hemoglobin had dropped to 8 and hematocrit down to 25 ,   We dose adjusted  Her truvada and then stopped it and the tivicay while working up her renal function and anemia ultimately admitting her to the hospital due to accelerating worsened renal fxn. She was seen by Nephrology while on the B service. Her cr improved during admission but she was found to have Bence jones protein on Urine and SPEP abnormal as well raising high suspicion for Multiple Myeloma.  I am arranging Bone marrow biopsy for path today which she would like PRIOR to being seen by Oncology.  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  I spent greater than 45 minutes with the patient including greater than 50% of time in face to face counsel of the patient and in coordination of their care.      Review of Systems  Constitutional: Positive for fatigue. Negative for fever, chills, diaphoresis, activity change, appetite change and unexpected weight change.  HENT: Negative for congestion, sore throat, rhinorrhea, sneezing, trouble swallowing and sinus pressure.   Eyes: Negative for photophobia and visual disturbance.  Respiratory: Negative for cough, chest tightness, shortness of breath, wheezing and stridor.    Cardiovascular: Negative for chest pain, palpitations and leg swelling.  Gastrointestinal: Negative for nausea, vomiting, abdominal pain, diarrhea, constipation, blood in stool, abdominal distention and anal bleeding.  Genitourinary: Negative for dysuria, hematuria, flank pain and difficulty urinating.  Musculoskeletal: Negative for myalgias, back pain, joint swelling, arthralgias and gait problem.  Skin: Negative for color change, pallor, rash and wound.  Neurological: Negative for dizziness, tremors, weakness and light-headedness.  Hematological: Negative for adenopathy. Does not bruise/bleed easily.  Psychiatric/Behavioral: Negative for behavioral problems, confusion, sleep disturbance, dysphoric mood, decreased concentration and agitation.       Objective:   Physical Exam  Constitutional: She is oriented to person, place, and time. She appears well-developed and well-nourished. No distress.  HENT:  Head: Normocephalic and atraumatic.  Mouth/Throat: Oropharynx is clear and moist. No oropharyngeal exudate.  Eyes: Conjunctivae and EOM are normal. Pupils are equal, round, and reactive to light. No scleral icterus.  Neck: Normal range of motion. Neck supple. No JVD present.  Cardiovascular: Normal rate, regular rhythm and normal heart sounds.  Exam reveals no gallop and no friction rub.   No murmur heard. Pulmonary/Chest: Effort normal and breath sounds normal. No respiratory distress. She has no wheezes. She has no rales. She exhibits no tenderness.  Abdominal: She exhibits no distension and no mass. There is no tenderness. There is no rebound and no guarding.  Musculoskeletal: She exhibits no edema and no tenderness.  Lymphadenopathy:    She has no cervical adenopathy.  Neurological: She is  alert and oriented to person, place, and time. She exhibits normal muscle tone. Coordination normal.  Skin: Skin is warm and dry. She is not diaphoretic. No erythema. No pallor.  Psychiatric: She  has a normal mood and affect. Her behavior is normal. Judgment and thought content normal.          Assessment & Plan:  HIV: change Truvada to QOD until we have repeat BMP showing improved GFR to normal  Acute renal insufficiency: Recheck metabolic panel today recheck urinalysis with urine sodium and creatinine and urine eosinophils. His serum creatinine remains elevated we'll also check an ultrasound of the kidneys and referred to nephrology.renally dose adjust her Truvada  Microcytic anemia with drop in her hemoglobin and hematocrit: Recheck labs today and recheck iron panel. LDH She may need endoscopic evaluation although she does not have any overt evidence of gastrointestinal bleed.  I will see her back in next two weeks at the latest   Review of Systems  Constitutional: Positive for fatigue. Negative for fever, chills, diaphoresis, activity change, appetite change and unexpected weight change.  HENT: Negative for congestion, sore throat, rhinorrhea, sneezing, trouble swallowing and sinus pressure.   Eyes: Negative for photophobia and visual disturbance.  Respiratory: Negative for cough, chest tightness, shortness of breath, wheezing and stridor.   Cardiovascular: Negative for chest pain, palpitations and leg swelling.  Gastrointestinal: Negative for nausea, vomiting, abdominal pain, diarrhea, constipation, blood in stool, abdominal distention and anal bleeding.  Genitourinary: Negative for dysuria, hematuria, flank pain and difficulty urinating.  Musculoskeletal: Negative for myalgias, back pain, joint swelling, arthralgias and gait problem.  Skin: Negative for color change, pallor, rash and wound.  Neurological: Negative for dizziness, tremors, weakness and light-headedness.  Hematological: Negative for adenopathy. Does not bruise/bleed easily.  Psychiatric/Behavioral: Negative for behavioral problems, confusion, sleep disturbance, dysphoric mood, decreased concentration and  agitation. The patient is nervous/anxious.        Objective:   Physical Exam  Constitutional: She is oriented to person, place, and time. She appears well-developed and well-nourished. No distress.  HENT:  Head: Normocephalic and atraumatic.  Eyes: Conjunctivae and EOM are normal.  Neck: Normal range of motion. Neck supple.  Cardiovascular: Normal rate, regular rhythm and normal heart sounds.  Exam reveals no friction rub.   Pulmonary/Chest: Effort normal and breath sounds normal. She has no wheezes.  Abdominal: She exhibits no distension and no mass. There is no tenderness. There is no rebound and no guarding.  Neurological: She is alert and oriented to person, place, and time. Coordination normal.  Skin: Skin is warm and dry. She is not diaphoretic.  Psychiatric: Her mood appears anxious.          Assessment & Plan:   Abnormal SPEP: with Bence jones protein, worsening renal fxn, anemia high concern for MM.   --will see if IFE can be added --will arrange for BM biopsy with path, FISH etc --refer to Oncology   Renal insufficiency: likely due to MM +/- TNF toxicity --recheck her bmp today  HIV: recheck bmp c gfr today --if GFR >50 change to epzicom and tivicay --if <50 Abacavir 600mg , Lamivudine 150mg  and TIvicay 50mg  daily   HTN: partly anxiety driven will be more aggressive about this once acute issues have resolved

## 2012-12-31 NOTE — Patient Instructions (Signed)
  Your results will be ready in about a week.  You may look them up on MyChart.  Thank you for coming to the Center for your care.  Denise, RN 

## 2012-12-31 NOTE — Progress Notes (Signed)
  Subjective:     Jennifer Boyle is a 47 y.o. woman who comes in today for a  pap smear only. Previous abnormal Pap smears: no:. Contraception: condoms Uncomfortable for pat when introducing speculum.  Vaginal irritation noted.   Pelvic Exam:  Pap smear obtained.   Assessment:    Screening pap smear.   Plan:    Follow up in one year, or as indicated by Pap results.  Pt given condoms. Pt given educational materials re: HIV, heart disease, nutrition, diet, and PAP smears. Pt completed Mammogram Scholarship application.

## 2012-12-31 NOTE — Telephone Encounter (Signed)
Patient's hemoglobin is 7 nearly at threshold for asymptomatic transfusion  CR is slightly worse at 1.77  I will place her on Abacavir, epivir and Tivicay   For renal dose adjustment.  Can we have her come back for lab check  Later this week or early next week.

## 2012-12-31 NOTE — Telephone Encounter (Signed)
Spoke with patient she is coming in 01/10/2013 next Thursday for labs. What orders does she need put in?

## 2013-01-01 ENCOUNTER — Other Ambulatory Visit: Payer: Self-pay | Admitting: Licensed Clinical Social Worker

## 2013-01-01 ENCOUNTER — Telehealth: Payer: Self-pay | Admitting: *Deleted

## 2013-01-01 DIAGNOSIS — R110 Nausea: Secondary | ICD-10-CM

## 2013-01-01 DIAGNOSIS — N289 Disorder of kidney and ureter, unspecified: Secondary | ICD-10-CM

## 2013-01-01 LAB — BASIC METABOLIC PANEL WITH GFR
CO2: 22 mEq/L (ref 19–32)
Calcium: 9.3 mg/dL (ref 8.4–10.5)
Creat: 2.2 mg/dL — ABNORMAL HIGH (ref 0.50–1.10)
GFR, Est Non African American: 26 mL/min — ABNORMAL LOW
Sodium: 138 mEq/L (ref 135–145)

## 2013-01-01 MED ORDER — PROMETHAZINE HCL 25 MG PO TABS: 25.0000 mg | ORAL_TABLET | Freq: Four times a day (QID) | ORAL | Status: DC | PRN

## 2013-01-01 NOTE — Telephone Encounter (Signed)
Should not be TRUVADA at all. May even have to adjust her lamivudine dose.She is coming for labs next week

## 2013-01-01 NOTE — Telephone Encounter (Signed)
Call from [facility] pharmacist regarding whether patient should still be on Truvada. Note is confusing first says it was stopped due to creatinine then says she should take it every other day, please advise. Also, Truvada is no longer on med list. Wendall Mola

## 2013-01-01 NOTE — Telephone Encounter (Signed)
Will notify pharmacy. Patient called back stating she is having nausea and would like an Rx such as promethazine sent to [facility]. Wendall Mola

## 2013-01-02 LAB — IMMUNOFIXATION ELECTROPHORESIS
IgG (Immunoglobin G), Serum: 6840 mg/dL — ABNORMAL HIGH (ref 690–1700)
Total Protein, Serum Electrophoresis: 10.5 g/dL — ABNORMAL HIGH (ref 6.0–8.3)

## 2013-01-02 NOTE — Telephone Encounter (Signed)
Phenergan 25mg  qid prn is fine

## 2013-01-04 ENCOUNTER — Encounter (HOSPITAL_COMMUNITY): Payer: Self-pay | Admitting: Pharmacy Technician

## 2013-01-07 ENCOUNTER — Other Ambulatory Visit (INDEPENDENT_AMBULATORY_CARE_PROVIDER_SITE_OTHER): Payer: No Typology Code available for payment source

## 2013-01-07 ENCOUNTER — Telehealth: Payer: Self-pay | Admitting: Infectious Disease

## 2013-01-07 DIAGNOSIS — N289 Disorder of kidney and ureter, unspecified: Secondary | ICD-10-CM

## 2013-01-07 DIAGNOSIS — B20 Human immunodeficiency virus [HIV] disease: Secondary | ICD-10-CM

## 2013-01-07 LAB — CBC WITH DIFFERENTIAL/PLATELET
Basophils Absolute: 0.1 10*3/uL (ref 0.0–0.1)
Basophils Relative: 1 % (ref 0–1)
Eosinophils Absolute: 0 10*3/uL (ref 0.0–0.7)
Eosinophils Relative: 0 % (ref 0–5)
HCT: 21.8 % — ABNORMAL LOW (ref 36.0–46.0)
Lymphocytes Relative: 51 % — ABNORMAL HIGH (ref 12–46)
MCH: 32.7 pg (ref 26.0–34.0)
MCHC: 33.5 g/dL (ref 30.0–36.0)
MCV: 97.8 fL (ref 78.0–100.0)
Monocytes Absolute: 0.6 10*3/uL (ref 0.1–1.0)
Platelets: 232 10*3/uL (ref 150–400)
RDW: 14.5 % (ref 11.5–15.5)
WBC: 4.8 10*3/uL (ref 4.0–10.5)

## 2013-01-07 LAB — RENAL FUNCTION PANEL
Albumin: 2.5 g/dL — ABNORMAL LOW (ref 3.5–5.2)
BUN: 27 mg/dL — ABNORMAL HIGH (ref 6–23)
CO2: 14 mEq/L — ABNORMAL LOW (ref 19–32)
Chloride: 110 mEq/L (ref 96–112)
Creat: 3.06 mg/dL — ABNORMAL HIGH (ref 0.50–1.10)
Glucose, Bld: 100 mg/dL — ABNORMAL HIGH (ref 70–99)

## 2013-01-07 MED ORDER — LAMIVUDINE 100 MG PO TABS: 100.0000 mg | ORAL_TABLET | Freq: Every day | ORAL | Status: DC

## 2013-01-07 MED ORDER — DOLUTEGRAVIR SODIUM 50 MG PO TABS: 50.0000 mg | ORAL_TABLET | Freq: Every day | ORAL | Status: DC

## 2013-01-07 MED ORDER — SODIUM BICARBONATE 650 MG PO TABS: 650.0000 mg | ORAL_TABLET | Freq: Three times a day (TID) | ORAL | Status: DC

## 2013-01-07 NOTE — Telephone Encounter (Signed)
I called Zetta Bills and he recommended starting the pt on sodium bicarbonate 650mg  tid and recheck of labs later in the week.  If this becomes TOO difficult to manage as an outpatient (ie she cant afford drugs etc) we may simply have to admit her to the hospital  I will call in rx for the bicarbonate and call Ms Karn  She also would like an xray done to look at her ribs which are hurting

## 2013-01-07 NOTE — Telephone Encounter (Signed)
Patsy's labs are deteriorating   I have changed her ARV regimen based on her worsening renal fxn  She now needs TWICE DAILY TIVICAY  She also needs to be on 100 mg of EPIVIR rather than 150mg   The Ziagen stays at 600mg   Her blood is becoming more acidotic as well. Her hemoglobin fortunately is NOT yet at threshold for transfusion  I WOULD LIKE HER SEEN BY A KIDNEY DOCTOR AS WELL AS HER HAVING THE BONE MARROW BIOPSY

## 2013-01-07 NOTE — Telephone Encounter (Signed)
Patient will be seeing Dr. Allena Katz for her kidneys, and she has some questions for Dr. Daiva Eves. I will forward this to him.

## 2013-01-08 ENCOUNTER — Ambulatory Visit (HOSPITAL_COMMUNITY)
Admission: RE | Admit: 2013-01-08 | Discharge: 2013-01-08 | Disposition: A | Discharge status: Home / Self Care | Payer: Self-pay | Source: Ambulatory Visit | Attending: Infectious Disease | Admitting: Infectious Disease

## 2013-01-08 DIAGNOSIS — R079 Chest pain, unspecified: Secondary | ICD-10-CM | POA: Insufficient documentation

## 2013-01-08 DIAGNOSIS — N289 Disorder of kidney and ureter, unspecified: Secondary | ICD-10-CM

## 2013-01-08 DIAGNOSIS — C9000 Multiple myeloma not having achieved remission: Secondary | ICD-10-CM

## 2013-01-08 HISTORY — DX: Multiple myeloma not having achieved remission: C90.00

## 2013-01-08 LAB — URINALYSIS, ROUTINE W REFLEX MICROSCOPIC
Bilirubin Urine: NEGATIVE
Ketones, ur: NEGATIVE mg/dL
Nitrite: NEGATIVE
Specific Gravity, Urine: 1.015 (ref 1.005–1.030)
Urobilinogen, UA: 0.2 mg/dL (ref 0.0–1.0)

## 2013-01-08 LAB — URINALYSIS, MICROSCOPIC ONLY: Casts: NONE SEEN

## 2013-01-09 ENCOUNTER — Other Ambulatory Visit: Payer: Self-pay | Admitting: Radiology

## 2013-01-09 ENCOUNTER — Telehealth: Payer: Self-pay | Admitting: *Deleted

## 2013-01-09 NOTE — Telephone Encounter (Signed)
Pt given xray results. Verbalized understanding 

## 2013-01-10 ENCOUNTER — Other Ambulatory Visit: Payer: Self-pay

## 2013-01-10 ENCOUNTER — Other Ambulatory Visit: Payer: Self-pay | Admitting: Licensed Clinical Social Worker

## 2013-01-10 ENCOUNTER — Other Ambulatory Visit (INDEPENDENT_AMBULATORY_CARE_PROVIDER_SITE_OTHER): Payer: No Typology Code available for payment source

## 2013-01-10 DIAGNOSIS — B20 Human immunodeficiency virus [HIV] disease: Secondary | ICD-10-CM

## 2013-01-10 DIAGNOSIS — N289 Disorder of kidney and ureter, unspecified: Secondary | ICD-10-CM

## 2013-01-10 LAB — BASIC METABOLIC PANEL WITH GFR
CO2: 17 mEq/L — ABNORMAL LOW (ref 19–32)
Calcium: 9.1 mg/dL (ref 8.4–10.5)
GFR, Est African American: 19 mL/min — ABNORMAL LOW
Glucose, Bld: 112 mg/dL — ABNORMAL HIGH (ref 70–99)
Potassium: 4.7 mEq/L (ref 3.5–5.3)
Sodium: 140 mEq/L (ref 135–145)

## 2013-01-10 MED ORDER — DOLUTEGRAVIR SODIUM 50 MG PO TABS: 100.0000 mg | ORAL_TABLET | Freq: Every day | ORAL | Status: DC

## 2013-01-11 ENCOUNTER — Encounter (HOSPITAL_COMMUNITY): Payer: Self-pay

## 2013-01-11 ENCOUNTER — Telehealth: Payer: Self-pay | Admitting: Infectious Disease

## 2013-01-11 ENCOUNTER — Ambulatory Visit (HOSPITAL_COMMUNITY)
Admission: RE | Admit: 2013-01-11 | Discharge: 2013-01-11 | Disposition: A | Discharge status: Home / Self Care | Payer: Self-pay | Source: Ambulatory Visit | Attending: Infectious Disease | Admitting: Infectious Disease

## 2013-01-11 DIAGNOSIS — D472 Monoclonal gammopathy: Secondary | ICD-10-CM | POA: Insufficient documentation

## 2013-01-11 DIAGNOSIS — Z21 Asymptomatic human immunodeficiency virus [HIV] infection status: Secondary | ICD-10-CM | POA: Insufficient documentation

## 2013-01-11 DIAGNOSIS — D649 Anemia, unspecified: Secondary | ICD-10-CM | POA: Insufficient documentation

## 2013-01-11 DIAGNOSIS — R778 Other specified abnormalities of plasma proteins: Secondary | ICD-10-CM

## 2013-01-11 DIAGNOSIS — Z79899 Other long term (current) drug therapy: Secondary | ICD-10-CM | POA: Insufficient documentation

## 2013-01-11 LAB — CBC
HCT: 20.5 % — ABNORMAL LOW (ref 36.0–46.0)
MCH: 32.8 pg (ref 26.0–34.0)
MCV: 100.5 fL — ABNORMAL HIGH (ref 78.0–100.0)
Platelets: 334 10*3/uL (ref 150–400)
RDW: 14.1 % (ref 11.5–15.5)

## 2013-01-11 MED ORDER — MIDAZOLAM HCL 2 MG/2ML IJ SOLN
INTRAMUSCULAR | Status: AC
  Filled 2013-01-11: qty 6

## 2013-01-11 MED ORDER — FENTANYL CITRATE 0.05 MG/ML IJ SOLN
INTRAMUSCULAR | Status: AC | PRN
  Administered 2013-01-11 (×2): 50 ug via INTRAVENOUS
  Administered 2013-01-11: 100 ug via INTRAVENOUS

## 2013-01-11 MED ORDER — SODIUM CHLORIDE 0.9 % IV SOLN
INTRAVENOUS | Status: DC
  Administered 2013-01-11: 08:00:00 via INTRAVENOUS

## 2013-01-11 MED ORDER — FENTANYL CITRATE 0.05 MG/ML IJ SOLN
INTRAMUSCULAR | Status: AC
  Filled 2013-01-11: qty 6

## 2013-01-11 MED ORDER — MIDAZOLAM HCL 2 MG/2ML IJ SOLN
INTRAMUSCULAR | Status: AC | PRN
  Administered 2013-01-11: 1 mg via INTRAVENOUS
  Administered 2013-01-11: 2 mg via INTRAVENOUS
  Administered 2013-01-11: 1 mg via INTRAVENOUS

## 2013-01-11 NOTE — Progress Notes (Addendum)
CRITICAL VALUE ALERT  Critical value received: Hgb 6.7  Date of notification:  01/11/13  Time of notification:  0730  Critical value read back: yes  Nurse who received alert:  Harrietta Guardian RN  MD notified (1st page):  Caryn Bee Burning PA  Time of first page: At Bedside  MD notified (2nd page):  Time of second page:  Responding MD:    Time MD responded:

## 2013-01-11 NOTE — Telephone Encounter (Signed)
Pts hgb below transfusion threshold and WILL NOT get better anytime soon  Exhorted her to come in but she refused  I asked her to have hgb checked on Monday with DR. Patel and she grudgingly agreed to that  Instructed her to come to ED with any ssx of weakness, LH, dizziness, CP, DOE (all of which she denied currently)

## 2013-01-11 NOTE — H&P (Signed)
Chief Complaint: "I'm here for a bone marrow biopsy" Referring Physician:[place] HPI: Jennifer Boyle is an 47 y.o. female with hx of HIV and anemia. 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 She is referred for bone marrow biopsy as part of her workup. PMHx and meds reviewed.  Past Medical History:  Past Medical History  Diagnosis Date  . Heart murmur   . Pneumonia 10/2002; 2010    Required INTUBATION for streptococcal pneumonia/septicemia (10/2002)  . HIV disease DX: 01/2002  . Fibroid uterus   . 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 Peripheral neuropathy     Past Surgical History:  Past Surgical History  Procedure Laterality Date  . No past surgeries      Family History: No family history on file.  Social History:  reports that she has never smoked. She has never used smokeless tobacco. She reports that she does not drink alcohol or use illicit drugs.  Allergies:  Allergies  Allergen Reactions  . Aspirin     Upset stomach   . Efavirenz Hives and Rash    (brand name sustiva)    Medications: abacavir (ZIAGEN) 300 MG tablet (Taking) 60 tablet 11 12/31/2012 Sig - Route: Take 2 tablets (600 mg total) by mouth daily. - Oral Comment: This is patient's new regimen Number of times this order has been changed since signing: 1 Order [address] promethazine (PHENERGAN) 25 MG tablet (Taking) 30 tablet 0 01/01/2013 Sig - Route: Take 1 tablet (25 mg total) by mouth every 6 (six) hours as needed for nausea. - Oral Class: Phone In   Please HPI for pertinent positives, otherwise complete 10 system ROS negative.  Physical Exam:    General Appearance:  Alert, cooperative, no  distress, appears stated age  Head:  Normocephalic, without obvious abnormality, atraumatic  ENT: Unremarkable  Neck: Supple, symmetrical, trachea midline.  Lungs:   Clear to auscultation bilaterally, no w/r/r, respirations unlabored without use of accessory muscles.  Chest Wall:  No tenderness or deformity  Heart:  Regular rate and rhythm, S1, S2 normal, no murmur, rub or gallop.   Neurologic: Normal affect, no gross deficits.   Results for orders placed during the hospital encounter of 01/11/13 (from the past 48 hour(s))  CBC     Status: Abnormal   Collection Time    01/11/13  7:25 AM      Result Value Range   WBC 5.8  4.0 - 10.5 K/uL   RBC 2.04 (*) 3.87 - 5.11 MIL/uL   Hemoglobin 6.7 (*) 12.0 - 15.0 g/dL   Comment: REPEATED TO VERIFY     CRITICAL RESULT CALLED TO, READ BACK BY AND VERIFIED WITH:     J. PIGPEN RN AT 0745 ON 04.04.14 BY SHUEA   HCT 20.5 (*) 36.0 - 46.0 %   MCV 100.5 (*) 78.0 - 100.0 fL   MCH 32.8  26.0 - 34.0 pg   MCHC 32.7  30.0 - 36.0 g/dL   RDW 16.1  09.6 - 04.5 %   Platelets 334  150 - 400 K/uL   No results found.  Assessment/Plan HIV, anemia, monoclonal IgG kappa protein Discussed procedure of CT guided BM biopsy. Discussed  risks, complications, use of sedation Lab reviewed. Consent signed in chart  Brayton El PA-C 01/11/2013, 8:39 AM

## 2013-01-11 NOTE — Procedures (Signed)
SUCCESSFUL RT ILIAC BM ASP AND CORE BXS NO COMP STABLE FULL REPORT IN PACS

## 2013-01-14 ENCOUNTER — Ambulatory Visit: Payer: Self-pay

## 2013-01-15 ENCOUNTER — Telehealth: Payer: Self-pay | Admitting: Infectious Disease

## 2013-01-15 ENCOUNTER — Other Ambulatory Visit: Payer: Self-pay | Admitting: Licensed Clinical Social Worker

## 2013-01-15 DIAGNOSIS — D4989 Neoplasm of unspecified behavior of other specified sites: Secondary | ICD-10-CM | POA: Insufficient documentation

## 2013-01-15 DIAGNOSIS — N179 Acute kidney failure, unspecified: Secondary | ICD-10-CM

## 2013-01-15 NOTE — Telephone Encounter (Signed)
No I will make the referral in the morning

## 2013-01-15 NOTE — Telephone Encounter (Signed)
Jennifer Boyle she needs referral to oncology. 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 Does she have heme/onc appt yet?  (I dont think so)

## 2013-01-15 NOTE — Telephone Encounter (Signed)
Thanks [employer]

## 2013-01-15 NOTE — Progress Notes (Signed)
Patient went to see Dr. Allena Katz her nephrologist and he wanted a magnesium, BMP, and UA

## 2013-01-15 NOTE — Progress Notes (Signed)
He should also get a cbc

## 2013-01-16 ENCOUNTER — Other Ambulatory Visit (INDEPENDENT_AMBULATORY_CARE_PROVIDER_SITE_OTHER): Payer: No Typology Code available for payment source

## 2013-01-16 DIAGNOSIS — N179 Acute kidney failure, unspecified: Secondary | ICD-10-CM

## 2013-01-16 DIAGNOSIS — B20 Human immunodeficiency virus [HIV] disease: Secondary | ICD-10-CM

## 2013-01-16 LAB — URINALYSIS
Bilirubin Urine: NEGATIVE
Glucose, UA: NEGATIVE mg/dL
Ketones, ur: NEGATIVE mg/dL
Specific Gravity, Urine: 1.014 (ref 1.005–1.030)
Urobilinogen, UA: 0.2 mg/dL (ref 0.0–1.0)

## 2013-01-17 ENCOUNTER — Encounter: Payer: Self-pay | Admitting: Infectious Disease

## 2013-01-17 ENCOUNTER — Telehealth: Payer: Self-pay | Admitting: Infectious Disease

## 2013-01-17 LAB — BASIC METABOLIC PANEL WITH GFR
BUN: 19 mg/dL (ref 6–23)
CO2: 23 mEq/L (ref 19–32)
Chloride: 107 mEq/L (ref 96–112)
Glucose, Bld: 70 mg/dL (ref 70–99)
Potassium: 4.9 mEq/L (ref 3.5–5.3)
Sodium: 137 mEq/L (ref 135–145)

## 2013-01-17 LAB — CBC WITH DIFFERENTIAL/PLATELET
Eosinophils Absolute: 0.1 10*3/uL (ref 0.0–0.7)
Eosinophils Relative: 1 % (ref 0–5)
HCT: 19 % — ABNORMAL LOW (ref 36.0–46.0)
Lymphs Abs: 2.8 10*3/uL (ref 0.7–4.0)
MCH: 32.6 pg (ref 26.0–34.0)
MCV: 98.4 fL (ref 78.0–100.0)
Monocytes Absolute: 0.5 10*3/uL (ref 0.1–1.0)
Platelets: 321 10*3/uL (ref 150–400)
RBC: 1.93 MIL/uL — ABNORMAL LOW (ref 3.87–5.11)
RDW: 14.7 % (ref 11.5–15.5)

## 2013-01-17 NOTE — Telephone Encounter (Signed)
I have not called her yet. I can call her more easily when I get back tomorrow

## 2013-01-17 NOTE — Telephone Encounter (Signed)
Jennifer Boyle's Hemoglobin continues to be below 7 now at 6.3. She really, REALLY is going to need a blood transfusion. Tamika can you call her and see what we can do? Is short stay a possibility for transfusion?

## 2013-01-17 NOTE — Telephone Encounter (Signed)
I made the referral her appointment is 01/28/2013 at 10:30 with Dr. Gaylyn Rong. Have you called the patient about the results?

## 2013-01-17 NOTE — Telephone Encounter (Signed)
If it is ok I will give her appointment to the Cancer Center after you speak with her. She has inquired about them already today.

## 2013-01-17 NOTE — Telephone Encounter (Signed)
They do transfusions at [facility] and Jennifer Boyle but the first availability is next Thursday. Patient stated she did not want a blood transfusion yesterday when she was here and that she felt better and was not short of breath anymore.

## 2013-01-18 NOTE — Telephone Encounter (Signed)
Left message for her to call me re biopsy results and need for transfusion

## 2013-01-21 ENCOUNTER — Telehealth: Payer: Self-pay | Admitting: *Deleted

## 2013-01-21 ENCOUNTER — Telehealth: Payer: Self-pay | Admitting: Infectious Disease

## 2013-01-21 NOTE — Telephone Encounter (Signed)
We'll get her in this week ASAP.  Thanks.  Roshaunda Starkey.

## 2013-01-21 NOTE — Telephone Encounter (Signed)
Jennifer Boyle can you get in touch with someone in Dr. Lodema Pilot office  I called to let the pt know about the results of her bone marrow biopsy and to let her know when her appt with Heme/Onc was.  She was VERY frustrated by hearing about this for the first time today.  She again refused blood transfusion. Her specific reason is that it reminds her of the means by which she apparently acquired HIV in [place] (transfusion)  Can we have someone from Cancer center call her and let her know how to arrive there what time etc (looks like 1030 am for financial counselor for starters)  Thanks  [employer]

## 2013-01-21 NOTE — Telephone Encounter (Signed)
Patient called and advised that the doctor called her 01/18/13 and was unable to reach her and she was just returning his call. Advised the her not sure of what he may have called her for and will send him a message to give her a call back.

## 2013-01-22 ENCOUNTER — Telehealth: Payer: Self-pay | Admitting: Oncology

## 2013-01-22 NOTE — Telephone Encounter (Signed)
S/W PT IN RE TO APPT CHANGE TO 4//15 @ 3:45. PT CONFIRMED APPT

## 2013-01-22 NOTE — Patient Instructions (Addendum)
1.  Diagnosis:  IgG kappa, multiple myeloma; presenting with anemia and renal failure.  2.  Causes:  Unknown.  3.  What is it:  Most of the time, a slow growing cancer of the plasma cell in the bone marrow.  Untreated, it can cause:  Hypercalcemia, renal failure, anemia, and bone fracture.  Having any one of these 4 criteria indicates need for treatment.   4.  Stage:  Not classily staged like solid cancer; but rather according to bone marrow cytogenetics and whether there are indication to treat.  5.  Treatment:     *  Oral + SQ chemo:  Revlimid (days 1-21); Velcade SQ once a week (3 weeks on, 1 week off);  Dexamethasone (once a week); every 4 week-cycle.  Given acute kidney problem at this time, I would recommend starting out with just Velcade and Dexamethasone.  In the future, if your kidney function improves, we may add on Revlimid chemotherapy pill.    *  In patients who are reasonably in good health, we may consider autologous bone marrow transplant to improve chance of cure.   6.  Potential side effects of these chemo:  Low blood count, bleeding, infection, neuropathy, skin rash, blood clot.   7.  Chance of response:  Upward to 70-80% depending on risk stratification.    8.  How to follow response:  M-spike (on SPEP) and serum light chain should decrease with chemo.    9.  Anemia:  Due to multiple myeloma. I recommend blood transfusion to relieve symptoms of anemia.  10. Acute kidney failure: Due to multiple myeloma. I will discuss with your kidney doctor to see if plasma pheresis is appropriate to maintain your kidney function while starting chemotherapy.

## 2013-01-22 NOTE — Telephone Encounter (Signed)
Thanks so much. 

## 2013-01-23 ENCOUNTER — Ambulatory Visit (HOSPITAL_BASED_OUTPATIENT_CLINIC_OR_DEPARTMENT_OTHER): Payer: No Typology Code available for payment source | Admitting: Oncology

## 2013-01-23 ENCOUNTER — Ambulatory Visit: Payer: Self-pay

## 2013-01-23 ENCOUNTER — Other Ambulatory Visit (HOSPITAL_BASED_OUTPATIENT_CLINIC_OR_DEPARTMENT_OTHER): Payer: No Typology Code available for payment source | Admitting: Lab

## 2013-01-23 ENCOUNTER — Encounter: Payer: Self-pay | Admitting: Oncology

## 2013-01-23 VITALS — BP 156/91 | HR 87 | Temp 98.5°F | Resp 18 | Ht 63.0 in | Wt 157.9 lb

## 2013-01-23 DIAGNOSIS — D649 Anemia, unspecified: Secondary | ICD-10-CM

## 2013-01-23 DIAGNOSIS — C9000 Multiple myeloma not having achieved remission: Secondary | ICD-10-CM

## 2013-01-23 DIAGNOSIS — D4989 Neoplasm of unspecified behavior of other specified sites: Secondary | ICD-10-CM

## 2013-01-23 DIAGNOSIS — N179 Acute kidney failure, unspecified: Secondary | ICD-10-CM

## 2013-01-23 LAB — CBC WITH DIFFERENTIAL/PLATELET
BASO%: 0.2 % (ref 0.0–2.0)
Basophils Absolute: 0 10*3/uL (ref 0.0–0.1)
EOS%: 1.1 % (ref 0.0–7.0)
Eosinophils Absolute: 0.1 10*3/uL (ref 0.0–0.5)
HCT: 18.4 % — ABNORMAL LOW (ref 34.8–46.6)
HGB: 6.2 g/dL — CL (ref 11.6–15.9)
LYMPH%: 36.2 % (ref 14.0–49.7)
MCH: 33.9 pg (ref 25.1–34.0)
MCHC: 33.5 g/dL (ref 31.5–36.0)
MCV: 101.2 fL — ABNORMAL HIGH (ref 79.5–101.0)
MONO#: 0.6 10*3/uL (ref 0.1–0.9)
MONO%: 7 % (ref 0.0–14.0)
NEUT#: 4.6 10*3/uL (ref 1.5–6.5)
NEUT%: 55.5 % (ref 38.4–76.8)
Platelets: 259 10*3/uL (ref 145–400)
RBC: 1.82 10*6/uL — ABNORMAL LOW (ref 3.70–5.45)
RDW: 14.5 % (ref 11.2–14.5)
WBC: 8.2 10*3/uL (ref 3.9–10.3)
lymph#: 3 10*3/uL (ref 0.9–3.3)

## 2013-01-23 LAB — BASIC METABOLIC PANEL (CC13)
BUN: 19.4 mg/dL (ref 7.0–26.0)
CO2: 18 mEq/L — ABNORMAL LOW (ref 22–29)
Calcium: 9.1 mg/dL (ref 8.4–10.4)
Chloride: 110 mEq/L — ABNORMAL HIGH (ref 98–107)
Creatinine: 4 mg/dL (ref 0.6–1.1)
Glucose: 90 mg/dl (ref 70–99)
Potassium: 4.7 mEq/L (ref 3.5–5.1)
Sodium: 137 mEq/L (ref 136–145)

## 2013-01-23 LAB — CHROMOSOME ANALYSIS, BONE MARROW

## 2013-01-23 MED ORDER — DEXAMETHASONE 4 MG PO TABS: 40.0000 mg | ORAL_TABLET | ORAL | Status: DC

## 2013-01-23 MED ORDER — ACYCLOVIR 400 MG PO TABS: 200.0000 mg | ORAL_TABLET | Freq: Every day | ORAL | Status: DC

## 2013-01-23 MED ORDER — PROCHLORPERAZINE MALEATE 10 MG PO TABS: 10.0000 mg | ORAL_TABLET | Freq: Four times a day (QID) | ORAL | Status: DC | PRN

## 2013-01-23 NOTE — Progress Notes (Signed)
Checked in new patient. She said she has been trying to call a Lynden Ang because she never recd her green card?? I told her I would contact the [facility] I knew to see if the same and have her send. No financial issues. I did advise her must call the billing each time she gets bill for discount to be taken. She has applied for Affortable Care Act so waiting for card. EPP is good thru 06/2013

## 2013-01-23 NOTE — Progress Notes (Signed)
[facility]  [phone] [phone]     INITIAL HEMATOLOGY CONSULTATION    Referral MD:  Dr. [name],  M.D.   Reason for Referral: IgG kappa multiple myeloma; presented with anemia, acute renal failure. Initial M spike was 5.15 g/dL; IgG 7040 mg/dL; positive Bence Jones protein. Bone marrow biopsy on 01/11/2013  showed 40% plasma cell. Cytogenetics and myeloma FISH panel are pending.     HPI: Ms. Jennifer Boyle is a 47 year old African American woman with history of HIV/AIDS. She was in usual sterile health until March 2014 when she developed progressive shortness of breath, dyspnea on exertion. She was found to have severe anemia and acute renal failure. Worked up in the hospital showed she had positive Bence Jones protein. She underwent diagnostic bone marrow biopsy that was arranged by her infectious disease physician on 01/11/2013 with pathology showing multiple myeloma. She was therefore kindly referred to the cancer Center for evaluation.   Jennifer Boyle presented to the clinic today the first time by herself.   she reported that she still has dyspnea exertion. This is stable. She denies chest pain, hemoptysis, gum bleeding, melena, hematochezia, vaginal bleeding, hematuria. She has mild bilateral rib pains depending on her sleeping position. She denies severe low back pain, bowel bladder incontinence, low shotty paresthesia. She reports feeling anxious with a new diagnosis of multiple myeloma. The only family member she has in the [place] is her sister who lives in [place].  The patient used to work as a Tour manager. Since the diagnosis of multiple myeloma, she has not been working.  Patient denies fever, anorexia, weight loss, fatigue, headache, visual changes, confusion, drenching night sweats, palpable lymph node swelling, mucositis, odynophagia, dysphagia, nausea vomiting, jaundice, chest pain, palpitation, productive cough, gum  bleeding, epistaxis, hematemesis, hemoptysis, abdominal pain, abdominal swelling, early satiety, melena, hematochezia, hematuria, skin rash, spontaneous bleeding, joint swelling, joint pain, heat or cold intolerance, bowel bladder incontinence, back pain, focal motor weakness, paresthesia, depression.    Past Medical History  Diagnosis Date  . Pneumonia 10/2002; 2010    Required INTUBATION for streptococcal pneumonia/septicemia (10/2002)  . HIV disease DX: 01/2002  . Fibroid uterus   . 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 Peripheral neuropathy     resolved.   :    Past Surgical History  Procedure Laterality Date  . No past surgeries    :   CURRENT MEDS: Current Outpatient Prescriptions  Medication Sig Dispense Refill  . abacavir (ZIAGEN) 300 MG tablet Take 2 tablets (600 mg total) by mouth daily.  60 tablet  11  . Dolutegravir Sodium 50 MG TABS Take 50 mg by mouth 2 (two) times daily. Take two tablets daily      . lamivudine (EPIVIR) 100 MG tablet Take 1 tablet (100 mg total) by mouth daily.  30 tablet  11  . promethazine (PHENERGAN) 25 MG tablet Take 1 tablet (25 mg total) by mouth every 6 (six) hours as needed for nausea.  30 tablet  0  . sodium bicarbonate 650 MG tablet Take 1 tablet (650 mg total) by mouth 3 (three) times daily.  90 tablet  1  . dexamethasone (DECADRON)  4 MG tablet Take 10 tablets (40 mg total) by mouth once a week.  200 tablet  2   No current facility-administered medications for this visit.      Allergies  Allergen Reactions  . Aspirin     Upset stomach   . Efavirenz Hives and Rash    (brand name sustiva)  :  History reviewed. No pertinent family  history.:  History   Social History  . Marital Status: Single    Spouse Name: N/A    Number of Children: 0  . Years of Education: N/A   Occupational History  .      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    Social History Main Topics  . Smoking status: Never Smoker   . Smokeless tobacco: Never Used  . Alcohol Use: No  . Drug Use: No  . Sexually Active: Not Currently   Other Topics Concern  . Not on file   Social History Narrative  . No narrative on file  :  REVIEW OF SYSTEM:  The rest of the 14-point review of sytem was negative.   Exam: ECOG 0-1  General:  well-nourished  woman,in no acute distress.  Eyes:  no scleral icterus.  ENT:  There were no oropharyngeal lesions.  Neck was without thyromegaly.  Lymphatics:  Negative cervical, supraclavicular or axillary adenopathy.  Respiratory: lungs were clear bilaterally without wheezing or crackles.  Cardiovascular:  Regular rate and rhythm, S1/S2, without murmur, rub or gallop.  There was no pedal edema.  GI:  abdomen was soft, flat, nontender, nondistended, without organomegaly.  Muscoloskeletal:  no spinal tenderness of palpation of vertebral spine.  Skin exam was without echymosis, petichae.  Neuro exam was nonfocal.  Patient was able to get on and off exam table without assistance.  Gait was normal.  Patient was alerted and oriented.  Attention was good.   Language was appropriate.  Mood was normal without depression.  Speech was not pressured.  Thought content was not tangential.    LABS:  Lab Results  Component Value Date   WBC 8.2 01/23/2013   HGB 6.2* 01/23/2013   HCT 18.4* 01/23/2013   PLT 259 01/23/2013   GLUCOSE 90 01/23/2013   CHOL 174 07/31/2012   TRIG 78 07/31/2012   HDL 40 07/31/2012   LDLCALC 118* 07/31/2012   ALT 20 12/20/2012   AST 26 12/20/2012   NA 137 01/23/2013   K 4.7 01/23/2013   CL 110* 01/23/2013   CREATININE 4.0 Repeated and Verified* 01/23/2013   BUN 19.4 01/23/2013   CO2 18* 01/23/2013   MICROALBUR  48.81* 12/20/2012    ASSESSMENT AND PLAN:    1.  Diagnosis:  IgG kappa, multiple myeloma; presenting with anemia and renal failure.  2.  Causes:  Unknown.  3.  What is it:  Most of the time, a slow growing cancer of the plasma cell in the bone marrow.  Untreated, it can cause:  Hypercalcemia, renal failure, anemia, and bone fracture.  Having any one of these 4 criteria indicates need for treatment.   4.  Stage:  Not classily staged like solid cancer; but rather according to bone marrow cytogenetics and whether there are indication to treat.  5.  Treatment:     *  Oral + SQ chemo:  Revlimid (days 1-21); Velcade SQ once a week (3 weeks on, 1 week off);  Dexamethasone (once a week); every 4 week-cycle.  Given acute kidney problem at this time, I would recommend  starting out with just Velcade and Dexamethasone.  In the future, if your kidney function improves, we may add on Revlimid chemotherapy pill.  Another option is to start Velcade and dexamethasone along with Cytoxan. She would like to hold off the Cytoxan and see how she responds to dexamethasone Velcade alone to improve her kidney function. At that time, we may consider adding on Revlimid.    *  In patients who are reasonably in good health, we may consider autologous bone marrow transplant to improve chance of cure.   6.  Potential side effects of these chemo:  Low blood count, bleeding, infection, neuropathy, skin rash, blood clot.   She expressed informed understanding wished to think about it the next few days. She does have acute worsening of her renal function to multiple myeloma. I strongly advised her to start chemotherapy within the next few days. 7.  Chance of response:  Upward to 70-80% depending on risk stratification.    8.  How to follow response:  M-spike (on SPEP) and serum light chain should decrease with chemo.    9.  Anemia:  Due to multiple myeloma. I recommend 2 units of packed red blood cell transfusion to relieve  symptoms of anemia.  10. Acute kidney failure: Due to multiple myeloma. I wanted to discuss with nephrology to see if plasma pheresis is appropriate to maintain her kidney function while starting chemotherapy. She did not want to undergo pheresis     The length of time of the face-to-face encounter was 60 minutes. More than 50% of time was spent counseling and coordination of care.     Thank you for this referral.      Huan T. Gaylyn Rong, M.D.

## 2013-01-24 ENCOUNTER — Telehealth: Payer: Self-pay | Admitting: Oncology

## 2013-01-24 ENCOUNTER — Encounter (HOSPITAL_COMMUNITY)
Admission: RE | Admit: 2013-01-24 | Discharge: 2013-01-24 | Disposition: A | Discharge status: Home / Self Care | Payer: PRIVATE HEALTH INSURANCE | Source: Ambulatory Visit | Attending: Oncology | Admitting: Oncology

## 2013-01-24 ENCOUNTER — Other Ambulatory Visit: Payer: Self-pay | Admitting: *Deleted

## 2013-01-24 ENCOUNTER — Encounter: Payer: Self-pay | Admitting: *Deleted

## 2013-01-24 ENCOUNTER — Other Ambulatory Visit: Payer: Self-pay

## 2013-01-24 DIAGNOSIS — D4989 Neoplasm of unspecified behavior of other specified sites: Secondary | ICD-10-CM

## 2013-01-24 DIAGNOSIS — C9000 Multiple myeloma not having achieved remission: Secondary | ICD-10-CM | POA: Insufficient documentation

## 2013-01-24 DIAGNOSIS — D649 Anemia, unspecified: Secondary | ICD-10-CM | POA: Insufficient documentation

## 2013-01-24 LAB — BETA 2 MICROGLOBULIN, SERUM: Beta-2 Microglobulin: 13.8 mg/L — ABNORMAL HIGH (ref 1.01–1.73)

## 2013-01-24 LAB — KAPPA/LAMBDA LIGHT CHAINS: Kappa free light chain: 6030 mg/dL — ABNORMAL HIGH (ref 0.33–1.94)

## 2013-01-24 MED ORDER — DEXAMETHASONE 4 MG PO TABS: 40.0000 mg | ORAL_TABLET | ORAL | Status: DC

## 2013-01-24 MED ORDER — PROCHLORPERAZINE MALEATE 10 MG PO TABS: 10.0000 mg | ORAL_TABLET | Freq: Four times a day (QID) | ORAL | Status: DC | PRN

## 2013-01-24 MED ORDER — ACYCLOVIR 400 MG PO TABS: 200.0000 mg | ORAL_TABLET | Freq: Every day | ORAL | Status: DC

## 2013-01-24 NOTE — Telephone Encounter (Signed)
lmonvm for pt re next appt for 4/18 @ 9:45am and also appt for port placement 4/28 @ 9:30am to arrive @ 7:30am. Per desk nurse she has already touch base w/re ched appt today and inf tomorrow. Per desk nurse pt will not make it to our office by 12:30pm but will come after her dental appt for ched and get appts when she comes in.

## 2013-01-24 NOTE — Telephone Encounter (Signed)
Per staff phone call and POF I have schedueld appts.  JMW  

## 2013-01-24 NOTE — Telephone Encounter (Signed)
Pt came in today and was given appt schedule for April thru June and appts for bone survey and port placement.

## 2013-01-24 NOTE — Telephone Encounter (Signed)
Called pt to inform of chemo education class at 12;30 pm today.  Pt states she has conflicting appt and can she come at another time.  Informed her this is the only time available.  Spoke w/ [place] and she agreed to see pt at 2 pm today so she can get her dental care taken care of before starting chemo tomorrow.   Called pt back and informed her ok to come at 2pm and come earlier if she is able.  Informed her of chemo appt tomorrow at 9:45 am for blood transfusion and chemo.  Will get Bone Survey done w/i next week,  Does not have to be done this week per Dr. Gaylyn Rong.   Informed pt of 3 Rxs called to [facility] outpt pharmacy for Acyclovir, Compazine and Decadron.   Rxs will be covered at no cost to pt.Meriel Pica in [employer] said she would call to let the pharmacy know.  Pt verbalized understanding and states will come in at 2 pm,  Earlier if possible.

## 2013-01-25 ENCOUNTER — Ambulatory Visit (HOSPITAL_BASED_OUTPATIENT_CLINIC_OR_DEPARTMENT_OTHER): Payer: No Typology Code available for payment source

## 2013-01-25 ENCOUNTER — Other Ambulatory Visit: Payer: Self-pay | Admitting: *Deleted

## 2013-01-25 ENCOUNTER — Encounter: Payer: Self-pay | Admitting: Oncology

## 2013-01-25 VITALS — BP 143/87 | HR 81 | Temp 97.9°F | Resp 20

## 2013-01-25 DIAGNOSIS — D4989 Neoplasm of unspecified behavior of other specified sites: Secondary | ICD-10-CM

## 2013-01-25 DIAGNOSIS — D649 Anemia, unspecified: Secondary | ICD-10-CM

## 2013-01-25 DIAGNOSIS — Z5112 Encounter for antineoplastic immunotherapy: Secondary | ICD-10-CM

## 2013-01-25 DIAGNOSIS — N289 Disorder of kidney and ureter, unspecified: Secondary | ICD-10-CM

## 2013-01-25 DIAGNOSIS — C9000 Multiple myeloma not having achieved remission: Secondary | ICD-10-CM

## 2013-01-25 MED ORDER — ONDANSETRON HCL 8 MG PO TABS
8.0000 mg | ORAL_TABLET | Freq: Once | ORAL | Status: AC
  Administered 2013-01-25: 8 mg via ORAL

## 2013-01-25 MED ORDER — DIPHENHYDRAMINE HCL 25 MG PO CAPS
25.0000 mg | ORAL_CAPSULE | Freq: Once | ORAL | Status: AC
  Administered 2013-01-25: 25 mg via ORAL

## 2013-01-25 MED ORDER — ACETAMINOPHEN 325 MG PO TABS
650.0000 mg | ORAL_TABLET | Freq: Once | ORAL | Status: AC
  Administered 2013-01-25: 650 mg via ORAL

## 2013-01-25 MED ORDER — SODIUM CHLORIDE 0.9 % IV SOLN
INTRAVENOUS | Status: DC
  Administered 2013-01-25: 11:00:00 via INTRAVENOUS

## 2013-01-25 MED ORDER — BORTEZOMIB CHEMO SQ INJECTION 3.5 MG (2.5MG/ML)
1.3000 mg/m2 | Freq: Once | INTRAMUSCULAR | Status: AC
  Administered 2013-01-25: 2.25 mg via SUBCUTANEOUS
  Filled 2013-01-25: qty 2.25

## 2013-01-25 NOTE — Progress Notes (Signed)
Spoke w/ pt in Infusion room.  She asked if ok to restart on her Aspirin 81 mg daily.  She also asked if Dr. Gaylyn Rong had spoken w/ Dr. Allena Katz yet?   She also wanted to know if she should be on a Kidney diet like she was while in hospital?   Per Dr. Gaylyn Rong ok to take Aspirin 81 mg daily.   He has not s/w Dr. Allena Katz since pt said she did not want to try plasmapheresis.  Pt may follow kidney diet and referral to [facility] made.   Informed pt of Dr. Lodema Pilot reply.  She verbalized understanding.

## 2013-01-25 NOTE — Patient Instructions (Addendum)
[facility] Discharge Instructions for Patients Receiving Chemotherapy  Today you received the following chemotherapy agents Velcade  To help prevent nausea and vomiting after your treatment, we encourage you to take your nausea medication Compazine Begin taking it at bedtime and take it as often as prescribed for the next 24-36 hours.   If you develop nausea and vomiting that is not controlled by your nausea medication, call the clinic. If it is after clinic hours your family physician or the after hours number for the clinic or go to the Emergency Department.   BELOW ARE SYMPTOMS THAT SHOULD BE REPORTED IMMEDIATELY:  *FEVER GREATER THAN 100.5 F  *CHILLS WITH OR WITHOUT FEVER  NAUSEA AND VOMITING THAT IS NOT CONTROLLED WITH YOUR NAUSEA MEDICATION  *UNUSUAL SHORTNESS OF BREATH  *UNUSUAL BRUISING OR BLEEDING  TENDERNESS IN MOUTH AND THROAT WITH OR WITHOUT PRESENCE OF ULCERS  *URINARY PROBLEMS  *BOWEL PROBLEMS  UNUSUAL RASH Items with * indicate a potential emergency and should be followed up as soon as possible.  One of the nurses will contact you 24 hours after your treatment. Please let the nurse know about any problems that you may have experienced. Feel free to call the clinic you have any questions or concerns. The clinic phone number is [phone].   I have been informed and understand all the instructions given to me. I know to contact the clinic, my physician, or go to the Emergency Department if any problems should occur. I do not have any questions at this time, but understand that I may call the clinic during office hours   should I have any questions or need assistance in obtaining follow up care.     Bortezomib injection (Velcade) What is this medicine? BORTEZOMIB (bor TEZ oh mib) is a chemotherapy drug. It slows the growth of cancer cells. This medicine is used to treat multiple myeloma, lymphoma, and other cancers. This medicine may be used  for other purposes; ask your health care provider or pharmacist if you have questions. What should I tell my health care provider before I take this medicine? They need to know if you have any of these conditions: -heart disease -irregular heartbeat -liver disease -low blood counts, like low white blood cells, platelets, or hemoglobin -peripheral neuropathy -taking medicine for blood pressure -an unusual or allergic reaction to bortezomib, mannitol, boron, other medicines, foods, dyes, or preservatives -pregnant or trying to get pregnant -breast-feeding How should I use this medicine? This medicine is for injection into a vein or for injection under the skin. It is given by a health care professional in a hospital or clinic setting. Talk to your pediatrician regarding the use of this medicine in children. Special care may be needed. Overdosage: If you think you have taken too much of this medicine contact a poison control center or emergency room at once. NOTE: This medicine is only for you. Do not share this medicine with others. What if I miss a dose? It is important not to miss your dose. Call your doctor or health care professional if you are unable to keep an appointment. What may interact with this medicine? -medicines for diabetes -medicines to increase blood counts like filgrastim, pegfilgrastim, sargramostim -zalcitabine Talk to your doctor or health care professional before taking any of these medicines: -acetaminophen -aspirin -ibuprofen -ketoprofen -naproxen This list may not describe all possible interactions. Give your health care provider a list of all the medicines, herbs, non-prescription drugs, or dietary supplements you  use. Also tell them if you smoke, drink alcohol, or use illegal drugs. Some items may interact with your medicine. What should I watch for while using this medicine? Visit your doctor for checks on your progress. This drug may make you feel generally  unwell. This is not uncommon, as chemotherapy can affect healthy cells as well as cancer cells. Report any side effects. Continue your course of treatment even though you feel ill unless your doctor tells you to stop. You may get drowsy or dizzy. Do not drive, use machinery, or do anything that needs mental alertness until you know how this medicine affects you. Do not stand or sit up quickly, especially if you are an older patient. This reduces the risk of dizzy or fainting spells. In some cases, you may be given additional medicines to help with side effects. Follow all directions for their use. Call your doctor or health care professional for advice if you get a fever, chills or sore throat, or other symptoms of a cold or flu. Do not treat yourself. This drug decreases your body's ability to fight infections. Try to avoid being around people who are sick. This medicine may increase your risk to bruise or bleed. Call your doctor or health care professional if you notice any unusual bleeding. Be careful brushing and flossing your teeth or using a toothpick because you may get an infection or bleed more easily. If you have any dental work done, tell your dentist you are receiving this medicine. Avoid taking products that contain aspirin, acetaminophen, ibuprofen, naproxen, or ketoprofen unless instructed by your doctor. These medicines may hide a fever. Do not become pregnant while taking this medicine. Women should inform their doctor if they wish to become pregnant or think they might be pregnant. There is a potential for serious side effects to an unborn child. Talk to your health care professional or pharmacist for more information. Do not breast-feed an infant while taking this medicine. You may have vomiting or diarrhea while taking this medicine. Drink water or other fluids as directed. What side effects may I notice from receiving this medicine? Side effects that you should report to your doctor or  health care professional as soon as possible: -allergic reactions like skin rash, itching or hives, swelling of the face, lips, or tongue -breathing problems -changes in hearing -changes in vision -fast, irregular heartbeat -feeling faint or lightheaded, falls -pain, tingling, numbness in the hands or feet -seizures -swelling of the ankles, feet, hands -unusual bleeding or bruising -unusually weak or tired -vomiting Side effects that usually do not require medical attention (report to your doctor or health care professional if they continue or are bothersome): -changes in emotions or moods -constipation -diarrhea -loss of appetite -headache -irritation at site where injected -nausea This list may not describe all possible side effects. Call your doctor for medical advice about side effects. You may report side effects to FDA at 1-800-FDA-1088. Where should I keep my medicine? This drug is given in a hospital or clinic and will not be stored at home. NOTE: This sheet is a summary. It may not cover all possible information. If you have questions about this medicine, talk to your doctor, pharmacist, or health care provider.  2012, [employer]/Gold Standard. (11/03/2010 11:42:36 AM)  Prochlorperazine tablets (Compazine) What is this medicine? PROCHLORPERAZINE (proe klor PER a zeen) helps to control severe nausea and vomiting. This medicine is also used to treat schizophrenia. It can also help patients who experience anxiety that  is not due to psychological illness. This medicine may be used for other purposes; ask your health care provider or pharmacist if you have questions. What should I tell my health care provider before I take this medicine? They need to know if you have any of these conditions: -blood disorders or disease -dementia -liver disease or jaundice -Parkinson's disease -uncontrollable movement disorder -an unusual or allergic reaction to prochlorperazine, other  medicines, foods, dyes, or preservatives -pregnant or trying to get pregnant -breast-feeding How should I use this medicine? Take this medicine by mouth with a glass of water. Follow the directions on the prescription label. Take your doses at regular intervals. Do not take your medicine more often than directed. Do not stop taking this medicine suddenly. This can cause nausea, vomiting, and dizziness. Ask your doctor or health care professional for advice. Talk to your pediatrician regarding the use of this medicine in children. Special care may be needed. While this drug may be prescribed for children as young as 2 years for selected conditions, precautions do apply. Overdosage: If you think you have taken too much of this medicine contact a poison control center or emergency room at once. NOTE: This medicine is only for you. Do not share this medicine with others. What if I miss a dose? If you miss a dose, take it as soon as you can. If it is almost time for your next dose, take only that dose. Do not take double or extra doses. What may interact with this medicine? Do not take this medicine with any of the following medications: -amoxapine -antidepressants like citalopram, escitalopram, fluoxetine, paroxetine, and sertraline -deferoxamine -dofetilide -maprotiline -tricyclic antidepressants like amitriptyline, clomipramine, imipramine, nortiptyline and others This medicine may also interact with the following medications: -lithium -medicines for pain -phenytoin -propranolol -warfarin This list may not describe all possible interactions. Give your health care provider a list of all the medicines, herbs, non-prescription drugs, or dietary supplements you use. Also tell them if you smoke, drink alcohol, or use illegal drugs. Some items may interact with your medicine. What should I watch for while using this medicine? Visit your doctor or health care professional for regular checks on your  progress. You may get drowsy or dizzy. Do not drive, use machinery, or do anything that needs mental alertness until you know how this medicine affects you. Do not stand or sit up quickly, especially if you are an older patient. This reduces the risk of dizzy or fainting spells. Alcohol may interfere with the effect of this medicine. Avoid alcoholic drinks. This medicine can reduce the response of your body to heat or cold. Try not to get overheated. Avoid temperature extremes, such as saunas, hot tubs, or very hot or cold baths or showers. Dress warmly in cold weather. This medicine can make you more sensitive to the sun. Keep out of the sun. If you cannot avoid being in the sun, wear protective clothing and use sunscreen. Do not use sun lamps or tanning beds/booths. Your mouth may get dry. Chewing sugarless gum or sucking hard candy, and drinking plenty of water may help. Contact your doctor if the problem does not go away or is severe. What side effects may I notice from receiving this medicine? Side effects that you should report to your doctor or health care professional as soon as possible: -blurred vision -breast enlargement in men or women -breast milk in women who are not breast-feeding -chest pain, fast or irregular heartbeat -confusion, restlessness -dark yellow  or brown urine -difficulty breathing or swallowing -dizziness or fainting spells -drooling, shaking, movement difficulty (shuffling walk) or rigidity -fever, chills, sore throat -involuntary or uncontrollable movements of the eyes, mouth, head, arms, and legs -seizures -stomach area pain -unusually weak or tired -unusual bleeding or bruising -yellowing of skin or eyes Side effects that usually do not require medical attention (report to your doctor or health care professional if they continue or are bothersome): -difficulty passing urine -difficulty sleeping -headache -sexual dysfunction -skin rash, or itching This  list may not describe all possible side effects. Call your doctor for medical advice about side effects. You may report side effects to FDA at 1-800-FDA-1088. Where should I keep my medicine? Keep out of the reach of children. Store at room temperature between 15 and 30 degrees C (59 and 86 degrees F). Protect from light. Throw away any unused medicine after the expiration date. NOTE: This sheet is a summary. It may not cover all possible information. If you have questions about this medicine, talk to your doctor, pharmacist, or health care provider.  2012, [employer]/Gold Standard. (04/28/2008 4:26:50 PM)   Dexamethasone injection What is this medicine? DEXAMETHASONE (dex a METH a sone) is a corticosteroid. It is used to treat inflammation of the skin, joints, lungs, and other organs. Common conditions treated include asthma, allergies, and arthritis. It is also used for other conditions, like blood disorders and diseases of the adrenal glands. This medicine may be used for other purposes; ask your health care provider or pharmacist if you have questions. What should I tell my health care provider before I take this medicine? They need to know if you have any of these conditions: -blood clotting problems -Cushing's syndrome -diabetes -glaucoma -heart problems or disease -high blood pressure -infection like herpes, measles, tuberculosis, or chickenpox -kidney disease -liver disease -mental problems -myasthenia gravis -osteoporosis -previous heart attack -seizures -stomach, ulcer or intestine disease including colitis and diverticulitis -thyroid problem -an unusual or allergic reaction to dexamethasone, corticosteroids, other medicines, lactose, foods, dyes, or preservatives -pregnant or trying to get pregnant -breast-feeding How should I use this medicine? This medicine is for injection into a muscle, joint, lesion, soft tissue, or vein. It is given by a health care professional in a  hospital or clinic setting. Talk to your pediatrician regarding the use of this medicine in children. Special care may be needed. Overdosage: If you think you have taken too much of this medicine contact a poison control center or emergency room at once. NOTE: This medicine is only for you. Do not share this medicine with others. What if I miss a dose? This may not apply. If you are having a series of injections over a prolonged period, try not to miss an appointment. Call your doctor or health care professional to reschedule if you are unable to keep an appointment. What may interact with this medicine? Do not take this medicine with any of the following medications: -mifepristone, RU-486 -vaccines This medicine may also interact with the following medications: -amphotericin B -antibiotics like clarithromycin, erythromycin, and troleandomycin -aspirin and aspirin-like drugs -barbiturates like phenobarbital -carbamazepine -cholestyramine -cholinesterase inhibitors like donepezil, galantamine, rivastigmine, and tacrine -cyclosporine -digoxin -diuretics -ephedrine -female hormones, like estrogens or progestins and birth control pills -indinavir -isoniazid -ketoconazole -medicines for diabetes -medicines that improve muscle tone or strength for conditions like myasthenia gravis -NSAIDs, medicines for pain and inflammation, like ibuprofen or naproxen -phenytoin -rifampin -thalidomide -warfarin This list may not describe all possible interactions. Give your health  care provider a list of all the medicines, herbs, non-prescription drugs, or dietary supplements you use. Also tell them if you smoke, drink alcohol, or use illegal drugs. Some items may interact with your medicine. What should I watch for while using this medicine? Your condition will be monitored carefully while you are receiving this medicine. If you are taking this medicine for a long time, carry an identification card  with your name and address, the type and dose of your medicine, and your doctor's name and address. This medicine may increase your risk of getting an infection. Stay away from people who are sick. Tell your doctor or health care professional if you are around anyone with measles or chickenpox. Talk to your health care provider before you get any vaccines that you take this medicine. If you are going to have surgery, tell your doctor or health care professional that you have taken this medicine within the last twelve months. Ask your doctor or health care professional about your diet. You may need to lower the amount of salt you eat. The medicine can increase your blood sugar. If you are a diabetic check with your doctor if you need help adjusting the dose of your diabetic medicine. What side effects may I notice from receiving this medicine? Side effects that you should report to your doctor or health care professional as soon as possible: -allergic reactions like skin rash, itching or hives, swelling of the face, lips, or tongue -black or tarry stools -change in the amount of urine -changes in vision -confusion, excitement, restlessness, a false sense of well-being -fever, sore throat, sneezing, cough, or other signs of infection, wounds that will not heal -hallucinations -increased thirst -mental depression, mood swings, mistaken feelings of self importance or of being mistreated -pain in hips, back, ribs, arms, shoulders, or legs -pain, redness, or irritation at the injection site -redness, blistering, peeling or loosening of the skin, including inside the mouth -rounding out of face -swelling of feet or lower legs -unusual bleeding or bruising -unusual tired or weak -wounds that do not heal Side effects that usually do not require medical attention (report to your doctor or health care professional if they continue or are bothersome): -diarrhea or constipation -change in  taste -headache -nausea, vomiting -skin problems, acne, thin and shiny skin -touble sleeping -unusual growth of hair on the face or body -weight gain This list may not describe all possible side effects. Call your doctor for medical advice about side effects. You may report side effects to FDA at 1-800-FDA-1088. Where should I keep my medicine? This drug is given in a hospital or clinic and will not be stored at home. NOTE: This sheet is a summary. It may not cover all possible information. If you have questions about this medicine, talk to your doctor, pharmacist, or health care provider.  2012, [employer]/Gold Standard. (01/17/2008 2:04:12 PM)  Blood Transfusion  A blood transfusion replaces your blood or some of its parts. Blood is replaced when you have lost blood because of surgery, an accident, or for severe blood conditions like anemia. You can donate blood to be used on yourself if you have a planned surgery. If you lose blood during that surgery, your own blood can be given back to you. Any blood given to you is checked to make sure it matches your blood type. Your temperature, blood pressure, and heart rate (vital signs) will be checked often.  GET HELP RIGHT AWAY IF:   You feel sick  to your stomach (nauseous) or throw up (vomit).  You have watery poop (diarrhea).  You have shortness of breath or trouble breathing.  You have blood in your pee (urine) or have dark colored pee.  You have chest pain or tightness.  Your eyes or skin turn yellow (jaundice).  You have a temperature by mouth above 102 F (38.9 C), not controlled by medicine.  You start to shake and have chills.  You develop a a red rash (hives) or feel itchy.  You develop lightheadedness or feel confused.  You develop back, joint, or muscle pain.  You do not feel hungry (lost appetite).  You feel tired, restless, or nervous.  You develop belly (abdominal) cramps. Document Released: 12/23/2008 Document  Revised: 12/19/2011 Document Reviewed: 12/23/2008 [facility] Patient Information 2013 [place], [place].

## 2013-01-25 NOTE — Progress Notes (Signed)
Patient bought back in her tax papers to get assistance. I will fwd to Elizabeth/Ebony. She became upset when asked if sister can do letter of support. She thought maybe sister would be responsible for her bills. We advised her no sister would not be resp, then she said she had her taxes--she bought back in today. She is no longer working now and has applied for Affortable care Act. EPP good thru 06/2013.

## 2013-01-26 LAB — TYPE AND SCREEN
ABO/RH(D): A POS
Unit division: 0

## 2013-01-28 ENCOUNTER — Telehealth: Payer: Self-pay | Admitting: *Deleted

## 2013-01-28 ENCOUNTER — Ambulatory Visit (HOSPITAL_COMMUNITY)
Admission: RE | Admit: 2013-01-28 | Discharge: 2013-01-28 | Disposition: A | Discharge status: Home / Self Care | Payer: Self-pay | Source: Ambulatory Visit | Attending: Oncology | Admitting: Oncology

## 2013-01-28 ENCOUNTER — Ambulatory Visit: Payer: Self-pay

## 2013-01-28 ENCOUNTER — Ambulatory Visit: Payer: Self-pay | Admitting: Oncology

## 2013-01-28 ENCOUNTER — Other Ambulatory Visit: Payer: Self-pay | Admitting: Lab

## 2013-01-28 DIAGNOSIS — M899 Disorder of bone, unspecified: Secondary | ICD-10-CM | POA: Insufficient documentation

## 2013-01-28 DIAGNOSIS — C9000 Multiple myeloma not having achieved remission: Secondary | ICD-10-CM | POA: Insufficient documentation

## 2013-01-28 DIAGNOSIS — M5030 Other cervical disc degeneration, unspecified cervical region: Secondary | ICD-10-CM | POA: Insufficient documentation

## 2013-01-28 DIAGNOSIS — D4989 Neoplasm of unspecified behavior of other specified sites: Secondary | ICD-10-CM

## 2013-01-28 DIAGNOSIS — M25559 Pain in unspecified hip: Secondary | ICD-10-CM | POA: Insufficient documentation

## 2013-01-28 NOTE — Telephone Encounter (Signed)
Message copied by Kathlynn Grate on Mon Jan 28, 2013  5:30 PM ------      Message from: Kallie Locks      Created: Fri Jan 25, 2013  3:46 PM      Regarding: "1st time chemotherapy"       Patient received 1st time chemotherapy SQ Velcade today, per Dr. Gaylyn Rong.; patient also received 2 units PRBCs today; tolerated treatment well. ------

## 2013-01-30 ENCOUNTER — Encounter (HOSPITAL_COMMUNITY): Payer: Self-pay | Admitting: Pharmacy Technician

## 2013-01-31 ENCOUNTER — Other Ambulatory Visit: Payer: Self-pay | Admitting: Radiology

## 2013-01-31 ENCOUNTER — Ambulatory Visit (HOSPITAL_COMMUNITY): Payer: Self-pay | Attending: Infectious Disease

## 2013-01-31 ENCOUNTER — Other Ambulatory Visit: Payer: Self-pay | Admitting: Oncology

## 2013-01-31 DIAGNOSIS — D4989 Neoplasm of unspecified behavior of other specified sites: Secondary | ICD-10-CM

## 2013-02-01 ENCOUNTER — Telehealth: Payer: Self-pay | Admitting: Oncology

## 2013-02-01 ENCOUNTER — Ambulatory Visit (HOSPITAL_BASED_OUTPATIENT_CLINIC_OR_DEPARTMENT_OTHER): Payer: No Typology Code available for payment source | Admitting: Oncology

## 2013-02-01 ENCOUNTER — Ambulatory Visit (HOSPITAL_BASED_OUTPATIENT_CLINIC_OR_DEPARTMENT_OTHER): Payer: No Typology Code available for payment source

## 2013-02-01 ENCOUNTER — Other Ambulatory Visit (HOSPITAL_BASED_OUTPATIENT_CLINIC_OR_DEPARTMENT_OTHER): Payer: No Typology Code available for payment source

## 2013-02-01 VITALS — BP 147/89 | HR 83 | Temp 97.1°F | Resp 20 | Ht 63.0 in | Wt 154.3 lb

## 2013-02-01 DIAGNOSIS — B20 Human immunodeficiency virus [HIV] disease: Secondary | ICD-10-CM

## 2013-02-01 DIAGNOSIS — N189 Chronic kidney disease, unspecified: Secondary | ICD-10-CM

## 2013-02-01 DIAGNOSIS — C9000 Multiple myeloma not having achieved remission: Secondary | ICD-10-CM

## 2013-02-01 DIAGNOSIS — Z5112 Encounter for antineoplastic immunotherapy: Secondary | ICD-10-CM

## 2013-02-01 DIAGNOSIS — M899 Disorder of bone, unspecified: Secondary | ICD-10-CM

## 2013-02-01 DIAGNOSIS — D4989 Neoplasm of unspecified behavior of other specified sites: Secondary | ICD-10-CM

## 2013-02-01 LAB — CBC WITH DIFFERENTIAL/PLATELET
Basophils Absolute: 0 10*3/uL (ref 0.0–0.1)
Eosinophils Absolute: 0.1 10*3/uL (ref 0.0–0.5)
HCT: 26.7 % — ABNORMAL LOW (ref 34.8–46.6)
HGB: 9 g/dL — ABNORMAL LOW (ref 11.6–15.9)
LYMPH%: 28.7 % (ref 14.0–49.7)
MCV: 97.7 fL (ref 79.5–101.0)
MONO#: 0.5 10*3/uL (ref 0.1–0.9)
MONO%: 7 % (ref 0.0–14.0)
NEUT#: 4 10*3/uL (ref 1.5–6.5)
Platelets: 227 10*3/uL (ref 145–400)
RBC: 2.74 10*6/uL — ABNORMAL LOW (ref 3.70–5.45)
WBC: 6.5 10*3/uL (ref 3.9–10.3)

## 2013-02-01 LAB — BASIC METABOLIC PANEL (CC13)
BUN: 26.9 mg/dL — ABNORMAL HIGH (ref 7.0–26.0)
CO2: 19 mEq/L — ABNORMAL LOW (ref 22–29)
Calcium: 9.5 mg/dL (ref 8.4–10.4)
Glucose: 113 mg/dl — ABNORMAL HIGH (ref 70–99)
Sodium: 140 mEq/L (ref 136–145)

## 2013-02-01 MED ORDER — BORTEZOMIB CHEMO SQ INJECTION 3.5 MG (2.5MG/ML)
1.3000 mg/m2 | Freq: Once | INTRAMUSCULAR | Status: AC
  Administered 2013-02-01: 2.25 mg via SUBCUTANEOUS
  Filled 2013-02-01: qty 2.25

## 2013-02-01 MED ORDER — ONDANSETRON HCL 8 MG PO TABS
8.0000 mg | ORAL_TABLET | Freq: Once | ORAL | Status: AC
  Administered 2013-02-01: 8 mg via ORAL

## 2013-02-01 NOTE — Telephone Encounter (Signed)
gv pt appt schedule for April thru June. °

## 2013-02-01 NOTE — Patient Instructions (Addendum)
[facility] Discharge Instructions for Patients Receiving Chemotherapy  Today you received the following chemotherapy agents velcade.  To help prevent nausea and vomiting after your treatment, we encourage you to take your nausea medication zofran. Begin taking it tonight and take it as often as prescribed.   If you develop nausea and vomiting that is not controlled by your nausea medication, call the clinic. If it is after clinic hours your family physician or the after hours number for the clinic or go to the Emergency Department.   BELOW ARE SYMPTOMS THAT SHOULD BE REPORTED IMMEDIATELY:  *FEVER GREATER THAN 100.5 F  *CHILLS WITH OR WITHOUT FEVER  NAUSEA AND VOMITING THAT IS NOT CONTROLLED WITH YOUR NAUSEA MEDICATION  *UNUSUAL SHORTNESS OF BREATH  *UNUSUAL BRUISING OR BLEEDING  TENDERNESS IN MOUTH AND THROAT WITH OR WITHOUT PRESENCE OF ULCERS  *URINARY PROBLEMS  *BOWEL PROBLEMS  UNUSUAL RASH Items with * indicate a potential emergency and should be followed up as soon as possible. . Feel free to call the clinic you have any questions or concerns. The clinic phone number is [phone].   I have been informed and understand all the instructions given to me. I know to contact the clinic, my physician, or go to the Emergency Department if any problems should occur. I do not have any questions at this time, but understand that I may call the clinic during office hours   should I have any questions or need assistance in obtaining follow up care.    __________________________________________  _____________  __________ Signature of Patient or Authorized Representative            Date                   Time    __________________________________________ Nurse's Signature

## 2013-02-04 ENCOUNTER — Other Ambulatory Visit: Payer: Self-pay | Admitting: Oncology

## 2013-02-04 ENCOUNTER — Ambulatory Visit (HOSPITAL_COMMUNITY)
Admission: RE | Admit: 2013-02-04 | Discharge: 2013-02-04 | Disposition: A | Discharge status: Home / Self Care | Payer: PRIVATE HEALTH INSURANCE | Source: Ambulatory Visit | Attending: Oncology | Admitting: Oncology

## 2013-02-04 ENCOUNTER — Encounter (HOSPITAL_COMMUNITY): Payer: Self-pay

## 2013-02-04 DIAGNOSIS — B20 Human immunodeficiency virus [HIV] disease: Secondary | ICD-10-CM | POA: Insufficient documentation

## 2013-02-04 DIAGNOSIS — D4989 Neoplasm of unspecified behavior of other specified sites: Secondary | ICD-10-CM

## 2013-02-04 DIAGNOSIS — Z79899 Other long term (current) drug therapy: Secondary | ICD-10-CM | POA: Insufficient documentation

## 2013-02-04 DIAGNOSIS — C9000 Multiple myeloma not having achieved remission: Secondary | ICD-10-CM

## 2013-02-04 LAB — CBC
HCT: 23.7 % — ABNORMAL LOW (ref 36.0–46.0)
Hemoglobin: 7.9 g/dL — ABNORMAL LOW (ref 12.0–15.0)
MCH: 32.5 pg (ref 26.0–34.0)
MCV: 97.5 fL (ref 78.0–100.0)
RBC: 2.43 MIL/uL — ABNORMAL LOW (ref 3.87–5.11)

## 2013-02-04 LAB — BASIC METABOLIC PANEL
BUN: 35 mg/dL — ABNORMAL HIGH (ref 6–23)
CO2: 21 mEq/L (ref 19–32)
Chloride: 107 mEq/L (ref 96–112)
Glucose, Bld: 89 mg/dL (ref 70–99)
Potassium: 4.5 mEq/L (ref 3.5–5.1)

## 2013-02-04 MED ORDER — MIDAZOLAM HCL 2 MG/2ML IJ SOLN
INTRAMUSCULAR | Status: AC
  Filled 2013-02-04: qty 4

## 2013-02-04 MED ORDER — SODIUM CHLORIDE 0.9 % IV SOLN
Freq: Once | INTRAVENOUS | Status: AC
  Administered 2013-02-04: 08:00:00 via INTRAVENOUS

## 2013-02-04 MED ORDER — MIDAZOLAM HCL 2 MG/2ML IJ SOLN
INTRAMUSCULAR | Status: AC | PRN
  Administered 2013-02-04: 1 mg via INTRAVENOUS
  Administered 2013-02-04: 2 mg via INTRAVENOUS

## 2013-02-04 MED ORDER — FENTANYL CITRATE 0.05 MG/ML IJ SOLN
INTRAMUSCULAR | Status: AC
  Filled 2013-02-04: qty 4

## 2013-02-04 MED ORDER — CEFAZOLIN SODIUM 1-5 GM-% IV SOLN
1.0000 g | Freq: Once | INTRAVENOUS | Status: AC
  Administered 2013-02-04: 1 g via INTRAVENOUS
  Filled 2013-02-04: qty 50

## 2013-02-04 MED ORDER — FENTANYL CITRATE 0.05 MG/ML IJ SOLN
INTRAMUSCULAR | Status: AC | PRN
  Administered 2013-02-04: 100 ug via INTRAVENOUS
  Administered 2013-02-04: 50 ug via INTRAVENOUS

## 2013-02-04 MED ORDER — HEPARIN SOD (PORK) LOCK FLUSH 100 UNIT/ML IV SOLN
500.0000 [IU] | Freq: Once | INTRAVENOUS | Status: AC
  Administered 2013-02-04: 500 [IU] via INTRAVENOUS

## 2013-02-04 NOTE — H&P (Signed)
Jennifer Boyle is an 47 y.o. female.   Chief Complaint: "I'm having a port put in " HPI: Patient with history of HIV and multiple myeloma presents today for port a cath placement for chemotherapy.   Past Medical History  Diagnosis Date  . Pneumonia 10/2002; 2010    Required INTUBATION for streptococcal pneumonia/septicemia (10/2002)  . HIV disease DX: 01/2002  . Fibroid uterus   . 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 Peripheral neuropathy     resolved.     Past Surgical History  Procedure Laterality Date  . No past surgeries      History reviewed. No pertinent family history. Social History:  reports that she has never smoked. She has never used smokeless tobacco. She reports that she does not drink alcohol or use illicit drugs.  Allergies:  Allergies  Allergen Reactions  . Aspirin     Upset stomach   . Efavirenz Hives and Rash    (brand name sustiva)    Current outpatient prescriptions:abacavir (ZIAGEN) 300 MG tablet, Take 2 tablets (600 mg total) by mouth daily., Disp: 60 tablet, Rfl: 11;  acyclovir (ZOVIRAX) 400 MG tablet, Take 0.5 tablets (200 mg total) by mouth daily., Disp: 60 tablet, Rfl: 3;  Dolutegravir Sodium 50 MG TABS, Take 50 mg by mouth 2 (two) times daily. , Disp: , Rfl: ;  lamivudine (EPIVIR) 100 MG tablet, Take 1 tablet (100 mg total) by mouth daily., Disp: 30 tablet, Rfl: 11 dexamethasone (DECADRON) 4 MG tablet, Take 10 tablets (40 mg total) by mouth once a week., Disp: 200 tablet, Rfl: 2;  prochlorperazine (COMPAZINE) 10 MG tablet, Take 1 tablet (10 mg total) by mouth every 6 (six) hours as needed (Nausea or vomiting)., Disp: 30 tablet, Rfl: 1;  promethazine (PHENERGAN) 25 MG  tablet, Take 1 tablet (25 mg total) by mouth every 6 (six) hours as needed for nausea., Disp: 30 tablet, Rfl: 0 sodium bicarbonate 650 MG tablet, Take 1 tablet (650 mg total) by mouth 3 (three) times daily., Disp: 90 tablet, Rfl: 1 Current facility-administered medications:ceFAZolin (ANCEF) IVPB 1 g/50 mL premix, 1 g, Intravenous, Once, Robet Leu, PA-C;  fentaNYL (SUBLIMAZE) 0.05 MG/ML injection, , , , ;  midazolam (VERSED) 2 MG/2ML injection, , , ,    Results for orders placed during the hospital encounter of 02/04/13 (from the past 48 hour(s))  APTT     Status: Abnormal   Collection Time    02/04/13  8:15 AM      Result Value Range   aPTT 20 (*) 24 - 37 seconds  CBC     Status: Abnormal   Collection Time    02/04/13  8:15 AM      Result Value Range   WBC 6.7  4.0 - 10.5 K/uL   RBC 2.43 (*) 3.87 - 5.11 MIL/uL   Hemoglobin 7.9 (*) 12.0 - 15.0 g/dL   HCT 81.1 (*) 91.4 - 78.2 %   MCV 97.5  78.0 - 100.0 fL   MCH 32.5  26.0 - 34.0 pg   MCHC 33.3  30.0 - 36.0 g/dL   RDW 95.6 (*) 21.3 - 08.6 %   Platelets 211  150 - 400 K/uL  PROTIME-INR     Status: None   Collection Time    02/04/13  8:15 AM      Result Value Range   Prothrombin Time 14.3  11.6 - 15.2 seconds   INR 1.13  0.00 - 1.49  BASIC METABOLIC PANEL     Status: Abnormal   Collection Time    02/04/13  8:50 AM      Result Value Range   Sodium 136  135 - 145 mEq/L   Potassium 4.5  3.5 - 5.1 mEq/L   Chloride 107  96 - 112 mEq/L   CO2 21  19 - 32 mEq/L   Glucose, Bld 89  70 - 99 mg/dL   BUN 35 (*) 6 - 23 mg/dL   Creatinine, Ser 1.61 (*) 0.50 - 1.10 mg/dL   Calcium 9.2  8.4 - 09.6 mg/dL   GFR calc non Af Amer 12 (*) >90 mL/min   GFR calc Af Amer 14 (*) >90 mL/min   Comment:            The eGFR has been calculated     using the CKD EPI equation.     This calculation has not been     validated in all clinical     situations.     eGFR's persistently     <90 mL/min signify     possible Chronic Kidney Disease.   No  results found.  Review of Systems  Constitutional: Negative for fever and chills.  HENT: Negative for sore throat.   Respiratory: Negative for cough and shortness of breath.   Cardiovascular: Negative for chest pain.  Gastrointestinal: Negative for nausea, vomiting and abdominal pain.  Musculoskeletal: Positive for joint pain.  Neurological: Negative for headaches.  Endo/Heme/Allergies: Does not bruise/bleed easily.   Vitals:   BP 149/82  HR 76  R 18  TEMP 97.8  O2 SATS 100% RA Last menstrual period 01/04/2013. Physical Exam  Constitutional: She is oriented to person, place, and time. She appears well-developed and well-nourished.  Cardiovascular: Normal rate and regular rhythm.   Respiratory: Effort normal and breath sounds normal.  GI: Soft. Bowel sounds are normal.  Musculoskeletal: Normal range of motion.  Neurological: She is alert and oriented to person, place, and time.     Assessment/Plan Pt with hx of HIV and multiple myeloma. Plan is for port a cath placement today for chemotherapy. Details/risks of procedure d/w pt with her understanding and consent.  ALLRED,D KEVIN 02/04/2013, 9:31 AM

## 2013-02-04 NOTE — Procedures (Signed)
Successful placement of right IJ approach port-a-cath with tip at the superior caval atrial junction. The catheter is ready for immediate use. No immediate post procedural complications. 

## 2013-02-05 ENCOUNTER — Ambulatory Visit: Payer: Self-pay | Admitting: Nutrition

## 2013-02-05 ENCOUNTER — Encounter: Payer: Self-pay | Admitting: Oncology

## 2013-02-05 NOTE — Progress Notes (Signed)
[facility]  Telephone:([phone] Fax:([phone]     INITIAL HEMATOLOGY CONSULTATION    Referral MD:  Dr. [name],  M.D.   Reason for Referral: IgG kappa multiple myeloma; presented with anemia, acute renal failure. Initial M spike was 5.15 g/dL; IgG 7040 mg/dL; positive Bence Jones protein. Bone marrow biopsy on 01/11/2013  showed 40% plasma cell. Cytogenetics and myeloma FISH panel are pending.     HPI: Ms. Jennifer Boyle is a 47 year old African American woman with history of HIV/AIDS. She was in usual sterile health until March 2014 when she developed progressive shortness of breath, dyspnea on exertion. She was found to have severe anemia and acute renal failure. Worked up in the hospital showed she had positive Bence Jones protein. She underwent diagnostic bone marrow biopsy that was arranged by her infectious disease physician on 01/11/2013 with pathology showing multiple myeloma. She was therefore kindly referred to the cancer Center for evaluation.   Jennifer Boyle presented to the clinic today the first time by herself.   she reported that she still has dyspnea exertion. This is stable. She denies chest pain, hemoptysis, gum bleeding, melena, hematochezia, vaginal bleeding, hematuria. She has mild bilateral rib pains depending on her sleeping position. She denies severe low back pain, bowel bladder incontinence, low shotty paresthesia. She reports feeling anxious with a new diagnosis of multiple myeloma. The only family member she has in the [place] is her sister who lives in [place].  The patient used to work as a Tour manager. Since the diagnosis of multiple myeloma, she has not been working.  Patient denies fever, anorexia, weight loss, fatigue, headache, visual changes, confusion, drenching night sweats, palpable lymph node swelling, mucositis, odynophagia, dysphagia, nausea vomiting, jaundice, chest pain, palpitation, productive cough, gum  bleeding, epistaxis, hematemesis, hemoptysis, abdominal pain, abdominal swelling, early satiety, melena, hematochezia, hematuria, skin rash, spontaneous bleeding, joint swelling, joint pain, heat or cold intolerance, bowel bladder incontinence, back pain, focal motor weakness, paresthesia, depression.    Past Medical History  Diagnosis Date  . Pneumonia 10/2002; 2010    Required INTUBATION for streptococcal pneumonia/septicemia (10/2002)  . HIV disease DX: 01/2002  . Fibroid uterus   . 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 Peripheral neuropathy     resolved.   :    Past Surgical History  Procedure Laterality Date  . No past surgeries    :   CURRENT MEDS: Current Outpatient Prescriptions  Medication Sig Dispense Refill  . abacavir (ZIAGEN) 300 MG tablet Take 2 tablets (600 mg total) by mouth daily.  60 tablet  11  . acyclovir (ZOVIRAX) 400 MG tablet Take 0.5 tablets (200 mg total) by mouth daily.  60 tablet  3  . dexamethasone (DECADRON) 4 MG tablet Take 10 tablets (40 mg total) by mouth once a week.  200 tablet  2  . Dolutegravir Sodium 50 MG TABS Take 50 mg by mouth 2 (two) times daily.       [name] lamivudine (EPIVIR) 100 MG tablet Take 1 tablet (100 mg total) by mouth daily.  30 tablet  11  . prochlorperazine (COMPAZINE) 10 MG tablet Take 1 tablet (10 mg total) by mouth  every 6 (six) hours as needed (Nausea or vomiting).  30 tablet  1  . promethazine (PHENERGAN) 25 MG tablet Take 1 tablet (25 mg total) by mouth every 6 (six) hours as needed for nausea.  30 tablet  0  . sodium bicarbonate 650 MG tablet Take 1 tablet (650 mg total) by mouth 3 (three) times daily.  90 tablet  1   No current  facility-administered medications for this visit.      Allergies  Allergen Reactions  . Aspirin     Upset stomach   . Efavirenz Hives and Rash    (brand name sustiva)  :  No family history on file.:  History   Social History  . Marital Status: Single    Spouse Name: N/A    Number of Children: 0  . Years of Education: N/A   Occupational History  .      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    Social History Main Topics  . Smoking status: Never Smoker   . Smokeless tobacco: Never Used  . Alcohol Use: No  . Drug Use: No  . Sexually Active: Not Currently   Other Topics Concern  . Not on file   Social History Narrative  . No narrative on file  :  REVIEW OF SYSTEM:  The rest of the 14-point review of sytem was negative.   Exam: ECOG 0-1  General:  well-nourished  woman,in no acute distress.  Eyes:  no scleral icterus.  ENT:  There were no oropharyngeal lesions.  Neck was without thyromegaly.  Lymphatics:  Negative cervical, supraclavicular or axillary adenopathy.  Respiratory: lungs were clear bilaterally without wheezing or crackles.  Cardiovascular:  Regular rate and rhythm, S1/S2, without murmur, rub or gallop.  There was no pedal edema.  GI:  abdomen was soft, flat, nontender, nondistended, without organomegaly.  Muscoloskeletal:  no spinal tenderness of palpation of vertebral spine.  Skin exam was without echymosis, petichae.  Neuro exam was nonfocal.  Patient was able to get on and off exam table without assistance.  Gait was normal.  Patient was alerted and oriented.  Attention was good.   Language was appropriate.  Mood was normal without depression.  Speech was not pressured.  Thought content was not tangential.    LABS:  Lab Results  Component Value Date   WBC 6.7 02/04/2013   HGB 7.9* 02/04/2013   HCT 23.7* 02/04/2013   PLT 211 02/04/2013   GLUCOSE 89 02/04/2013   CHOL 174 07/31/2012   TRIG 78 07/31/2012   HDL 40 07/31/2012   LDLCALC 118* 07/31/2012   ALT 20  12/20/2012   AST 26 12/20/2012   NA 136 02/04/2013   K 4.5 02/04/2013   CL 107 02/04/2013   CREATININE 4.01* 02/04/2013   BUN 35* 02/04/2013   CO2 21 02/04/2013   INR 1.13 02/04/2013   MICROALBUR 48.81* 12/20/2012    ASSESSMENT AND PLAN:   1.  Multiple myeloma: - She was started last week on Velcade/Dexamethasone.  She is doing well without major complication.  I recommended to proceed with cycl#1, day #8 today without dose modification.  At the end of this cycle of chemo, if her renal function is stable (albeited compromised), I will add on Revlimid.  If her renal function fluctuate, I may consider Cytoxan instead of Revlimid. I prefer to add either Rituxan or Cytoxan after radiation to decrease risk of cytopenia.   To prevent risk of herpes reactivation, I advise her to  continue Acyclovir.   2.  Bone lesions:  Due to myeloma.  She does not want pain met.  She would benefit from radiating the painful rib lesions.  I referred her to Rad Onc.  I recommended that she sees her dentist before receiving Zometa.   3.  HIV/AIDS:  On abacavir, Dolutegravir, Lamuvidine per ID.   4.  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  Cr is stable today.   5.  Follow up:  In about 1 week for cycle#1, day #15 of chemo.  I will see her myself before the 2nd cycle of chemo.       Corsica Franson T. Gaylyn Rong, M.D.

## 2013-02-05 NOTE — Progress Notes (Signed)
This patient is a 47 year old female diagnosed with myeloma. She is a patient of Dr. Gaylyn Rong.  Past medical history includes HIV, pneumonia, acute renal insufficiency.  Medications include Phenergan and Decadron.  Labs include BUN of 35 and creatinine 4.1.  Height: 64 inches. Weight: 149 pounds. Usual body weight: 165 pounds. BMI: 25.56.  Patient reports appetite is good but she has difficulty eating snacks between meals. She tries to limit foods that could worsen her kidney disease.  She has lost 16 pounds.  Nutrition diagnosis: Food and nutrition related knowledge deficit related to diagnosis of multiple myeloma and history of acute renal insufficiency as evidenced by no prior need for nutrition related information.  Intervention: Patient was educated on a diet for acute renal insufficiency. I reviewed foods that are high in protein. I encouraged patient not to restrict total calories and to strive for weight maintenance. I have provided samples of oral nutrition supplements for patient to try such as ensure clear. I provided a fact sheet for her to take with her today. Teach back method used.  Monitoring, evaluation, goals: Patient will tolerate adequate calories and protein to minimize weight loss.  Next visit: Patient will contact me if she has questions.

## 2013-02-07 ENCOUNTER — Other Ambulatory Visit (INDEPENDENT_AMBULATORY_CARE_PROVIDER_SITE_OTHER): Payer: No Typology Code available for payment source

## 2013-02-07 DIAGNOSIS — N179 Acute kidney failure, unspecified: Secondary | ICD-10-CM

## 2013-02-07 DIAGNOSIS — E872 Acidosis, unspecified: Secondary | ICD-10-CM

## 2013-02-07 DIAGNOSIS — D649 Anemia, unspecified: Secondary | ICD-10-CM

## 2013-02-07 NOTE — Progress Notes (Signed)
Pt here today with orders from Dr Zetta Bills. Please draw Renal Panel, Hgb, Hct, Urinalysis, Magnesium and Vit D 25.   Order submitted.   Laurell Josephs, RN

## 2013-02-08 ENCOUNTER — Ambulatory Visit (HOSPITAL_BASED_OUTPATIENT_CLINIC_OR_DEPARTMENT_OTHER): Payer: No Typology Code available for payment source

## 2013-02-08 ENCOUNTER — Encounter: Payer: Self-pay | Admitting: Oncology

## 2013-02-08 ENCOUNTER — Other Ambulatory Visit: Payer: Self-pay | Admitting: Oncology

## 2013-02-08 ENCOUNTER — Other Ambulatory Visit (HOSPITAL_BASED_OUTPATIENT_CLINIC_OR_DEPARTMENT_OTHER): Payer: No Typology Code available for payment source

## 2013-02-08 ENCOUNTER — Ambulatory Visit (HOSPITAL_BASED_OUTPATIENT_CLINIC_OR_DEPARTMENT_OTHER): Payer: No Typology Code available for payment source | Admitting: Oncology

## 2013-02-08 VITALS — BP 139/78 | HR 94 | Temp 98.1°F | Resp 16 | Ht 64.0 in | Wt 154.4 lb

## 2013-02-08 DIAGNOSIS — M899 Disorder of bone, unspecified: Secondary | ICD-10-CM

## 2013-02-08 DIAGNOSIS — C9000 Multiple myeloma not having achieved remission: Secondary | ICD-10-CM

## 2013-02-08 DIAGNOSIS — C9002 Multiple myeloma in relapse: Secondary | ICD-10-CM

## 2013-02-08 DIAGNOSIS — D4989 Neoplasm of unspecified behavior of other specified sites: Secondary | ICD-10-CM

## 2013-02-08 DIAGNOSIS — Z5112 Encounter for antineoplastic immunotherapy: Secondary | ICD-10-CM

## 2013-02-08 DIAGNOSIS — N189 Chronic kidney disease, unspecified: Secondary | ICD-10-CM

## 2013-02-08 DIAGNOSIS — B20 Human immunodeficiency virus [HIV] disease: Secondary | ICD-10-CM

## 2013-02-08 LAB — URINALYSIS, ROUTINE W REFLEX MICROSCOPIC
Bilirubin Urine: NEGATIVE
Glucose, UA: NEGATIVE mg/dL
Specific Gravity, Urine: 1.016 (ref 1.005–1.030)
Urobilinogen, UA: 0.2 mg/dL (ref 0.0–1.0)

## 2013-02-08 LAB — CBC WITH DIFFERENTIAL/PLATELET
BASO%: 0.2 % (ref 0.0–2.0)
Basophils Absolute: 0 10*3/uL (ref 0.0–0.1)
Eosinophils Absolute: 0 10*3/uL (ref 0.0–0.5)
HCT: 23.7 % — ABNORMAL LOW (ref 34.8–46.6)
HGB: 8.1 g/dL — ABNORMAL LOW (ref 11.6–15.9)
LYMPH%: 19.5 % (ref 14.0–49.7)
MCHC: 34.2 g/dL (ref 31.5–36.0)
MONO#: 0.1 10*3/uL (ref 0.1–0.9)
NEUT%: 78.8 % — ABNORMAL HIGH (ref 38.4–76.8)
Platelets: 197 10*3/uL (ref 145–400)
WBC: 8.6 10*3/uL (ref 3.9–10.3)
lymph#: 1.7 10*3/uL (ref 0.9–3.3)

## 2013-02-08 LAB — URINALYSIS, MICROSCOPIC ONLY
Bacteria, UA: NONE SEEN
Casts: NONE SEEN
Crystals: NONE SEEN

## 2013-02-08 LAB — RENAL FUNCTION PANEL
BUN: 24 mg/dL — ABNORMAL HIGH (ref 6–23)
CO2: 19 mEq/L (ref 19–32)
Chloride: 107 mEq/L (ref 96–112)
Creat: 3.04 mg/dL — ABNORMAL HIGH (ref 0.50–1.10)
Potassium: 4.4 mEq/L (ref 3.5–5.3)

## 2013-02-08 LAB — BASIC METABOLIC PANEL (CC13)
BUN: 23.6 mg/dL (ref 7.0–26.0)
CO2: 19 mEq/L — ABNORMAL LOW (ref 22–29)
Calcium: 9.1 mg/dL (ref 8.4–10.4)
Chloride: 109 mEq/L — ABNORMAL HIGH (ref 98–107)
Creatinine: 3.2 mg/dL (ref 0.6–1.1)
Glucose: 113 mg/dl — ABNORMAL HIGH (ref 70–99)

## 2013-02-08 LAB — VITAMIN D 25 HYDROXY (VIT D DEFICIENCY, FRACTURES): Vit D, 25-Hydroxy: 29 ng/mL — ABNORMAL LOW (ref 30–89)

## 2013-02-08 LAB — MAGNESIUM: Magnesium: 2.1 mg/dL (ref 1.5–2.5)

## 2013-02-08 MED ORDER — BORTEZOMIB CHEMO SQ INJECTION 3.5 MG (2.5MG/ML)
1.3000 mg/m2 | Freq: Once | INTRAMUSCULAR | Status: AC
  Administered 2013-02-08: 2.25 mg via SUBCUTANEOUS
  Filled 2013-02-08: qty 2.25

## 2013-02-08 MED ORDER — LIDOCAINE-PRILOCAINE 2.5-2.5 % EX CREA: TOPICAL_CREAM | CUTANEOUS | Status: DC | PRN

## 2013-02-08 MED ORDER — ONDANSETRON HCL 8 MG PO TABS
8.0000 mg | ORAL_TABLET | Freq: Once | ORAL | Status: AC
  Administered 2013-02-08: 8 mg via ORAL

## 2013-02-08 MED ORDER — HEPARIN SOD (PORK) LOCK FLUSH 100 UNIT/ML IV SOLN
500.0000 [IU] | Freq: Once | INTRAVENOUS | Status: AC
  Administered 2013-02-08: 500 [IU] via INTRAVENOUS
  Filled 2013-02-08: qty 5

## 2013-02-08 MED ORDER — SODIUM CHLORIDE 0.9 % IJ SOLN
10.0000 mL | INTRAMUSCULAR | Status: DC | PRN
  Administered 2013-02-08: 10 mL via INTRAVENOUS
  Filled 2013-02-08: qty 10

## 2013-02-08 NOTE — Progress Notes (Signed)
[facility]  Telephone:(336) [phone] Fax:(336) [phone]   OFFICE PROGRESS NOTE   Cc:  [name], MD  DIAGNOSIS: IgG kappa multiple myeloma; presented with anemia, acute renal failure. Initial M spike was 5.15 g/dL; IgG 7040 mg/dL; positive Bence Jones protein. Bone marrow biopsy on 01/11/2013 showed 40% plasma cell. Cytogenetics and myeloma FISH panel are pending.   PAST THERAPY: Biopsy only.  CURRENT THERAPY: Velcade and Dexamethasone started on 01/25/13.  INTERVAL HISTORY: Jennifer Boyle 47 y.o. female returns for routine follow-up by herself. She has intermittent arthralgias to her bilateral shoulders. Uses Tylenol PRN with relief. Rib pain is better. She denies chest pain, hemoptysis, gum bleeding, melena, hematochezia, vaginal bleeding, hematuria. She denies severe low back pain, bowel bladder incontinence, or paresthesia.   Patient denies fever, anorexia, weight loss, fatigue, headache, visual changes, confusion, drenching night sweats, palpable lymph node swelling, mucositis, odynophagia, dysphagia, nausea vomiting, jaundice, chest pain, palpitation, productive cough, gum bleeding, epistaxis, hematemesis, hemoptysis, abdominal pain, abdominal swelling, early satiety, melena, hematochezia, hematuria, skin rash, spontaneous bleeding, joint swelling, joint pain, heat or cold intolerance, bowel bladder incontinence, back pain, focal motor weakness, paresthesia, depression.    Past Medical History  Diagnosis Date  . Pneumonia 10/2002; 2010    Required INTUBATION for streptococcal pneumonia/septicemia (10/2002)  . HIV disease DX: 01/2002  . Fibroid uterus   . 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 History of cryptococcosis  01/2002    fungemia  . Peripheral neuropathy     resolved.     Past Surgical History  Procedure Laterality Date  . No past surgeries      Current Outpatient Prescriptions  Medication Sig Dispense Refill  . abacavir (ZIAGEN) 300 MG tablet Take 2 tablets (600 mg total) by mouth daily.  60 tablet  11  . acyclovir (ZOVIRAX) 400 MG tablet Take 0.5 tablets (200 mg total) by mouth daily.  60 tablet  3  . dexamethasone (DECADRON) 4 MG tablet Take 10 tablets (40 mg total) by mouth once a week.  200 tablet  2  . Dolutegravir Sodium 50 MG TABS Take 50 mg by mouth 2 (two) times daily.       [name] lamivudine (EPIVIR) 100 MG tablet Take 1 tablet (100 mg total) by mouth daily.  30 tablet  11  . lidocaine-prilocaine (EMLA) cream Apply topically as needed. Apply to portacath one hour before chemo access or blood draw.  30 g  3  . prochlorperazine (COMPAZINE) 10 MG tablet Take 1 tablet (10 mg total) by mouth every 6 (six) hours as needed (Nausea or vomiting).  30 tablet  1  . promethazine (PHENERGAN) 25 MG tablet Take 1 tablet (25 mg total) by mouth every 6 (six) hours as needed for nausea.  30 tablet  0  . sodium bicarbonate 650 MG tablet Take 1 tablet (650 mg total) by mouth 3 (three) times daily.  90 tablet  1   No current facility-administered medications for this visit.    ALLERGIES:  is allergic to aspirin and efavirenz.  REVIEW OF SYSTEMS:  The rest of the 14-point review of system was negative.   Filed Vitals:   02/08/13 1321  BP: 139/78  Pulse: 94  Temp: 98.1 F (36.7 C)  Resp:  16   Wt Readings from Last 3 Encounters:  02/08/13 154 lb 6.4 oz (70.035 kg)  02/04/13 149 lb (67.586 kg)  02/01/13 154 lb 4.8 oz (69.99 kg)   ECOG Performance status: 0-1  PHYSICAL EXAMINATION:   General:  well-nourished in no acute distress.  Eyes:  no scleral icterus.  ENT:  There were no oropharyngeal lesions.  Neck was without thyromegaly.  Lymphatics:  Negative cervical, supraclavicular or axillary  adenopathy.  Respiratory: lungs were clear bilaterally without wheezing or crackles.  Cardiovascular:  Regular rate and rhythm, S1/S2, without murmur, rub or gallop.  There was no pedal edema.  GI:  abdomen was soft, flat, nontender, nondistended, without organomegaly.  Muscoloskeletal:  no spinal tenderness of palpation of vertebral spine.  Skin exam was without echymosis, petichae.  Neuro exam was nonfocal.  Patient was able to get on and off exam table without assistance.  Gait was normal.  Patient was alerted and oriented.  Attention was good.   Language was appropriate.  Mood was normal without depression.  Speech was not pressured.  Thought content was not tangential.     LABORATORY/RADIOLOGY DATA:  Lab Results  Component Value Date   WBC 8.6 02/08/2013   HGB 8.1* 02/08/2013   HCT 23.7* 02/08/2013   PLT 197 02/08/2013   GLUCOSE 113* 02/08/2013   CHOL 174 07/31/2012   TRIG 78 07/31/2012   HDL 40 07/31/2012   LDLCALC 118* 07/31/2012   ALKPHOS 129* 12/20/2012   ALT 20 12/20/2012   AST 26 12/20/2012   NA 137 02/08/2013   K 5.1 02/08/2013   CL 109* 02/08/2013   CREATININE 3.2 Repeated and Verified* 02/08/2013   BUN 23.6 02/08/2013   CO2 19* 02/08/2013   INR 1.13 02/04/2013   MICROALBUR 48.81* 12/20/2012    ASSESSMENT AND PLAN:   1. Multiple myeloma:  - She is on Velcade/Dexamethasone. She is doing well without major complication. I recommended to proceed with cycl#1, day #15 today without dose modification. At the end of this cycle of chemo, if her renal function is stable (albeited compromised), may consider adding on Revlimid. If her renal function fluctuate, I may consider Cytoxan instead of Revlimid.  To prevent risk of herpes reactivation, I advise her to continue Acyclovir.  2. Bone lesions: Due to myeloma. She does not want pain med. Pain is improving with Tylenol. I recommended that she sees her dentist before receiving Zometa.  3. HIV/AIDS: On abacavir, Dolutegravir, Lamuvidine per ID.  4. 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 Cr is improving 5. Follow up: In about 2 weeks for cycle#2, day #1 of chemo.    The length of time of the face-to-face encounter was 15 minutes. More than 50% of time was spent counseling and coordination of care.

## 2013-02-08 NOTE — Progress Notes (Signed)
Left message on voicemail with patient stating that her Rx for EMLA cream can be picked up @ [facility].

## 2013-02-08 NOTE — Progress Notes (Signed)
Patient left message and I called her back. She said hospital sent a letter of 100% indigent?? I advised I have not idea she would have to call them. See previous notes

## 2013-02-08 NOTE — Patient Instructions (Addendum)
[facility] Discharge Instructions for Patients Receiving Chemotherapy  Today you received the following chemotherapy agents Velcade.  To help prevent nausea and vomiting after your treatment, we encourage you to take your nausea medication as prescribed.    If you develop nausea and vomiting that is not controlled by your nausea medication, call the clinic. If it is after clinic hours your family physician or the after hours number for the clinic or go to the Emergency Department.   BELOW ARE SYMPTOMS THAT SHOULD BE REPORTED IMMEDIATELY:  *FEVER GREATER THAN 100.5 F  *CHILLS WITH OR WITHOUT FEVER  NAUSEA AND VOMITING THAT IS NOT CONTROLLED WITH YOUR NAUSEA MEDICATION  *UNUSUAL SHORTNESS OF BREATH  *UNUSUAL BRUISING OR BLEEDING  TENDERNESS IN MOUTH AND THROAT WITH OR WITHOUT PRESENCE OF ULCERS  *URINARY PROBLEMS  *BOWEL PROBLEMS  UNUSUAL RASH Items with * indicate a potential emergency and should be followed up as soon as possible.  Please let the nurse know about any problems that you may have experienced. Feel free to call the clinic you have any questions or concerns. The clinic phone number is [phone].   I have been informed and understand all the instructions given to me. I know to contact the clinic, my physician, or go to the Emergency Department if any problems should occur. I do not have any questions at this time, but understand that I may call the clinic during office hours   should I have any questions or need assistance in obtaining follow up care.    __________________________________________  _____________  __________ Signature of Patient or Authorized Representative            Date                   Time    __________________________________________ Nurse's Signature    

## 2013-02-09 ENCOUNTER — Encounter: Payer: Self-pay | Admitting: Infectious Disease

## 2013-02-12 ENCOUNTER — Ambulatory Visit: Payer: PRIVATE HEALTH INSURANCE

## 2013-02-12 ENCOUNTER — Other Ambulatory Visit (HOSPITAL_BASED_OUTPATIENT_CLINIC_OR_DEPARTMENT_OTHER): Payer: PRIVATE HEALTH INSURANCE

## 2013-02-12 VITALS — BP 153/83 | HR 77 | Temp 98.3°F | Resp 18

## 2013-02-12 DIAGNOSIS — C9000 Multiple myeloma not having achieved remission: Secondary | ICD-10-CM

## 2013-02-12 DIAGNOSIS — D4989 Neoplasm of unspecified behavior of other specified sites: Secondary | ICD-10-CM

## 2013-02-12 LAB — COMPREHENSIVE METABOLIC PANEL (CC13)
ALT: 14 U/L (ref 0–55)
AST: 19 U/L (ref 5–34)
CO2: 22 mEq/L (ref 22–29)
Calcium: 8.9 mg/dL (ref 8.4–10.4)
Chloride: 110 mEq/L — ABNORMAL HIGH (ref 98–107)
Potassium: 4.4 mEq/L (ref 3.5–5.1)
Sodium: 138 mEq/L (ref 136–145)
Total Protein: 9.5 g/dL — ABNORMAL HIGH (ref 6.4–8.3)

## 2013-02-12 LAB — CBC WITH DIFFERENTIAL/PLATELET
Basophils Absolute: 0 10*3/uL (ref 0.0–0.1)
Eosinophils Absolute: 0.1 10*3/uL (ref 0.0–0.5)
HGB: 7.9 g/dL — ABNORMAL LOW (ref 11.6–15.9)
MCV: 98 fL (ref 79.5–101.0)
MONO#: 0.6 10*3/uL (ref 0.1–0.9)
MONO%: 8.3 % (ref 0.0–14.0)
NEUT#: 4 10*3/uL (ref 1.5–6.5)
RDW: 16.7 % — ABNORMAL HIGH (ref 11.2–14.5)
WBC: 6.7 10*3/uL (ref 3.9–10.3)

## 2013-02-12 MED ORDER — SODIUM CHLORIDE 0.9 % IJ SOLN
10.0000 mL | INTRAMUSCULAR | Status: DC | PRN
  Administered 2013-02-12: 10 mL via INTRAVENOUS
  Filled 2013-02-12: qty 10

## 2013-02-12 MED ORDER — HEPARIN SOD (PORK) LOCK FLUSH 100 UNIT/ML IV SOLN
500.0000 [IU] | Freq: Once | INTRAVENOUS | Status: AC
  Administered 2013-02-12: 500 [IU] via INTRAVENOUS
  Filled 2013-02-12: qty 5

## 2013-02-14 LAB — PROTEIN ELECTROPHORESIS, SERUM
Albumin ELP: 34.7 % — ABNORMAL LOW (ref 55.8–66.1)
Alpha-1-Globulin: 4 % (ref 2.9–4.9)
Alpha-2-Globulin: 11.5 % (ref 7.1–11.8)
Beta 2: 4.2 % (ref 3.2–6.5)
Beta Globulin: 4.6 % — ABNORMAL LOW (ref 4.7–7.2)
Total Protein, Serum Electrophoresis: 9.3 g/dL — ABNORMAL HIGH (ref 6.0–8.3)

## 2013-02-14 LAB — KAPPA/LAMBDA LIGHT CHAINS: Lambda Free Lght Chn: 0.15 mg/dL — ABNORMAL LOW (ref 0.57–2.63)

## 2013-02-15 DIAGNOSIS — B20 Human immunodeficiency virus [HIV] disease: Secondary | ICD-10-CM

## 2013-02-15 NOTE — Progress Notes (Signed)
Call from Dr. Eliane Decree office for lab draw at patient's appointment on 02/18/13 for CBC, CMET, Phosphorus, Magnesium, and results to be faxed to his office.

## 2013-02-18 ENCOUNTER — Encounter: Payer: Self-pay | Admitting: *Deleted

## 2013-02-18 ENCOUNTER — Encounter: Payer: Self-pay | Admitting: Infectious Disease

## 2013-02-18 ENCOUNTER — Telehealth: Payer: Self-pay | Admitting: Oncology

## 2013-02-18 ENCOUNTER — Ambulatory Visit: Payer: PRIVATE HEALTH INSURANCE | Admitting: Lab

## 2013-02-18 ENCOUNTER — Ambulatory Visit (HOSPITAL_BASED_OUTPATIENT_CLINIC_OR_DEPARTMENT_OTHER): Payer: PRIVATE HEALTH INSURANCE | Admitting: Oncology

## 2013-02-18 VITALS — BP 145/83 | HR 77 | Temp 97.9°F | Resp 18 | Ht 64.0 in | Wt 153.1 lb

## 2013-02-18 DIAGNOSIS — B20 Human immunodeficiency virus [HIV] disease: Secondary | ICD-10-CM

## 2013-02-18 DIAGNOSIS — D4989 Neoplasm of unspecified behavior of other specified sites: Secondary | ICD-10-CM

## 2013-02-18 DIAGNOSIS — C9000 Multiple myeloma not having achieved remission: Secondary | ICD-10-CM

## 2013-02-18 DIAGNOSIS — D649 Anemia, unspecified: Secondary | ICD-10-CM

## 2013-02-18 DIAGNOSIS — N189 Chronic kidney disease, unspecified: Secondary | ICD-10-CM

## 2013-02-18 MED ORDER — LENALIDOMIDE 15 MG PO CAPS: 15.0000 mg | ORAL_CAPSULE | ORAL | Status: DC

## 2013-02-18 MED ORDER — WARFARIN SODIUM 5 MG PO TABS: 5.0000 mg | ORAL_TABLET | Freq: Every day | ORAL | Status: DC

## 2013-02-18 NOTE — Patient Instructions (Addendum)
1.  Myeloma - very good response to one cycle of Velcade/Dexamethasone - I recommend adding on Revlimid with the 2nd cycle of chemo with to achieve better response and faster.  - Revlimid will be renally dosed at 15mg  by mouth every 48 hours on days 1 through day 20 of each cycle cycle; and then 8 days off to coincide with Velcade/Dexamethasone. - Potential side effects of Revlimid include but not limited to fatigue, mouth sore, bone ache, blood clot, low blood count, infection. - Recommend adding on Coumadin 5mg  by mouth every night to decrease risk of blood clot while on Revlimid.  2.  Bone lesions: - Need dental clearance before starting Zometa to decrease risk of osteonecrosis of the jaw.  - Zometa was proven in patients with myeloma and bone lesions to decrease risk of bone pain and fractures.   3.  Long term plan:  - Referral to Bone Marrow Transplant program [place], [place], or [place]) to see if transplant is recommended or not.

## 2013-02-18 NOTE — Progress Notes (Signed)
Pt enrolled in Revlimid [facility] program.

## 2013-02-19 ENCOUNTER — Telehealth: Payer: Self-pay | Admitting: Oncology

## 2013-02-19 ENCOUNTER — Encounter: Payer: Self-pay | Admitting: Oncology

## 2013-02-19 NOTE — Telephone Encounter (Signed)
Could not schedule appt for pt at [facility] due to no insurance.  Left pt vm.

## 2013-02-19 NOTE — Progress Notes (Signed)
[facility]  [phone] Fax:([phone]     HEMATOLOGY - OFFICE PROGRESS NOTE    DIAGNOSIS: IgG kappa multiple myeloma; presented with anemia, acute renal failure. Initial M spike was 5.15 g/dL; IgG 7040 mg/dL; positive Bence Jones protein. Bone marrow biopsy on 01/11/2013  showed 40% plasma cell. Cytogenetics and myeloma FISH panel are pending.   CURRENT THERAPY:  Velcade/Dex started in April 2014 with plan to add on Revlimid at a later date when her renal function is stable.   INTERVAL HISTORY: Ms. Jennifer Boyle returned to clinic for follow up.  She has mild fatigue from chemo.  However, she still lives by herself and is independent of all activities of daily living.  Her lower back/sacral pain at presentation is much better from chemo. Her bilateral lower rib pain has been sporadic and was not present today. She developed moderate, crampy joint pain in the shoulders, hips, knees. She takes Tylenol which resolves her pain.  She does not need or want stronger pain meds.   She denies fever, anorexia, weight loss, fatigue, headache, visual changes, confusion, drenching night sweats, palpable lymph node swelling, mucositis, odynophagia, dysphagia, nausea vomiting, jaundice, chest pain, palpitation, shortness of breath, dyspnea on exertion, productive cough, gum bleeding, epistaxis, hematemesis, hemoptysis, abdominal pain, abdominal swelling, early satiety, melena, hematochezia, hematuria, skin rash, spontaneous bleeding, heat or cold intolerance, bowel bladder incontinence, back pain, focal motor weakness, paresthesia, depression.     Past Medical History  Diagnosis Date  . Pneumonia 10/2002; 2010    Required INTUBATION for streptococcal pneumonia/septicemia (10/2002)  . HIV disease DX: 01/2002  . Fibroid uterus   . History of abnormal Pap smear DX: 04/04-05/05    PAP showing LGSIL from 04/04-05/05 (biopsy showing CIN-II with moderate dysplasia) with  subsequent multiple negative yearly PAP smears  . 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 Peripheral neuropathy     resolved.   :    Past Surgical History  Procedure Laterality Date  . No past surgeries    :   CURRENT MEDS: Current Outpatient Prescriptions  Medication Sig Dispense Refill  . abacavir (ZIAGEN) 300 MG tablet Take 2 tablets (600 mg total) by mouth daily.  60 tablet  11  . acyclovir (ZOVIRAX) 400 MG tablet Take 0.5 tablets (200 mg total) by mouth daily.  60 tablet  3  . dexamethasone (DECADRON) 4 MG tablet Take 10 tablets (40 mg total) by mouth once a week.  200 tablet  2  . Dolutegravir Sodium 50 MG TABS Take 50 mg by mouth 2 (two) times daily.       [name] lamivudine (EPIVIR) 100 MG tablet Take 1 tablet (100 mg total) by mouth daily.  30 tablet  11  . lidocaine-prilocaine (EMLA) cream Apply topically as needed. Apply to portacath one hour before chemo access or blood draw.  30 g  3  . prochlorperazine (COMPAZINE) 10 MG tablet Take 1 tablet (10 mg total) by mouth every 6 (six) hours as needed (Nausea or vomiting).  30 tablet  1  . promethazine (PHENERGAN) 25 MG tablet Take 1 tablet (25 mg total) by mouth every 6 (six) hours as needed for nausea.  30 tablet  0  . sodium bicarbonate 650 MG tablet Take 1 tablet (650 mg total) by mouth 3 (three) times daily.  90 tablet  1  .  lenalidomide (REVLIMID) 15 MG capsule Take 1 capsule (15 mg total) by mouth every other day.  10 capsule  0  . warfarin (COUMADIN) 5 MG tablet Take 1 tablet (5 mg total) by mouth daily.  30 tablet  3   No current facility-administered medications for this visit.      Allergies  Allergen Reactions  . Aspirin     Upset stomach   . Efavirenz Hives and Rash    (brand name sustiva)  :  No family history on file.:  History   Social History  . Marital Status: Single    Spouse Name: N/A    Number of  Children: 0  . Years of Education: N/A   Occupational History  .      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    Social History Main Topics  . Smoking status: Never Smoker   . Smokeless tobacco: Never Used  . Alcohol Use: No  . Drug Use: No  . Sexually Active: Not Currently   Other Topics Concern  . Not on file   Social History Narrative  . No narrative on file  :  REVIEW OF SYSTEM:  The rest of the 14-point review of sytem was negative.   Exam: ECOG 0-1  General:  well-nourished  woman,in no acute distress.  Eyes:  no scleral icterus.  ENT:  There were no oropharyngeal lesions.  Neck was without thyromegaly.  Lymphatics:  Negative cervical, supraclavicular or axillary adenopathy.  Respiratory: lungs were clear bilaterally without wheezing or crackles.  Cardiovascular:  Regular rate and rhythm, S1/S2, without murmur, rub or gallop.  There was no pedal edema.  GI:  abdomen was soft, flat, nontender, nondistended, without organomegaly.  Muscoloskeletal:  no spinal tenderness of palpation of vertebral spine.  Skin exam was without echymosis, petichae.  Neuro exam was nonfocal.  Patient was able to get on and off exam table without assistance.  Gait was normal.  Patient was alerted and oriented.  Attention was good.   Language was appropriate.  Mood was normal without depression.  Speech was not pressured.  Thought content was not tangential.    LABS:  Lab Results  Component Value Date   WBC 6.7 02/12/2013   HGB 7.9* 02/12/2013   HCT 23.3* 02/12/2013   PLT 201 02/12/2013   GLUCOSE 96 02/12/2013   CHOL 174 07/31/2012   TRIG 78 07/31/2012   HDL 40 07/31/2012   LDLCALC 118* 07/31/2012   ALT 14 02/12/2013   AST 19 02/12/2013   NA 138 02/12/2013   K 4.4 02/12/2013   CL 110* 02/12/2013   CREATININE 3.1 Repeated and Verified* 02/12/2013   BUN 29.5* 02/12/2013   CO2 22 02/12/2013   INR 1.13 02/04/2013   MICROALBUR 48.81* 12/20/2012    ASSESSMENT AND PLAN:    1.  Multiple myeloma:  - very good response  to one cycle of Velcade/Dexamethasone - I recommend adding on Revlimid with the 2nd cycle of chemo with to achieve better response and faster.  - Revlimid will be renally dosed at 15mg  by mouth every 48 hours on days 1 through day 20 of each cycle cycle; and then 8 days off to coincide with Velcade/Dexamethasone. - Potential side effects of Revlimid include but not limited to fatigue, mouth sore, bone ache, blood clot, low blood count, infection. - Recommend adding on Coumadin 5mg  by mouth every night to decrease risk of blood clot while on Revlimid. - To prevent risk of herpes reactivation, I  advise her to continue Acyclovir.   She expressed informed understanding and wished to proceed with the addition of Revlimid to the current regimen of Velcade/Dexamethasone.    2.  Bone lesions from myeloma: - Need dental clearance before starting Zometa to decrease risk of osteonecrosis of the jaw.  - Zometa was proven in patients with myeloma and bone lesions to decrease risk of bone pain and fractures.   3.  Long term plan:  - Referral to Bone Marrow Transplant program [facility], [facility], or [facility]) to see if transplant is recommended or not when she has insurance.  She does not even have medicaid right now.  She is in the process of applying for it.  4.  HIV/AIDS:  On abacavir, Dolutegravir, Lamuvidine per ID.   5.  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  Cr is stable today.   6.  Anemia:  Due to myeloma, renal insufficiency, chemo treatment.  She has no active bleeding nor severe symptoms of anemia.  We will continue to check weekly CBC and consider transfusion when Hgb <7.   7.  Follow up:  Weekly lab and chemo.  She will see Belenda Cruise in about 2-3 weeks for mid cycle check. We will see her in 1 month before the 3rd cycle of chemo.     The length of time of the face-to-face encounter was 45 minutes. More than 50% of time was spent counseling and coordination of care.     Huan T. Gaylyn Rong, M.D.

## 2013-02-20 ENCOUNTER — Encounter: Payer: Self-pay | Admitting: *Deleted

## 2013-02-20 ENCOUNTER — Encounter: Payer: Self-pay | Admitting: Oncology

## 2013-02-20 NOTE — Progress Notes (Signed)
REMS Auth # [id number] for Revlimid.   Rx given to [facility] in [facility].  She states pt approved for 6 months free Revlimid.

## 2013-02-20 NOTE — Progress Notes (Signed)
[employer] approved patient to receive free revlimid from 02/19/13-08/22/13 faxed prescription to [phone].

## 2013-02-21 ENCOUNTER — Encounter (HOSPITAL_COMMUNITY): Payer: Self-pay | Admitting: Dentistry

## 2013-02-21 ENCOUNTER — Ambulatory Visit (HOSPITAL_COMMUNITY): Payer: Self-pay | Admitting: Dentistry

## 2013-02-21 VITALS — BP 174/90 | HR 87 | Temp 98.3°F

## 2013-02-21 DIAGNOSIS — M264 Malocclusion, unspecified: Secondary | ICD-10-CM

## 2013-02-21 DIAGNOSIS — K036 Deposits [accretions] on teeth: Secondary | ICD-10-CM

## 2013-02-21 DIAGNOSIS — K0530 Chronic periodontitis, unspecified: Secondary | ICD-10-CM

## 2013-02-21 DIAGNOSIS — C9000 Multiple myeloma not having achieved remission: Secondary | ICD-10-CM

## 2013-02-21 DIAGNOSIS — D165 Benign neoplasm of lower jaw bone: Secondary | ICD-10-CM

## 2013-02-21 DIAGNOSIS — Z0189 Encounter for other specified special examinations: Secondary | ICD-10-CM

## 2013-02-21 NOTE — Progress Notes (Signed)
DENTAL CONSULTATION  Date of Consultation:  02/21/2013 Patient Name:   Jennifer Boyle Date of Birth:   28-Sep-1966 Medical Record Number: [id number]  VITALS: BP 174/90  Pulse 87  Temp(Src) 98.3 F (36.8 C) (Oral)  LMP 01/04/2013   CHIEF COMPLAINT: Patient referred by Dr.Ha for a pre-Zometa dental protocol examination.  HPI: Jennifer Boyle is a 47 year old female recently diagnosed with multiple myeloma. Patient currently undergoing active chemotherapy. Patient with anticipated use of Zometa therapy. Patient is now seen as part of a pre-Zometa/antiresorptive therapy dental consultation protocol evaluation.  Patient currently denies acute toothache, swellings, or abscesses. Patient was last seen by her primary dentist in October of 2013. Patient is seen for routine dental care every 6 months by her report. Patient has been seeing Dr. Ramiro Harvest over the past 12 years.  Patient denies having any unmet dental needs. Patient had her previous crown or bridge therapy provided by Dr. Ramiro Harvest.   PMH: Past Medical History  Diagnosis Date  . Pneumonia 10/2002; 2010    Required INTUBATION for streptococcal pneumonia/septicemia (10/2002)  . HIV disease DX: 01/2002  . Fibroid uterus   . 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 Peripheral neuropathy     resolved.   . Multiple myeloma 01/2013    01/11/2013 cytogenetics showed 59, XX, +2, +4, +5, +7, add(7)(p22), +, +9, +11, der(11) t(1;11)(q11; q24), +12, +15, -16, +17, +19, +20, +mar1, +mar2[cp6] / 46, XX [17].   Myeloma FISH showed presence of +11, and +17.     PSH: Past Surgical History  Procedure Laterality Date  . No past surgeries       ALLERGIES: Allergies  Allergen Reactions  . Aspirin     Upset stomach   . Efavirenz Hives and Rash    (brand name sustiva)    MEDICATIONS: Current Outpatient Prescriptions  Medication Sig Dispense Refill  . abacavir (ZIAGEN) 300 MG tablet Take 2 tablets (600 mg total) by mouth daily.  60 tablet  11  . acyclovir (ZOVIRAX) 400 MG tablet Take 0.5 tablets (200 mg total) by mouth daily.  60 tablet  3  . dexamethasone (DECADRON) 4 MG tablet Take 10 tablets (40 mg total) by mouth once a week.  200 tablet  2  . Dolutegravir Sodium 50 MG TABS Take 50 mg by mouth 2 (two) times daily.       [name] lamivudine (EPIVIR) 100 MG tablet Take 1 tablet (100 mg total) by mouth daily.  30 tablet  11  . lenalidomide (REVLIMID) 15 MG capsule Take 1 capsule (15 mg total) by mouth every other day.  10 capsule  0  . lidocaine-prilocaine (EMLA) cream Apply topically as needed. Apply to portacath one hour before chemo access or blood draw.  30 g  3  . prochlorperazine (COMPAZINE) 10 MG tablet Take 1 tablet (10 mg total) by mouth every 6 (six) hours as needed (Nausea or vomiting).  30 tablet  1  . sodium bicarbonate 650 MG tablet Take 1 tablet (650 mg total) by mouth 3 (three) times daily.  90 tablet  1  . promethazine (PHENERGAN) 25 MG tablet Take 1 tablet (25 mg total) by mouth every 6 (six) hours as needed for  nausea.  30 tablet  0  . warfarin (COUMADIN) 5 MG tablet Take 1 tablet (5 mg total) by mouth daily.  30 tablet  3   No current facility-administered medications for this visit.    LABS: Lab Results  Component Value Date   WBC 6.7 02/12/2013   HGB 7.9* 02/12/2013   HCT 23.3* 02/12/2013   MCV 98.0 02/12/2013   PLT 201 02/12/2013      Component Value Date/Time   NA 138 02/12/2013 1117   NA 138 02/07/2013 1226   K 4.4 02/12/2013 1117   K 4.4 02/07/2013 1226   CL 110* 02/12/2013 1117   CL 107 02/07/2013 1226   CO2 22 02/12/2013 1117   CO2 19 02/07/2013 1226   GLUCOSE 96 02/12/2013 1117   GLUCOSE 61* 02/07/2013 1226   BUN  29.5* 02/12/2013 1117   BUN 24* 02/07/2013 1226   CREATININE 3.1 Repeated and Verified* 02/12/2013 1117   CREATININE 3.04* 02/07/2013 1226   CREATININE 4.01* 02/04/2013 0850   CALCIUM 8.9 02/12/2013 1117   CALCIUM 9.2 02/07/2013 1226   GFRNONAA 12* 02/04/2013 0850   GFRAA 14* 02/04/2013 0850   Lab Results  Component Value Date   INR 1.13 02/04/2013   No results found for this basename: PTT    SOCIAL HISTORY: History   Social History  . Marital Status: Single    Spouse Name: N/A    Number of Children: 0  . Years of Education: N/A   Occupational History  .      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    Social History Main Topics  . Smoking status: Never Smoker   . Smokeless tobacco: Never Used  . Alcohol Use: No  . Drug Use: No  . Sexually Active: Not Currently   Other Topics Concern  . Not on file   Social History Narrative   The patient is single. The patient has no children.   The patient is originally from [place] then moved to the [place] approximately 25 years ago.   The patient works as a Tour manager Banker).   Patient has never smoked. The patient does not use alcohol.    The patient has never used smokeless tobacco. The patient denies IV drug abuse.    FAMILY HISTORY: Family History  Problem Relation Age of Onset  . Hypertension Mother      REVIEW OF SYSTEMS: Reviewed with patient and included in dental record.  DENTAL HISTORY: CHIEF COMPLAINT: Patient referred by Dr.Ha for a pre-Zometa dental protocol examination.  HPI: Jennifer Boyle is a 47 year old female recently diagnosed with multiple myeloma. Patient currently undergoing active chemotherapy. Patient with anticipated use of Zometa therapy. Patient is now seen as part of a pre-Zometa/antiresorptive therapy dental consultation protocol evaluation.  Patient currently denies acute toothache, swellings, or abscesses. Patient was last seen by her primary dentist in October of 2013. Patient is seen for  routine dental care every 6 months by her report. Patient has been seeing Dr. Ramiro Harvest over the past 12 years.  Patient denies having any unmet dental needs. Patient had her previous crown or bridge therapy provided by Dr. Ramiro Harvest.  DENTAL EXAMINATION:  GENERAL: The patient is a well-developed, well-nourished female in no acute distress. HEAD AND NECK: There is no palpable submandibular lymphadenopathy. Patient denies acute TMJ symptoms but has left TMJ crepitus. INTRAORAL EXAM: The patient has normal saliva. There is no evidence of abscess formation. The patient has a small mandibular left lingual  torus in the area of tooth #21 DENTITION: The patient is missing tooth numbers 1, 9, 10, 16, 17, and 32. The maxillary anterior teeth are replaced with a bridge. PERIODONTAL: The patient has chronic periodontitis with minimal plaque accumulations. There is incipient bone loss noted. Periodontal charting was deferred secondary to possible bleeding risk with active chemotherapy. Patient has some mandibular anterior tooth mobility. This may be from occlusal trauma. DENTAL CARIES/SUBOPTIMAL RESTORATIONS: There are no obvious dental caries noted. ENDODONTIC: The patient denies acute pulpitis symptoms. There multiple areas of combined periapical radiolucency and radiopacities. 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 CROWN AND BRIDGE: Patient has multiple crown and bridge units. The margins on the crown of tooth #6 will need to be followed and crown replacement if indicated. PROSTHODONTIC: There are no partial dentures OCCLUSION: Patient has a poor occlusal scheme secondary to end to end occlusion and possible occlusal trauma involving the mandibular anterior teeth numbers 23 through 26. Patient was encouraged to followup with primary dentist for fabrication of occlusal splint.  RADIOGRAPHIC INTERPRETATION: An orthopantogram was obtained and  supplemented with a full series of dental radiographs. There are multiple missing teeth. There is incipient bone loss. There is combined radiolucencies and radiopacities associated with the mandibular posterior and anterior teeth. There are multiple restorations noted.   ASSESSMENTS: 1. Chronic periodontitis with incipient bone loss 2. Plaque accumulations-minimal 3. Missing teeth--with maxillary anterior teeth replaced with a bridge. 4. Small mandibular left lingual torus 5. Incipient tooth mobility of the mandibular anterior teeth possibly related to occlusal trauma 6. Combined radiolucent and radiopaque periapical lesions associated with the mandibular posterior and anterior teeth. Electric pulp testing was normal today. 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 7. Need to monitor the margins of the crown on tooth number 6 for replacement of crown in the future    PLAN/RECOMMENDATIONS: 1. I discussed the risks, benefits, and complications of various treatment options with the patient in relationship to the medical and dental conditions, anticipated Zometa therapy, and the risk for osteonecrosis of the jaw with future invasive dental procedures. We discussed various treatment options to include no treatment, periodontal therapy, dental restorations, root canal therapy, crown and bridge therapy, implant therapy, and replacement of missing teeth as indicated. We also discussed fabrication of an as a splint by her primary dentist. The patient currently wishes to defer all dental treatment at this time. Patient will followup with her primary dentist for an exam, cleaning, and evaluation for fabrication of occlusal splint therapy as indicated once medically stable.   2. Discussion of findings with medical team and coordination of future medical and dental care as indicated.  Charlynne Pander, DDS

## 2013-02-21 NOTE — Patient Instructions (Signed)
Patient is cleared for Zometa therapy. Patient followup with a primary dentist of her choice for an exam and cleaning once medically stable. Patient to followup with primary dentist for evaluation for occlusal splint therapy as indicated. Patient to avoid dental extractions or periodontal surgery after Zometa therapy has been provided.  Dr. Kristin Bruins

## 2013-02-22 ENCOUNTER — Other Ambulatory Visit: Payer: Self-pay | Admitting: Oncology

## 2013-02-22 ENCOUNTER — Other Ambulatory Visit (HOSPITAL_BASED_OUTPATIENT_CLINIC_OR_DEPARTMENT_OTHER): Payer: No Typology Code available for payment source | Admitting: Lab

## 2013-02-22 ENCOUNTER — Ambulatory Visit (HOSPITAL_BASED_OUTPATIENT_CLINIC_OR_DEPARTMENT_OTHER): Payer: No Typology Code available for payment source

## 2013-02-22 VITALS — BP 134/66 | HR 85 | Temp 97.9°F

## 2013-02-22 DIAGNOSIS — D4989 Neoplasm of unspecified behavior of other specified sites: Secondary | ICD-10-CM

## 2013-02-22 DIAGNOSIS — C9000 Multiple myeloma not having achieved remission: Secondary | ICD-10-CM

## 2013-02-22 DIAGNOSIS — N289 Disorder of kidney and ureter, unspecified: Secondary | ICD-10-CM

## 2013-02-22 DIAGNOSIS — Z5112 Encounter for antineoplastic immunotherapy: Secondary | ICD-10-CM

## 2013-02-22 LAB — CBC WITH DIFFERENTIAL/PLATELET
Basophils Absolute: 0 10*3/uL (ref 0.0–0.1)
Eosinophils Absolute: 0.1 10*3/uL (ref 0.0–0.5)
HCT: 23.2 % — ABNORMAL LOW (ref 34.8–46.6)
HGB: 7.9 g/dL — ABNORMAL LOW (ref 11.6–15.9)
MCH: 33.9 pg (ref 25.1–34.0)
MCV: 99.9 fL (ref 79.5–101.0)
MONO%: 3 % (ref 0.0–14.0)
NEUT#: 4.9 10*3/uL (ref 1.5–6.5)
NEUT%: 71.1 % (ref 38.4–76.8)
RDW: 18.1 % — ABNORMAL HIGH (ref 11.2–14.5)

## 2013-02-22 LAB — BASIC METABOLIC PANEL (CC13)
BUN: 24.9 mg/dL (ref 7.0–26.0)
CO2: 18 mEq/L — ABNORMAL LOW (ref 22–29)
Calcium: 8.7 mg/dL (ref 8.4–10.4)
Chloride: 111 mEq/L — ABNORMAL HIGH (ref 98–107)
Creatinine: 2.3 mg/dL — ABNORMAL HIGH (ref 0.6–1.1)
Glucose: 112 mg/dl — ABNORMAL HIGH (ref 70–99)
Potassium: 4.7 mEq/L (ref 3.5–5.1)
Sodium: 136 mEq/L (ref 136–145)

## 2013-02-22 LAB — MAGNESIUM (CC13): Magnesium: 1.9 mg/dl (ref 1.5–2.5)

## 2013-02-22 LAB — PROTHROMBIN TIME
INR: 1.02 (ref <1.50)
Prothrombin Time: 13.3 seconds (ref 11.6–15.2)

## 2013-02-22 MED ORDER — SODIUM CHLORIDE 0.9 % IJ SOLN
10.0000 mL | INTRAMUSCULAR | Status: DC | PRN
  Administered 2013-02-22: 10 mL via INTRAVENOUS
  Filled 2013-02-22: qty 10

## 2013-02-22 MED ORDER — BORTEZOMIB CHEMO SQ INJECTION 3.5 MG (2.5MG/ML)
1.3000 mg/m2 | Freq: Once | INTRAMUSCULAR | Status: AC
  Administered 2013-02-22: 2.25 mg via SUBCUTANEOUS
  Filled 2013-02-22: qty 2.25

## 2013-02-22 MED ORDER — HEPARIN SOD (PORK) LOCK FLUSH 100 UNIT/ML IV SOLN
500.0000 [IU] | Freq: Once | INTRAVENOUS | Status: AC
  Administered 2013-02-22: 500 [IU] via INTRAVENOUS
  Filled 2013-02-22: qty 5

## 2013-02-22 MED ORDER — ONDANSETRON HCL 8 MG PO TABS
8.0000 mg | ORAL_TABLET | Freq: Once | ORAL | Status: AC
  Administered 2013-02-22: 8 mg via ORAL

## 2013-02-22 MED ORDER — ZOLEDRONIC ACID 4 MG/5ML IV CONC
3.0000 mg | Freq: Once | INTRAVENOUS | Status: AC
  Administered 2013-02-22: 3 mg via INTRAVENOUS
  Filled 2013-02-22: qty 3.75

## 2013-02-22 MED ORDER — SODIUM CHLORIDE 0.9 % IJ SOLN
10.0000 mL | INTRAMUSCULAR | Status: DC | PRN
  Administered 2013-02-22: 10 mL
  Filled 2013-02-22: qty 10

## 2013-02-22 MED ORDER — HEPARIN SOD (PORK) LOCK FLUSH 100 UNIT/ML IV SOLN
500.0000 [IU] | Freq: Once | INTRAVENOUS | Status: AC | PRN
  Administered 2013-02-22: 500 [IU]
  Filled 2013-02-22: qty 5

## 2013-02-22 NOTE — Patient Instructions (Addendum)
[facility] Discharge Instructions for Patients Receiving Chemotherapy  Today you received the following chemotherapy agents:  Velcade  To help prevent nausea and vomiting after your treatment, we encourage you to take your nausea medication as ordered per MD.    If you develop nausea and vomiting that is not controlled by your nausea medication, call the clinic. If it is after clinic hours your family physician or the after hours number for the clinic or go to the Emergency Department.   BELOW ARE SYMPTOMS THAT SHOULD BE REPORTED IMMEDIATELY:  *FEVER GREATER THAN 100.5 F  *CHILLS WITH OR WITHOUT FEVER  NAUSEA AND VOMITING THAT IS NOT CONTROLLED WITH YOUR NAUSEA MEDICATION  *UNUSUAL SHORTNESS OF BREATH  *UNUSUAL BRUISING OR BLEEDING  TENDERNESS IN MOUTH AND THROAT WITH OR WITHOUT PRESENCE OF ULCERS  *URINARY PROBLEMS  *BOWEL PROBLEMS  UNUSUAL RASH Items with * indicate a potential emergency and should be followed up as soon as possible.   Please let the nurse know about any problems that you may have experienced. Feel free to call the clinic you have any questions or concerns. The clinic phone number is [phone].   I have been informed and understand all the instructions given to me. I know to contact the clinic, my physician, or go to the Emergency Department if any problems should occur. I do not have any questions at this time, but understand that I may call the clinic during office hours   should I have any questions or need assistance in obtaining follow up care.    __________________________________________  _____________  __________ Signature of Patient or Authorized Representative            Date                   Time    __________________________________________ Nurse's Signature    

## 2013-02-25 ENCOUNTER — Telehealth: Payer: Self-pay | Admitting: Oncology

## 2013-02-25 NOTE — Telephone Encounter (Signed)
Added zometa infusions for June/July/August. lmonvm informing pt and confirming next appt for 5/23. Pt to get new schedule when she comes in.

## 2013-02-27 ENCOUNTER — Encounter: Payer: Self-pay | Admitting: *Deleted

## 2013-02-27 NOTE — Progress Notes (Signed)
Fax received from [employer], they shipped Revlimid on 5/19 for delivery on 5/20.

## 2013-03-01 ENCOUNTER — Other Ambulatory Visit (HOSPITAL_BASED_OUTPATIENT_CLINIC_OR_DEPARTMENT_OTHER): Payer: Self-pay

## 2013-03-01 ENCOUNTER — Telehealth: Payer: Self-pay | Admitting: *Deleted

## 2013-03-01 ENCOUNTER — Ambulatory Visit (HOSPITAL_BASED_OUTPATIENT_CLINIC_OR_DEPARTMENT_OTHER): Payer: Self-pay

## 2013-03-01 ENCOUNTER — Ambulatory Visit: Payer: PRIVATE HEALTH INSURANCE | Admitting: Oncology

## 2013-03-01 VITALS — BP 139/74 | HR 83 | Temp 98.5°F | Resp 20

## 2013-03-01 DIAGNOSIS — D4989 Neoplasm of unspecified behavior of other specified sites: Secondary | ICD-10-CM

## 2013-03-01 DIAGNOSIS — C9000 Multiple myeloma not having achieved remission: Secondary | ICD-10-CM

## 2013-03-01 DIAGNOSIS — Z5112 Encounter for antineoplastic immunotherapy: Secondary | ICD-10-CM

## 2013-03-01 LAB — CBC WITH DIFFERENTIAL/PLATELET
BASO%: 0.1 % (ref 0.0–2.0)
Basophils Absolute: 0 10*3/uL (ref 0.0–0.1)
EOS%: 0.3 % (ref 0.0–7.0)
MCH: 34.2 pg — ABNORMAL HIGH (ref 25.1–34.0)
MCHC: 34.2 g/dL (ref 31.5–36.0)
MCV: 100 fL (ref 79.5–101.0)
MONO%: 1.7 % (ref 0.0–14.0)
RBC: 2.34 10*6/uL — ABNORMAL LOW (ref 3.70–5.45)
RDW: 18.5 % — ABNORMAL HIGH (ref 11.2–14.5)
lymph#: 1.4 10*3/uL (ref 0.9–3.3)

## 2013-03-01 LAB — PROTHROMBIN TIME
INR: 1.21 (ref <1.50)
Prothrombin Time: 15.1 seconds (ref 11.6–15.2)

## 2013-03-01 LAB — BASIC METABOLIC PANEL (CC13)
BUN: 18.5 mg/dL (ref 7.0–26.0)
Chloride: 112 mEq/L — ABNORMAL HIGH (ref 98–107)
Potassium: 4.5 mEq/L (ref 3.5–5.1)

## 2013-03-01 MED ORDER — HEPARIN SOD (PORK) LOCK FLUSH 100 UNIT/ML IV SOLN
500.0000 [IU] | Freq: Once | INTRAVENOUS | Status: AC
  Administered 2013-03-01: 500 [IU] via INTRAVENOUS
  Filled 2013-03-01: qty 5

## 2013-03-01 MED ORDER — BORTEZOMIB CHEMO SQ INJECTION 3.5 MG (2.5MG/ML)
1.3000 mg/m2 | Freq: Once | INTRAMUSCULAR | Status: AC
  Administered 2013-03-01: 2.25 mg via SUBCUTANEOUS
  Filled 2013-03-01: qty 2.25

## 2013-03-01 MED ORDER — SODIUM CHLORIDE 0.9 % IJ SOLN
10.0000 mL | INTRAMUSCULAR | Status: DC | PRN
  Administered 2013-03-01: 10 mL via INTRAVENOUS
  Filled 2013-03-01: qty 10

## 2013-03-01 MED ORDER — ONDANSETRON HCL 8 MG PO TABS
8.0000 mg | ORAL_TABLET | Freq: Once | ORAL | Status: AC
  Administered 2013-03-01: 8 mg via ORAL

## 2013-03-01 NOTE — Telephone Encounter (Signed)
Message copied by Wende Mott on Fri Mar 01, 2013  4:04 PM ------      Message from: Jethro Bolus T      Created: Fri Mar 01, 2013  3:29 PM       Please advise patient to increase Coumadin from 5mg  PO daily to:      5mg  PO daily on Mon/Wed/Fri/Sun      7.5mg  PO daily on Tue/Thu/Sat.             Thanks. ------

## 2013-03-01 NOTE — Telephone Encounter (Signed)
Informed pt of office visit w/ Jennifer Boyle canceled today as Jennifer Boyle out of office unexpectedly.  Instructed her to come in for lab at 2 pm and will try to get her scheduled w/ Jennifer Boyle prior to chemo appt for lab draw.  Pt wants all labs drawn from [facility] a Cath.  Informed her of order sent to Scheduler to add on a Jennifer appt w/ every lab for blood draw from [facility].  Explained to pt that Infusion [facility]le may have to draw labs today if Jennifer Boyle is unavailable.  She verbalized understanding.  States only new issue was one time "neuropathy" in her Toes 2 nights ago.  This has since resolved.  She had some relief from her pelvic/hip/leg and arm pain last weekend,  But some of the pain returned earlier this week.   Instructed pt to inform Dr. Lodema Pilot nurse if neuropathy returns and is persistent or any other new symptoms.  She verbalized understanding.

## 2013-03-01 NOTE — Telephone Encounter (Signed)
Instructed pt on increasing coumadin dose per Dr. Lodema Pilot message below.  She verbalized understanding.

## 2013-03-01 NOTE — Telephone Encounter (Signed)
appts made and printed...td 

## 2013-03-01 NOTE — Patient Instructions (Signed)

## 2013-03-08 ENCOUNTER — Ambulatory Visit: Payer: Self-pay

## 2013-03-08 ENCOUNTER — Other Ambulatory Visit: Payer: PRIVATE HEALTH INSURANCE | Admitting: Lab

## 2013-03-08 ENCOUNTER — Telehealth: Payer: Self-pay | Admitting: *Deleted

## 2013-03-08 ENCOUNTER — Ambulatory Visit (HOSPITAL_BASED_OUTPATIENT_CLINIC_OR_DEPARTMENT_OTHER): Payer: Self-pay

## 2013-03-08 ENCOUNTER — Other Ambulatory Visit (HOSPITAL_BASED_OUTPATIENT_CLINIC_OR_DEPARTMENT_OTHER): Payer: Self-pay

## 2013-03-08 VITALS — BP 132/63 | HR 79 | Temp 98.4°F

## 2013-03-08 DIAGNOSIS — C9000 Multiple myeloma not having achieved remission: Secondary | ICD-10-CM

## 2013-03-08 DIAGNOSIS — D4989 Neoplasm of unspecified behavior of other specified sites: Secondary | ICD-10-CM

## 2013-03-08 DIAGNOSIS — Z5112 Encounter for antineoplastic immunotherapy: Secondary | ICD-10-CM

## 2013-03-08 LAB — CBC WITH DIFFERENTIAL/PLATELET
Eosinophils Absolute: 0.1 10*3/uL (ref 0.0–0.5)
HCT: 23.2 % — ABNORMAL LOW (ref 34.8–46.6)
LYMPH%: 22.7 % (ref 14.0–49.7)
MONO#: 0.3 10*3/uL (ref 0.1–0.9)
NEUT#: 4.1 10*3/uL (ref 1.5–6.5)
NEUT%: 70.6 % (ref 38.4–76.8)
Platelets: 231 10*3/uL (ref 145–400)
WBC: 5.8 10*3/uL (ref 3.9–10.3)

## 2013-03-08 LAB — BASIC METABOLIC PANEL (CC13)
BUN: 24.7 mg/dL (ref 7.0–26.0)
CO2: 19 mEq/L — ABNORMAL LOW (ref 22–29)
Calcium: 8 mg/dL — ABNORMAL LOW (ref 8.4–10.4)
Creatinine: 1.9 mg/dL — ABNORMAL HIGH (ref 0.6–1.1)
Glucose: 96 mg/dl (ref 70–99)

## 2013-03-08 MED ORDER — BORTEZOMIB CHEMO SQ INJECTION 3.5 MG (2.5MG/ML)
1.3000 mg/m2 | Freq: Once | INTRAMUSCULAR | Status: AC
  Administered 2013-03-08: 2.25 mg via SUBCUTANEOUS
  Filled 2013-03-08: qty 2.25

## 2013-03-08 MED ORDER — HEPARIN SOD (PORK) LOCK FLUSH 100 UNIT/ML IV SOLN
500.0000 [IU] | Freq: Once | INTRAVENOUS | Status: AC
  Administered 2013-03-08: 500 [IU] via INTRAVENOUS
  Filled 2013-03-08: qty 5

## 2013-03-08 MED ORDER — ONDANSETRON HCL 8 MG PO TABS
8.0000 mg | ORAL_TABLET | Freq: Once | ORAL | Status: AC
  Administered 2013-03-08: 8 mg via ORAL

## 2013-03-08 MED ORDER — SODIUM CHLORIDE 0.9 % IJ SOLN
10.0000 mL | INTRAMUSCULAR | Status: DC | PRN
  Administered 2013-03-08: 10 mL via INTRAVENOUS
  Filled 2013-03-08: qty 10

## 2013-03-08 NOTE — Telephone Encounter (Signed)
Message copied by Wende Mott on Fri Mar 08, 2013  2:53 PM ------      Message from: Jethro Bolus T      Created: Fri Mar 08, 2013  2:39 PM       Please call pt and advise her to continue Coumadin, same dose (prophy while she is on Revlimid).  Thanks. ------

## 2013-03-08 NOTE — Patient Instructions (Addendum)
[facility] Discharge Instructions for Patients Receiving Chemotherapy  Today you received the following chemotherapy agents :  velcade subcutaneous  To help prevent nausea and vomiting after your treatment, we encourage you to take your nausea medication If you develop nausea and vomiting that is not controlled by your nausea medication, call the clinic. If it is after clinic hours your family physician or the after hours number for the clinic or go to the Emergency Department.   BELOW ARE SYMPTOMS THAT SHOULD BE REPORTED IMMEDIATELY:  *FEVER GREATER THAN 100.5 F  *CHILLS WITH OR WITHOUT FEVER  NAUSEA AND VOMITING THAT IS NOT CONTROLLED WITH YOUR NAUSEA MEDICATION  *UNUSUAL SHORTNESS OF BREATH  *UNUSUAL BRUISING OR BLEEDING  TENDERNESS IN MOUTH AND THROAT WITH OR WITHOUT PRESENCE OF ULCERS  *URINARY PROBLEMS  *BOWEL PROBLEMS  UNUSUAL RASH Items with * indicate a potential emergency and should be followed up as soon as possible.  Please let the nurse know about any problems that you may have experienced. Feel free to call the clinic you have any questions or concerns. The clinic phone number is [phone].   I have been informed and understand all the instructions given to me. I know to contact the clinic, my physician, or go to the Emergency Department if any problems should occur. I do not have any questions at this time, but understand that I may call the clinic during office hours   should I have any questions or need assistance in obtaining follow up care.    __________________________________________  _____________  __________ Signature of Patient or Authorized Representative            Date                   Time    __________________________________________ Nurse's Signature

## 2013-03-08 NOTE — Telephone Encounter (Signed)
Message copied by Wende Mott on Fri Mar 08, 2013  4:52 PM ------      Message from: HA, Raliegh Ip T      Created: Fri Mar 08, 2013  3:08 PM       Please call pt with result of her CBC and Cr.  Her renal function has continued to improve.  For some reason, she is no longer on Mychart.  She is always eager to find out her result.  Thanks. ------

## 2013-03-08 NOTE — Telephone Encounter (Signed)
Spoke w/ pt in Infusion room.  Gave her a copy of labs w/ Dr. Lodema Pilot note to continue same dose of coumadin. She verbalized understanding.  Pt also reports some mild neuropathy at the tip of her toes and asks if anything can be done for it?  Informed pt we usually don't treat neuropathy if mild and not too bothersome and likely too soon for the Revlimid to be causing this problem.  Pt aknowledges she has had this issue in the past a few years ago and it resolved.  She was also concerned about some color changes, blanching at the tip of her right big toe.  This RN could not assess/observe and abnormality.  Instructed pt to notify us if neuropathy worsens or any other new concerning symptoms.  She verbalized understanding.

## 2013-03-08 NOTE — Telephone Encounter (Signed)
Called pt w/ Dr. Lodema Pilot message below and Creatinine result.  She was "kicked outPension scheme manager for logging in w/ incorrect password too many times.   She will try it again tomorrow and if it doesn't work she will call the Help number.

## 2013-03-09 ENCOUNTER — Encounter: Payer: Self-pay | Admitting: Oncology

## 2013-03-11 ENCOUNTER — Other Ambulatory Visit (HOSPITAL_BASED_OUTPATIENT_CLINIC_OR_DEPARTMENT_OTHER): Payer: No Typology Code available for payment source | Admitting: Lab

## 2013-03-11 ENCOUNTER — Ambulatory Visit (HOSPITAL_BASED_OUTPATIENT_CLINIC_OR_DEPARTMENT_OTHER): Payer: No Typology Code available for payment source

## 2013-03-11 ENCOUNTER — Telehealth: Payer: Self-pay | Admitting: *Deleted

## 2013-03-11 VITALS — BP 140/89 | HR 76 | Temp 97.0°F

## 2013-03-11 DIAGNOSIS — C9000 Multiple myeloma not having achieved remission: Secondary | ICD-10-CM

## 2013-03-11 DIAGNOSIS — D4989 Neoplasm of unspecified behavior of other specified sites: Secondary | ICD-10-CM

## 2013-03-11 DIAGNOSIS — C9002 Multiple myeloma in relapse: Secondary | ICD-10-CM

## 2013-03-11 DIAGNOSIS — Z452 Encounter for adjustment and management of vascular access device: Secondary | ICD-10-CM

## 2013-03-11 LAB — CBC WITH DIFFERENTIAL/PLATELET
BASO%: 0.1 % (ref 0.0–2.0)
HCT: 23.9 % — ABNORMAL LOW (ref 34.8–46.6)
LYMPH%: 39.3 % (ref 14.0–49.7)
MCHC: 34.1 g/dL (ref 31.5–36.0)
MONO#: 0.8 10*3/uL (ref 0.1–0.9)
NEUT%: 45.5 % (ref 38.4–76.8)
Platelets: 254 10*3/uL (ref 145–400)
WBC: 5.9 10*3/uL (ref 3.9–10.3)

## 2013-03-11 LAB — BASIC METABOLIC PANEL (CC13)
BUN: 17.2 mg/dL (ref 7.0–26.0)
CO2: 22 mEq/L (ref 22–29)
Calcium: 8.1 mg/dL — ABNORMAL LOW (ref 8.4–10.4)
Creatinine: 1.7 mg/dL — ABNORMAL HIGH (ref 0.6–1.1)
Glucose: 109 mg/dl — ABNORMAL HIGH (ref 70–99)

## 2013-03-11 MED ORDER — HEPARIN SOD (PORK) LOCK FLUSH 100 UNIT/ML IV SOLN
500.0000 [IU] | Freq: Once | INTRAVENOUS | Status: AC
  Administered 2013-03-11: 500 [IU] via INTRAVENOUS
  Filled 2013-03-11: qty 5

## 2013-03-11 MED ORDER — SODIUM CHLORIDE 0.9 % IJ SOLN
10.0000 mL | INTRAMUSCULAR | Status: DC | PRN
  Administered 2013-03-11: 10 mL via INTRAVENOUS
  Filled 2013-03-11: qty 10

## 2013-03-11 NOTE — Telephone Encounter (Signed)
Called patient to inform her of BMP.  She was contacted by collaborative nurse but can't see results on MyChart.  Results will be automatically released.

## 2013-03-12 ENCOUNTER — Other Ambulatory Visit: Payer: Self-pay | Admitting: Licensed Clinical Social Worker

## 2013-03-12 ENCOUNTER — Encounter: Payer: Self-pay | Admitting: Infectious Disease

## 2013-03-12 DIAGNOSIS — B20 Human immunodeficiency virus [HIV] disease: Secondary | ICD-10-CM

## 2013-03-12 MED ORDER — SODIUM BICARBONATE 650 MG PO TABS: 650.0000 mg | ORAL_TABLET | Freq: Three times a day (TID) | ORAL | Status: DC

## 2013-03-13 ENCOUNTER — Telehealth: Payer: Self-pay | Admitting: *Deleted

## 2013-03-13 DIAGNOSIS — D4989 Neoplasm of unspecified behavior of other specified sites: Secondary | ICD-10-CM

## 2013-03-13 LAB — PROTEIN ELECTROPHORESIS, SERUM
Alpha-1-Globulin: 4.6 % (ref 2.9–4.9)
Beta 2: 3.6 % (ref 3.2–6.5)
Gamma Globulin: 28.1 % — ABNORMAL HIGH (ref 11.1–18.8)

## 2013-03-13 MED ORDER — LENALIDOMIDE 15 MG PO CAPS: 15.0000 mg | ORAL_CAPSULE | ORAL | Status: DC

## 2013-03-13 NOTE — Telephone Encounter (Signed)
[facility] called for clarification of revlimid 15 mg QOD.  Called patient but "patient didn't quite get what they were asking".  Asked if there is a 7 - day rest as the quantity is only ten pills.  Verbal order received and read back for a seven day test per Dr. Gaylyn Rong.  Burnetta Sabin given this order.

## 2013-03-18 ENCOUNTER — Other Ambulatory Visit: Payer: PRIVATE HEALTH INSURANCE | Admitting: Lab

## 2013-03-18 ENCOUNTER — Other Ambulatory Visit: Payer: Self-pay | Admitting: *Deleted

## 2013-03-18 ENCOUNTER — Ambulatory Visit (HOSPITAL_BASED_OUTPATIENT_CLINIC_OR_DEPARTMENT_OTHER): Payer: No Typology Code available for payment source

## 2013-03-18 ENCOUNTER — Telehealth: Payer: Self-pay | Admitting: Oncology

## 2013-03-18 ENCOUNTER — Ambulatory Visit (HOSPITAL_BASED_OUTPATIENT_CLINIC_OR_DEPARTMENT_OTHER): Payer: No Typology Code available for payment source | Admitting: Oncology

## 2013-03-18 ENCOUNTER — Other Ambulatory Visit (HOSPITAL_BASED_OUTPATIENT_CLINIC_OR_DEPARTMENT_OTHER): Payer: No Typology Code available for payment source

## 2013-03-18 VITALS — BP 146/78 | HR 73 | Temp 98.1°F | Resp 18 | Ht 64.0 in | Wt 157.9 lb

## 2013-03-18 DIAGNOSIS — C9000 Multiple myeloma not having achieved remission: Secondary | ICD-10-CM

## 2013-03-18 DIAGNOSIS — C9002 Multiple myeloma in relapse: Secondary | ICD-10-CM

## 2013-03-18 DIAGNOSIS — D4989 Neoplasm of unspecified behavior of other specified sites: Secondary | ICD-10-CM

## 2013-03-18 LAB — CBC WITH DIFFERENTIAL/PLATELET
BASO%: 0.4 % (ref 0.0–2.0)
Basophils Absolute: 0 10*3/uL (ref 0.0–0.1)
EOS%: 2.2 % (ref 0.0–7.0)
HCT: 23.6 % — ABNORMAL LOW (ref 34.8–46.6)
HGB: 8.2 g/dL — ABNORMAL LOW (ref 11.6–15.9)
LYMPH%: 31.2 % (ref 14.0–49.7)
MCH: 34.9 pg — ABNORMAL HIGH (ref 25.1–34.0)
MCHC: 34.6 g/dL (ref 31.5–36.0)
MCV: 100.9 fL (ref 79.5–101.0)
MONO%: 15.1 % — ABNORMAL HIGH (ref 0.0–14.0)
NEUT%: 51.1 % (ref 38.4–76.8)
Platelets: 291 10*3/uL (ref 145–400)
lymph#: 1.8 10*3/uL (ref 0.9–3.3)

## 2013-03-18 LAB — BASIC METABOLIC PANEL (CC13)
BUN: 24.9 mg/dL (ref 7.0–26.0)
Calcium: 8.8 mg/dL (ref 8.4–10.4)
Chloride: 111 mEq/L — ABNORMAL HIGH (ref 98–107)
Creatinine: 1.5 mg/dL — ABNORMAL HIGH (ref 0.6–1.1)

## 2013-03-18 LAB — PROTHROMBIN TIME: INR: 3.13 — ABNORMAL HIGH (ref <1.50)

## 2013-03-18 MED ORDER — WARFARIN SODIUM 5 MG PO TABS: 5.0000 mg | ORAL_TABLET | Freq: Every day | ORAL | Status: DC

## 2013-03-18 MED ORDER — SODIUM CHLORIDE 0.9 % IJ SOLN
10.0000 mL | INTRAMUSCULAR | Status: DC | PRN
  Administered 2013-03-18: 10 mL via INTRAVENOUS
  Filled 2013-03-18: qty 10

## 2013-03-18 MED ORDER — HEPARIN SOD (PORK) LOCK FLUSH 100 UNIT/ML IV SOLN
500.0000 [IU] | Freq: Once | INTRAVENOUS | Status: AC
  Administered 2013-03-18: 500 [IU] via INTRAVENOUS
  Filled 2013-03-18: qty 5

## 2013-03-18 NOTE — Telephone Encounter (Signed)
gv and printed appt sched and avs for pt.Jennifer KitchenMarland Kitchenpt wanted afternoon appt.Jennifer KitchenMarland KitchenDone

## 2013-03-18 NOTE — Telephone Encounter (Signed)
THIS REFILL REQUEST FOR REVLIMID WAS PLACED IN DR.HA'S ACTIVE WORK FOLDER. 

## 2013-03-19 NOTE — Progress Notes (Signed)
[facility]  [phone] [phone]     HEMATOLOGY - OFFICE PROGRESS NOTE    DIAGNOSIS: IgG kappa multiple myeloma; presented with anemia, acute renal failure. Initial M spike was 5.15 g/dL; IgG 7040 mg/dL; positive Bence Jones protein. Bone marrow biopsy on 01/11/2013  showed 40% plasma cell. Cytogenetics and myeloma FISH panel are pending.   CURRENT THERAPY:  Velcade/Dex started in April 2014.  Revlimid was added to the regimen when her renal function stabilized, and Zometa after she had dental clearance in May 2014.   INTERVAL HISTORY: Ms. Jennifer Boyle returned to clinic for follow up by herself.  She did well with chemo addition of Revlimid.  She has mild fatigue.  She is still independent of all activities of daily living. She has mild neuropathy in the feet which does not interfere with her activities of her daily living.  She denied fever, mucositis, nausea/vomiting, bleeding, skin rash, skin ulcer. She denied bone pain.  She attributed the left flank pain to muscle spasm.  She does not take pain med regularly.     Past Medical History  Diagnosis Date  . Pneumonia 10/2002; 2010    Required INTUBATION for streptococcal pneumonia/septicemia (10/2002)  . HIV disease DX: 01/2002  . Fibroid uterus   . 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 Peripheral neuropathy     resolved.   . Multiple myeloma 01/2013    01/11/2013 cytogenetics showed 59, XX, +2, +4, +5, +7, add(7)(p22), +, +9, +11, der(11) t(1;11)(q11; q24), +12, +15, -16, +17, +19, +20, +mar1, +mar2[cp6] / 46, XX [17].   Myeloma FISH showed presence of +11, and +17.   :    Past Surgical History    Procedure Laterality Date  . No past surgeries    :   CURRENT MEDS: Current Outpatient Prescriptions  Medication Sig Dispense Refill  . abacavir (ZIAGEN) 300 MG tablet Take 2 tablets (600 mg total) by mouth daily.  60 tablet  11  . acyclovir (ZOVIRAX) 400 MG tablet Take 0.5 tablets (200 mg total) by mouth daily.  60 tablet  3  . dexamethasone (DECADRON) 4 MG tablet Take 10 tablets (40 mg total) by mouth once a week.  200 tablet  2  . Dolutegravir Sodium 50 MG TABS Take 50 mg by mouth 2 (two) times daily.       [name] lamivudine (EPIVIR) 100 MG tablet Take 1 tablet (100 mg total) by mouth daily.  30 tablet  11  . lenalidomide (REVLIMID) 15 MG capsule Take 1 capsule (15 mg total) by mouth every other day. Rest 7 days and repeat  10 capsule  0  . lidocaine-prilocaine (EMLA) cream Apply topically as needed. Apply to portacath one hour before chemo access or blood draw.  30 g  3  . prochlorperazine (COMPAZINE) 10 MG tablet Take 1 tablet (10 mg total) by mouth every 6 (six) hours as needed (Nausea or vomiting).  30 tablet  1  . promethazine (PHENERGAN) 25 MG tablet Take 1 tablet (25 mg total) by mouth every 6 (six) hours as needed for nausea.  30 tablet  0  . sodium bicarbonate 650 MG tablet Take 1 tablet (650  mg total) by mouth 3 (three) times daily.  90 tablet  1  . warfarin (COUMADIN) 5 MG tablet Take 1 tablet (5 mg total) by mouth daily.  60 tablet  3   No current facility-administered medications for this visit.      Allergies  Allergen Reactions  . Aspirin     Upset stomach   . Efavirenz Hives and Rash    (brand name sustiva)  :  Family History  Problem Relation Age of Onset  . Hypertension Mother   :  History   Social History  . Marital Status: Single    Spouse Name: N/A    Number of Children: 0  . Years of Education: N/A   Occupational History  .      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    Social History Main Topics  . Smoking status: Never Smoker   . Smokeless  tobacco: Never Used  . Alcohol Use: No  . Drug Use: No  . Sexually Active: Not Currently   Other Topics Concern  . Not on file   Social History Narrative   The patient is single. The patient has no children.   The patient is originally from [place] then moved to the [place] approximately 25 years ago.   The patient works as a Tour manager Banker).   Patient has never smoked. The patient does not use alcohol.    The patient has never used smokeless tobacco. The patient denies IV drug abuse.  :  REVIEW OF SYSTEM:  The rest of the 14-point review of sytem was negative.   Exam: ECOG 0-1  General:  well-nourished  woman,in no acute distress.  Eyes:  no scleral icterus.  ENT:  There were no oropharyngeal lesions.  Neck was without thyromegaly.  Lymphatics:  Negative cervical, supraclavicular or axillary adenopathy.  Respiratory: lungs were clear bilaterally without wheezing or crackles.  Cardiovascular:  Regular rate and rhythm, S1/S2, without murmur, rub or gallop.  There was no pedal edema.  GI:  abdomen was soft, flat, nontender, nondistended, without organomegaly.  Muscoloskeletal:  no spinal tenderness of palpation of vertebral spine.  Skin exam was without echymosis, petichae.  Neuro exam was nonfocal.  Patient was able to get on and off exam table without assistance.  Gait was normal.  Patient was alerted and oriented.  Attention was good.   Language was appropriate.  Mood was normal without depression.  Speech was not pressured.  Thought content was not tangential.    LABS:  Lab Results  Component Value Date   WBC 5.8 03/18/2013   HGB 8.2* 03/18/2013   HCT 23.6* 03/18/2013   PLT 291 03/18/2013   GLUCOSE 115* 03/18/2013   CHOL 174 07/31/2012   TRIG 78 07/31/2012   HDL 40 07/31/2012   LDLCALC 118* 07/31/2012   ALT 14 02/12/2013   AST 19 02/12/2013   NA 140 03/18/2013   K 3.9 03/18/2013   CL 111* 03/18/2013   CREATININE 1.5* 03/18/2013   BUN 24.9 03/18/2013   CO2 21* 03/18/2013   INR 3.13*  03/18/2013   MICROALBUR 48.81* 12/20/2012    ASSESSMENT AND PLAN:    1.  Multiple myeloma:  - continue to have good response now on Revlimid/Dex/Velcade.  - As her renal function has significantly improved, I increased her Revlimid to 15mg  PO daily.  - I recommend her to continue Coumadin at current dose 5mg  qday Mon/Wed/Fri/Sun and 7.5 mg on Tue/Thu/Sat.  - To prevent risk  of herpes reactivation, I advise her to continue Acyclovir.   She expressed informed understanding and wished to proceed with the addition of Revlimid to the current regimen of Velcade/Dexamethasone.    2.  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 She has not had side effect.   3.  Long term plan:  - She has no insurance.  She was denied MediCaid due to having more than $2,000 in her bank account.  She will apply again soon when she qualifies so that she can be referred to BMT.   4.  HIV/AIDS:  On abacavir, Dolutegravir, Lamuvidine per ID.   5.  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  Cr has significantly improved.   6.  Anemia:  Due to myeloma, renal insufficiency, chemo treatment.  She has no active bleeding nor severe symptoms of anemia.  We will continue to check weekly CBC and consider transfusion when Hgb <7.   7.  Follow up:  Weekly lab and chemo.  Return visit in about 1 month.     The length of time of the face-to-face encounter was 25 minutes. More than 50% of time was spent counseling and coordination of care.     Matt Delpizzo T. Gaylyn Rong, M.D.

## 2013-03-20 ENCOUNTER — Encounter: Payer: Self-pay | Admitting: *Deleted

## 2013-03-20 NOTE — Progress Notes (Signed)
Fax received from [facility], they shipped pt's Revlimid on 6/10 for delivery on 6/11.

## 2013-03-22 ENCOUNTER — Ambulatory Visit (HOSPITAL_BASED_OUTPATIENT_CLINIC_OR_DEPARTMENT_OTHER): Payer: Self-pay

## 2013-03-22 ENCOUNTER — Other Ambulatory Visit: Payer: Self-pay | Admitting: *Deleted

## 2013-03-22 ENCOUNTER — Encounter: Payer: Self-pay | Admitting: Oncology

## 2013-03-22 ENCOUNTER — Other Ambulatory Visit: Payer: PRIVATE HEALTH INSURANCE | Admitting: Lab

## 2013-03-22 ENCOUNTER — Other Ambulatory Visit (HOSPITAL_BASED_OUTPATIENT_CLINIC_OR_DEPARTMENT_OTHER): Payer: Self-pay

## 2013-03-22 DIAGNOSIS — C9000 Multiple myeloma not having achieved remission: Secondary | ICD-10-CM

## 2013-03-22 DIAGNOSIS — D4989 Neoplasm of unspecified behavior of other specified sites: Secondary | ICD-10-CM

## 2013-03-22 DIAGNOSIS — Z5112 Encounter for antineoplastic immunotherapy: Secondary | ICD-10-CM

## 2013-03-22 LAB — CBC WITH DIFFERENTIAL/PLATELET
Basophils Absolute: 0 10*3/uL (ref 0.0–0.1)
EOS%: 0.4 % (ref 0.0–7.0)
HGB: 8.5 g/dL — ABNORMAL LOW (ref 11.6–15.9)
MCH: 35.2 pg — ABNORMAL HIGH (ref 25.1–34.0)
MCV: 100.8 fL (ref 79.5–101.0)
MONO%: 2.7 % (ref 0.0–14.0)
RBC: 2.41 10*6/uL — ABNORMAL LOW (ref 3.70–5.45)
RDW: 17.5 % — ABNORMAL HIGH (ref 11.2–14.5)

## 2013-03-22 LAB — BASIC METABOLIC PANEL (CC13)
BUN: 18.6 mg/dL (ref 7.0–26.0)
Calcium: 8.7 mg/dL (ref 8.4–10.4)
Creatinine: 1.4 mg/dL — ABNORMAL HIGH (ref 0.6–1.1)
Potassium: 4.4 mEq/L (ref 3.5–5.1)

## 2013-03-22 LAB — PROTHROMBIN TIME
INR: 1.77 — ABNORMAL HIGH (ref <1.50)
Prothrombin Time: 20 seconds — ABNORMAL HIGH (ref 11.6–15.2)

## 2013-03-22 MED ORDER — ONDANSETRON HCL 8 MG PO TABS
8.0000 mg | ORAL_TABLET | Freq: Once | ORAL | Status: AC
  Administered 2013-03-22: 8 mg via ORAL

## 2013-03-22 MED ORDER — BORTEZOMIB CHEMO SQ INJECTION 3.5 MG (2.5MG/ML)
1.3000 mg/m2 | Freq: Once | INTRAMUSCULAR | Status: AC
  Administered 2013-03-22: 2.25 mg via SUBCUTANEOUS
  Filled 2013-03-22: qty 2.25

## 2013-03-22 MED ORDER — ZOLEDRONIC ACID 4 MG/5ML IV CONC
3.0000 mg | Freq: Once | INTRAVENOUS | Status: AC
  Administered 2013-03-22: 3 mg via INTRAVENOUS
  Filled 2013-03-22: qty 3.75

## 2013-03-22 MED ORDER — HEPARIN SOD (PORK) LOCK FLUSH 100 UNIT/ML IV SOLN
500.0000 [IU] | Freq: Once | INTRAVENOUS | Status: AC | PRN
  Administered 2013-03-22: 500 [IU]
  Filled 2013-03-22: qty 5

## 2013-03-22 MED ORDER — SODIUM CHLORIDE 0.9 % IV SOLN
Freq: Once | INTRAVENOUS | Status: AC
  Administered 2013-03-22: 13:00:00 via INTRAVENOUS

## 2013-03-22 MED ORDER — SODIUM CHLORIDE 0.9 % IJ SOLN
10.0000 mL | INTRAMUSCULAR | Status: DC | PRN
  Administered 2013-03-22: 10 mL
  Filled 2013-03-22: qty 10

## 2013-03-22 NOTE — Patient Instructions (Signed)
Patient to infusion room for treatment

## 2013-03-22 NOTE — Patient Instructions (Signed)
[facility] Discharge Instructions for Patients Receiving Chemotherapy  Today you received the following chemotherapy agents velcade, zometa  To help prevent nausea and vomiting after your treatment, we encourage you to take your nausea medication as needed   If you develop nausea and vomiting that is not controlled by your nausea medication, call the clinic.   BELOW ARE SYMPTOMS THAT SHOULD BE REPORTED IMMEDIATELY:  *FEVER GREATER THAN 100.5 F  *CHILLS WITH OR WITHOUT FEVER  NAUSEA AND VOMITING THAT IS NOT CONTROLLED WITH YOUR NAUSEA MEDICATION  *UNUSUAL SHORTNESS OF BREATH  *UNUSUAL BRUISING OR BLEEDING  TENDERNESS IN MOUTH AND THROAT WITH OR WITHOUT PRESENCE OF ULCERS  *URINARY PROBLEMS  *BOWEL PROBLEMS  UNUSUAL RASH Items with * indicate a potential emergency and should be followed up as soon as possible.  Feel free to call the clinic you have any questions or concerns. The clinic phone number is [phone].

## 2013-03-22 NOTE — Telephone Encounter (Signed)
Called pharmacy to verify rx from 03/18/13, notified patient it was waiting.

## 2013-03-28 ENCOUNTER — Other Ambulatory Visit: Payer: Self-pay | Admitting: *Deleted

## 2013-03-28 DIAGNOSIS — C9000 Multiple myeloma not having achieved remission: Secondary | ICD-10-CM

## 2013-03-28 DIAGNOSIS — D4989 Neoplasm of unspecified behavior of other specified sites: Secondary | ICD-10-CM

## 2013-03-28 MED ORDER — WARFARIN SODIUM 5 MG PO TABS: 5.0000 mg | ORAL_TABLET | Freq: Every day | ORAL | Status: DC

## 2013-03-28 NOTE — Telephone Encounter (Signed)
Patient called in response to this nurses MyChart response about picking up her prescription on tomorrow.  Reports she doesn't know why order was sent to a [facility].  Preferred pharmacy is [facility].  Reports another provider eroneously sent an order her a while back and it is out of her way.  Comes tomorrow for lab.  Order has now been sent to [facility].  Jennifer Boyle will get this ready for patient and asked that we call tomorrow if any changes are needed in the sig or quantity.

## 2013-03-29 ENCOUNTER — Ambulatory Visit (HOSPITAL_BASED_OUTPATIENT_CLINIC_OR_DEPARTMENT_OTHER): Payer: Self-pay

## 2013-03-29 ENCOUNTER — Ambulatory Visit: Payer: Self-pay

## 2013-03-29 ENCOUNTER — Other Ambulatory Visit: Payer: PRIVATE HEALTH INSURANCE | Admitting: Lab

## 2013-03-29 ENCOUNTER — Other Ambulatory Visit (HOSPITAL_BASED_OUTPATIENT_CLINIC_OR_DEPARTMENT_OTHER): Payer: Self-pay

## 2013-03-29 VITALS — BP 148/90 | HR 80 | Temp 99.0°F | Resp 18

## 2013-03-29 VITALS — BP 147/76 | HR 75 | Temp 97.5°F | Resp 18

## 2013-03-29 DIAGNOSIS — C9000 Multiple myeloma not having achieved remission: Secondary | ICD-10-CM

## 2013-03-29 DIAGNOSIS — D4989 Neoplasm of unspecified behavior of other specified sites: Secondary | ICD-10-CM

## 2013-03-29 DIAGNOSIS — Z5112 Encounter for antineoplastic immunotherapy: Secondary | ICD-10-CM

## 2013-03-29 LAB — PROTHROMBIN TIME
INR: 1.41 (ref <1.50)
Prothrombin Time: 16.9 seconds — ABNORMAL HIGH (ref 11.6–15.2)

## 2013-03-29 LAB — CBC WITH DIFFERENTIAL/PLATELET
BASO%: 0.6 % (ref 0.0–2.0)
Basophils Absolute: 0 10*3/uL (ref 0.0–0.1)
EOS%: 3.1 % (ref 0.0–7.0)
HCT: 26 % — ABNORMAL LOW (ref 34.8–46.6)
HGB: 9 g/dL — ABNORMAL LOW (ref 11.6–15.9)
MCH: 35.4 pg — ABNORMAL HIGH (ref 25.1–34.0)
MCHC: 34.6 g/dL (ref 31.5–36.0)
MCV: 102.5 fL — ABNORMAL HIGH (ref 79.5–101.0)
MONO%: 9.7 % (ref 0.0–14.0)
NEUT%: 54.1 % (ref 38.4–76.8)
lymph#: 1.6 10*3/uL (ref 0.9–3.3)

## 2013-03-29 LAB — BASIC METABOLIC PANEL (CC13)
BUN: 20.6 mg/dL (ref 7.0–26.0)
Calcium: 8.6 mg/dL (ref 8.4–10.4)
Chloride: 111 mEq/L — ABNORMAL HIGH (ref 98–107)
Creatinine: 1.5 mg/dL — ABNORMAL HIGH (ref 0.6–1.1)

## 2013-03-29 MED ORDER — BORTEZOMIB CHEMO SQ INJECTION 3.5 MG (2.5MG/ML)
1.3000 mg/m2 | Freq: Once | INTRAMUSCULAR | Status: AC
  Administered 2013-03-29: 2.25 mg via SUBCUTANEOUS
  Filled 2013-03-29: qty 2.25

## 2013-03-29 MED ORDER — ONDANSETRON HCL 8 MG PO TABS
8.0000 mg | ORAL_TABLET | Freq: Once | ORAL | Status: AC
  Administered 2013-03-29: 8 mg via ORAL

## 2013-03-29 MED ORDER — SODIUM CHLORIDE 0.9 % IJ SOLN
10.0000 mL | INTRAMUSCULAR | Status: DC | PRN
  Administered 2013-03-29: 10 mL via INTRAVENOUS
  Filled 2013-03-29: qty 10

## 2013-03-29 MED ORDER — HEPARIN SOD (PORK) LOCK FLUSH 100 UNIT/ML IV SOLN
500.0000 [IU] | Freq: Once | INTRAVENOUS | Status: AC
  Administered 2013-03-29: 500 [IU] via INTRAVENOUS
  Filled 2013-03-29: qty 5

## 2013-03-29 NOTE — Patient Instructions (Addendum)
[facility] Discharge Instructions for Patients Receiving Chemotherapy  Today you received the following chemotherapy agents :  Velcade.  To help prevent nausea and vomiting after your treatment, we encourage you to take your nausea medication as instructed by your physician.   If you develop nausea and vomiting that is not controlled by your nausea medication, call the clinic.   BELOW ARE SYMPTOMS THAT SHOULD BE REPORTED IMMEDIATELY:  *FEVER GREATER THAN 100.5 F  *CHILLS WITH OR WITHOUT FEVER  NAUSEA AND VOMITING THAT IS NOT CONTROLLED WITH YOUR NAUSEA MEDICATION  *UNUSUAL SHORTNESS OF BREATH  *UNUSUAL BRUISING OR BLEEDING  TENDERNESS IN MOUTH AND THROAT WITH OR WITHOUT PRESENCE OF ULCERS  *URINARY PROBLEMS  *BOWEL PROBLEMS  UNUSUAL RASH Items with * indicate a potential emergency and should be followed up as soon as possible.  Feel free to call the clinic you have any questions or concerns. The clinic phone number is [phone].    

## 2013-03-29 NOTE — Patient Instructions (Signed)
Patient to infusion room

## 2013-04-01 ENCOUNTER — Encounter: Payer: Self-pay | Admitting: Infectious Disease

## 2013-04-01 ENCOUNTER — Telehealth: Payer: Self-pay | Admitting: *Deleted

## 2013-04-01 NOTE — Telephone Encounter (Signed)
Pt reports she is currently taking Coumadin 5 mg on Sun, Mon, Wed & Fri alternate w/ 7.5 mg on Tues, Thurs and Sat.  She has not missed any doses.

## 2013-04-01 NOTE — Telephone Encounter (Signed)
Message copied by Wende Mott on Mon Apr 01, 2013  2:04 PM ------      Message from: HA, Raliegh Ip T      Created: Mon Apr 01, 2013  1:46 PM       Please call pt to confirm that she is still taking Coumadin and if so, what's her dose; so that we can adjust the dose.  Thanks. ------

## 2013-04-01 NOTE — Telephone Encounter (Signed)
Please advise her to increase Coumadin to 7.5mg  PO everyday now (same dose).  Thanks.

## 2013-04-01 NOTE — Telephone Encounter (Signed)
Instructed pt to increase coumadin to 7.5 mg daily starting today.  Keep appts this week on Friday as scheduled. She verbalized understanding.

## 2013-04-05 ENCOUNTER — Encounter: Payer: Self-pay | Admitting: Oncology

## 2013-04-05 ENCOUNTER — Other Ambulatory Visit: Payer: PRIVATE HEALTH INSURANCE | Admitting: Lab

## 2013-04-05 ENCOUNTER — Other Ambulatory Visit (HOSPITAL_BASED_OUTPATIENT_CLINIC_OR_DEPARTMENT_OTHER): Payer: No Typology Code available for payment source

## 2013-04-05 ENCOUNTER — Ambulatory Visit (HOSPITAL_BASED_OUTPATIENT_CLINIC_OR_DEPARTMENT_OTHER): Payer: No Typology Code available for payment source

## 2013-04-05 ENCOUNTER — Other Ambulatory Visit: Payer: Self-pay | Admitting: Oncology

## 2013-04-05 ENCOUNTER — Ambulatory Visit: Payer: Self-pay

## 2013-04-05 DIAGNOSIS — C9000 Multiple myeloma not having achieved remission: Secondary | ICD-10-CM

## 2013-04-05 DIAGNOSIS — B20 Human immunodeficiency virus [HIV] disease: Secondary | ICD-10-CM

## 2013-04-05 DIAGNOSIS — Z5112 Encounter for antineoplastic immunotherapy: Secondary | ICD-10-CM

## 2013-04-05 DIAGNOSIS — D4989 Neoplasm of unspecified behavior of other specified sites: Secondary | ICD-10-CM

## 2013-04-05 LAB — CBC WITH DIFFERENTIAL/PLATELET
Basophils Absolute: 0 10*3/uL (ref 0.0–0.1)
Eosinophils Absolute: 0.2 10*3/uL (ref 0.0–0.5)
HCT: 28.3 % — ABNORMAL LOW (ref 34.8–46.6)
HGB: 9.1 g/dL — ABNORMAL LOW (ref 11.6–15.9)
MCV: 101.1 fL — ABNORMAL HIGH (ref 79.5–101.0)
MONO%: 9.3 % (ref 0.0–14.0)
NEUT#: 5.3 10*3/uL (ref 1.5–6.5)
NEUT%: 62.3 % (ref 38.4–76.8)
RDW: 15.1 % — ABNORMAL HIGH (ref 11.2–14.5)
lymph#: 2.2 10*3/uL (ref 0.9–3.3)

## 2013-04-05 LAB — BASIC METABOLIC PANEL (CC13)
BUN: 14.4 mg/dL (ref 7.0–26.0)
CO2: 20 mEq/L — ABNORMAL LOW (ref 22–29)
Glucose: 133 mg/dl (ref 70–140)
Potassium: 3.9 mEq/L (ref 3.5–5.1)
Sodium: 139 mEq/L (ref 136–145)

## 2013-04-05 MED ORDER — BORTEZOMIB CHEMO SQ INJECTION 3.5 MG (2.5MG/ML)
1.3000 mg/m2 | Freq: Once | INTRAMUSCULAR | Status: AC
  Administered 2013-04-05: 2.25 mg via SUBCUTANEOUS
  Filled 2013-04-05: qty 2.25

## 2013-04-05 MED ORDER — ONDANSETRON HCL 8 MG PO TABS
8.0000 mg | ORAL_TABLET | Freq: Once | ORAL | Status: AC
  Administered 2013-04-05: 8 mg via ORAL

## 2013-04-05 NOTE — Progress Notes (Signed)
Patient to be accessed for stat labs before treatment.

## 2013-04-05 NOTE — Patient Instructions (Addendum)
[facility] Discharge Instructions for Patients Receiving Chemotherapy  Today you received the following chemotherapy agents: Velcade.  To help prevent nausea and vomiting after your treatment, we encourage you to take your nausea medication as prescribed.   If you develop nausea and vomiting that is not controlled by your nausea medication, call the clinic.   BELOW ARE SYMPTOMS THAT SHOULD BE REPORTED IMMEDIATELY:  *FEVER GREATER THAN 100.5 F  *CHILLS WITH OR WITHOUT FEVER  NAUSEA AND VOMITING THAT IS NOT CONTROLLED WITH YOUR NAUSEA MEDICATION  *UNUSUAL SHORTNESS OF BREATH  *UNUSUAL BRUISING OR BLEEDING  TENDERNESS IN MOUTH AND THROAT WITH OR WITHOUT PRESENCE OF ULCERS  *URINARY PROBLEMS  *BOWEL PROBLEMS  UNUSUAL RASH Items with * indicate a potential emergency and should be followed up as soon as possible.  Feel free to call the clinic you have any questions or concerns. The clinic phone number is [phone].    

## 2013-04-08 ENCOUNTER — Encounter: Payer: Self-pay | Admitting: Oncology

## 2013-04-09 ENCOUNTER — Other Ambulatory Visit: Payer: Self-pay | Admitting: *Deleted

## 2013-04-09 NOTE — Telephone Encounter (Signed)
THIS REFILL REQUEST FOR REVLIMID WAS PLACED IN DR.HA'S ACTIVE WORK FOLDER. 

## 2013-04-10 ENCOUNTER — Other Ambulatory Visit: Payer: Self-pay | Admitting: *Deleted

## 2013-04-15 ENCOUNTER — Encounter: Payer: Self-pay | Admitting: Oncology

## 2013-04-16 ENCOUNTER — Other Ambulatory Visit: Payer: Self-pay | Admitting: Licensed Clinical Social Worker

## 2013-04-16 ENCOUNTER — Encounter: Payer: Self-pay | Admitting: Infectious Disease

## 2013-04-16 DIAGNOSIS — B20 Human immunodeficiency virus [HIV] disease: Secondary | ICD-10-CM

## 2013-04-16 MED ORDER — SODIUM BICARBONATE 650 MG PO TABS: 650.0000 mg | ORAL_TABLET | Freq: Three times a day (TID) | ORAL | Status: DC

## 2013-04-17 ENCOUNTER — Other Ambulatory Visit: Payer: Self-pay | Admitting: *Deleted

## 2013-04-17 ENCOUNTER — Other Ambulatory Visit (HOSPITAL_BASED_OUTPATIENT_CLINIC_OR_DEPARTMENT_OTHER): Payer: No Typology Code available for payment source | Admitting: Lab

## 2013-04-17 ENCOUNTER — Ambulatory Visit (HOSPITAL_BASED_OUTPATIENT_CLINIC_OR_DEPARTMENT_OTHER): Payer: No Typology Code available for payment source | Admitting: Oncology

## 2013-04-17 ENCOUNTER — Telehealth: Payer: Self-pay | Admitting: *Deleted

## 2013-04-17 VITALS — BP 144/88 | HR 77 | Temp 98.0°F | Ht 64.0 in | Wt 156.4 lb

## 2013-04-17 DIAGNOSIS — D4989 Neoplasm of unspecified behavior of other specified sites: Secondary | ICD-10-CM

## 2013-04-17 DIAGNOSIS — Z452 Encounter for adjustment and management of vascular access device: Secondary | ICD-10-CM

## 2013-04-17 DIAGNOSIS — C9000 Multiple myeloma not having achieved remission: Secondary | ICD-10-CM

## 2013-04-17 LAB — COMPREHENSIVE METABOLIC PANEL (CC13)
AST: 23 U/L (ref 5–34)
Albumin: 3.2 g/dL — ABNORMAL LOW (ref 3.5–5.0)
Alkaline Phosphatase: 189 U/L — ABNORMAL HIGH (ref 40–150)
BUN: 21 mg/dL (ref 7.0–26.0)
Creatinine: 1.2 mg/dL — ABNORMAL HIGH (ref 0.6–1.1)
Glucose: 113 mg/dl (ref 70–140)
Total Bilirubin: <0.2 mg/dL (ref 0.20–1.20)

## 2013-04-17 LAB — PROTHROMBIN TIME
INR: 1.63 — ABNORMAL HIGH (ref <1.50)
Prothrombin Time: 18.9 seconds — ABNORMAL HIGH (ref 11.6–15.2)

## 2013-04-17 LAB — CBC WITH DIFFERENTIAL/PLATELET
BASO%: 1.1 % (ref 0.0–2.0)
EOS%: 12.5 % — ABNORMAL HIGH (ref 0.0–7.0)
HCT: 30.2 % — ABNORMAL LOW (ref 34.8–46.6)
LYMPH%: 42.8 % (ref 14.0–49.7)
MCH: 34.4 pg — ABNORMAL HIGH (ref 25.1–34.0)
MCHC: 34 g/dL (ref 31.5–36.0)
MONO#: 0.6 10*3/uL (ref 0.1–0.9)
NEUT%: 31.4 % — ABNORMAL LOW (ref 38.4–76.8)
Platelets: 321 10*3/uL (ref 145–400)
RBC: 2.99 10*6/uL — ABNORMAL LOW (ref 3.70–5.45)
WBC: 5.1 10*3/uL (ref 3.9–10.3)

## 2013-04-17 MED ORDER — HEPARIN SOD (PORK) LOCK FLUSH 100 UNIT/ML IV SOLN
500.0000 [IU] | Freq: Once | INTRAVENOUS | Status: AC
  Administered 2013-04-17: 500 [IU] via INTRAVENOUS
  Filled 2013-04-17: qty 5

## 2013-04-17 MED ORDER — WARFARIN SODIUM 5 MG PO TABS: 7.5000 mg | ORAL_TABLET | Freq: Every day | ORAL | Status: DC

## 2013-04-17 MED ORDER — SODIUM CHLORIDE 0.9 % IJ SOLN
10.0000 mL | INTRAMUSCULAR | Status: DC | PRN
  Administered 2013-04-17: 10 mL via INTRAVENOUS
  Filled 2013-04-17: qty 10

## 2013-04-17 MED ORDER — LENALIDOMIDE 15 MG PO CAPS: 15.0000 mg | ORAL_CAPSULE | ORAL | Status: DC

## 2013-04-17 NOTE — Telephone Encounter (Signed)
Per staff message and POF I have scheduled appts.  JMW  

## 2013-04-17 NOTE — Telephone Encounter (Signed)
Gave pt appt for lab, and MD emailed Marcelino Duster regarding chemo for July and August

## 2013-04-17 NOTE — Telephone Encounter (Signed)
Message copied by Wende Mott on Wed Apr 17, 2013  5:04 PM ------      Message from: HA, Raliegh Ip T      Created: Wed Apr 17, 2013  3:32 PM       Please call pt and advise her to change her Coumadin dose.            She is doing 7.5mg  PO daily now.  Her INR is still subtherapeutic.        New dose:      7.5mg  PO Mon/Wed/Fri/Sun; 10mg  PO Tue/Thu/Sat.             Thanks. ------

## 2013-04-17 NOTE — Telephone Encounter (Signed)
Informed pt of INR result and Dr. Lodema Pilot instructions below.  She verbalized understanding and states needs refill sent to [facility] pharmacy.  Rx sent.

## 2013-04-18 ENCOUNTER — Telehealth: Payer: Self-pay | Admitting: Oncology

## 2013-04-18 NOTE — Progress Notes (Signed)
[facility]  Telephone:([phone] Fax:(336) [phone]     HEMATOLOGY - OFFICE PROGRESS NOTE    DIAGNOSIS: IgG kappa multiple myeloma; presented with anemia, acute renal failure. Initial M spike was 5.15 g/dL; IgG 7040 mg/dL; positive Bence Jones protein. Bone marrow biopsy on 01/11/2013  showed 40% plasma cell. Cytogenetics and myeloma FISH panel are pending.   CURRENT THERAPY:  Velcade/Dex started in April 2014.  Revlimid was added to the regimen when her renal function stabilized, and Zometa after she had dental clearance in May 2014.   INTERVAL HISTORY: Ms. Jennifer Boyle returned to clinic for follow up by herself.  She has mild bilateral back pain.  Pain is worst with heavy exertion.  She is independent of all activities of daily living.  She does not need to take pain medication a routine basis except for occasional Tylenol. She denies lower than it weakness, paresthesia, bowel bladder incontinence. The rest of the 14 point review of system was negative.   Past Medical History  Diagnosis Date  . Pneumonia 10/2002; 2010    Required INTUBATION for streptococcal pneumonia/septicemia (10/2002)  . HIV disease DX: 01/2002  . Fibroid uterus   . 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 Peripheral neuropathy     resolved.   . Multiple myeloma 01/2013    01/11/2013 cytogenetics showed 59, XX, +2, +4, +5, +7, add(7)(p22), +, +9, +11, der(11) t(1;11)(q11; q24), +12, +15, -16, +17, +19, +20, +mar1, +mar2[cp6] / 46, XX [17].   Myeloma FISH showed presence of +11, and +17.   :    Past Surgical History  Procedure Laterality Date  . No past surgeries    :   CURRENT MEDS: Current  Outpatient Prescriptions  Medication Sig Dispense Refill  . abacavir (ZIAGEN) 300 MG tablet Take 2 tablets (600 mg total) by mouth daily.  60 tablet  11  . acyclovir (ZOVIRAX) 400 MG tablet Take 0.5 tablets (200 mg total) by mouth daily.  60 tablet  3  . dexamethasone (DECADRON) 4 MG tablet Take 10 tablets (40 mg total) by mouth once a week.  200 tablet  2  . Dolutegravir Sodium 50 MG TABS Take 50 mg by mouth 2 (two) times daily.       [name] lamivudine (EPIVIR) 100 MG tablet Take 1 tablet (100 mg total) by mouth daily.  30 tablet  11  . lidocaine-prilocaine (EMLA) cream Apply topically as needed. Apply to portacath one hour before chemo access or blood draw.  30 g  3  . prochlorperazine (COMPAZINE) 10 MG tablet Take 1 tablet (10 mg total) by mouth every 6 (six) hours as needed (Nausea or vomiting).  30 tablet  1  . promethazine (PHENERGAN) 25 MG tablet Take 1 tablet (25 mg total) by mouth every 6 (six) hours as needed for nausea.  30 tablet  0  . sodium bicarbonate 650 MG tablet Take 1 tablet (650 mg total) by mouth 3 (three) times daily.  90 tablet  3  . lenalidomide (REVLIMID) 15 MG capsule Take 1 capsule (15 mg total) by mouth every other day. Rest 7 days and repeat  21 capsule  0  . warfarin (COUMADIN) 5  MG tablet Take 1.5 tablets (7.5 mg total) by mouth daily. 7.5 mg on Mon, Wed, Fri & Sun. 10 mg on Tues, Thurs & Sat.  Or as Directed by MD.  48 tablet  3   Current Facility-Administered Medications  Medication Dose Route Frequency Provider Last Rate Last Dose  . sodium chloride 0.9 % injection 10 mL  10 mL Intravenous PRN Exie Parody, MD   10 mL at 04/17/13 1412      Allergies  Allergen Reactions  . Aspirin     Upset stomach   . Efavirenz Hives and Rash    (brand name sustiva)  :  Family History  Problem Relation Age of Onset  . Hypertension Mother   :  History   Social History  . Marital Status: Single    Spouse Name: N/A    Number of Children: 0  . Years of Education: N/A    Occupational History  .      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    Social History Main Topics  . Smoking status: Never Smoker   . Smokeless tobacco: Never Used  . Alcohol Use: No  . Drug Use: No  . Sexually Active: Not Currently   Other Topics Concern  . Not on file   Social History Narrative   The patient is single. The patient has no children.   The patient is originally from [place] then moved to the [place] approximately 25 years ago.   The patient works as a Tour manager Banker).   Patient has never smoked. The patient does not use alcohol.    The patient has never used smokeless tobacco. The patient denies IV drug abuse.  :  REVIEW OF SYSTEM:  The rest of the 14-point review of sytem was negative.   Exam: ECOG 0-1  General:  well-nourished  woman,in no acute distress.  Eyes:  no scleral icterus.  ENT:  There were no oropharyngeal lesions.  Neck was without thyromegaly.  Lymphatics:  Negative cervical, supraclavicular or axillary adenopathy.  Respiratory: lungs were clear bilaterally without wheezing or crackles.  Cardiovascular:  Regular rate and rhythm, S1/S2, without murmur, rub or gallop.  There was no pedal edema.  GI:  abdomen was soft, flat, nontender, nondistended, without organomegaly.  Muscoloskeletal:  no spinal tenderness of palpation of vertebral spine.  Skin exam was without echymosis, petichae.  Neuro exam was nonfocal.  Patient was able to get on and off exam table without assistance.  Gait was normal.  Patient was alerted and oriented.  Attention was good.   Language was appropriate.  Mood was normal without depression.  Speech was not pressured.  Thought content was not tangential.    LABS:  Lab Results  Component Value Date   WBC 5.1 04/17/2013   HGB 10.3* 04/17/2013   HCT 30.2* 04/17/2013   PLT 321 04/17/2013   GLUCOSE 113 04/17/2013   CHOL 174 07/31/2012   TRIG 78 07/31/2012   HDL 40 07/31/2012   LDLCALC 118* 07/31/2012   ALT 38 04/17/2013   AST 23  04/17/2013   NA 140 04/17/2013   K 3.8 04/17/2013   CL 111* 03/29/2013   CREATININE 1.2* 04/17/2013   BUN 21.0 04/17/2013   CO2 23 04/17/2013   INR 1.63* 04/17/2013   MICROALBUR 48.81* 12/20/2012    ASSESSMENT AND PLAN:    1.  Multiple myeloma:  - continue to have good response now on Revlimid/Dex/Velcade.  - As her renal function has  significantly improved, I increased her Revlimid to 15mg  PO daily. If her renal function continues to improve, we may consider increasing the dose of her to 20 or 25 mg by mouth daily with future cycles. - I recommend her to increase her Coumadin dose to 10 mg on Monday, Wednesday, Friday, and Sunday. The rest of the week, she can take 7.5 mg by mouth daily. - To prevent risk of herpes reactivation, I advise her to continue Acyclovir.     2.  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 She has not had side effect.   3.  Long term plan:  - She applying for ensure that she can go to transplant Center to be evaluated for the role of transplant. If she does not have insurance or qualify for transplant, we may consider 8-12 months total of chemotherapy with Velcade, Revlimid, dexamethasone and then start maintenance therapy with Revlimid if she has good response to induction chemotherapy.  4.  HIV/AIDS:  On abacavir, Dolutegravir, Lamuvidine per ID.   5.  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  Cr has significantly improved.   6.  Anemia:  Due to myeloma, renal insufficiency, chemo treatment.  She has no active bleeding nor severe symptoms of anemia.  We will continue to check weekly CBC and consider transfusion when Hgb <7.   7.  Follow up:  Weekly lab and chemo.  Return visit in about 1 month.     The length of time of the face-to-face encounter was 25 minutes. More than 50% of time was spent counseling and coordination of care.     Huan T. Gaylyn Rong, M.D.

## 2013-04-18 NOTE — Telephone Encounter (Signed)
Talked to pt and gave her appt for 7/11 and rest of July 2014, lab , MD and chemo

## 2013-04-19 ENCOUNTER — Other Ambulatory Visit: Payer: Self-pay | Admitting: Lab

## 2013-04-19 ENCOUNTER — Other Ambulatory Visit: Payer: Self-pay | Admitting: Oncology

## 2013-04-19 ENCOUNTER — Ambulatory Visit (HOSPITAL_BASED_OUTPATIENT_CLINIC_OR_DEPARTMENT_OTHER): Payer: No Typology Code available for payment source

## 2013-04-19 VITALS — BP 133/92 | HR 80 | Temp 98.1°F

## 2013-04-19 DIAGNOSIS — D4989 Neoplasm of unspecified behavior of other specified sites: Secondary | ICD-10-CM

## 2013-04-19 DIAGNOSIS — Z5112 Encounter for antineoplastic immunotherapy: Secondary | ICD-10-CM

## 2013-04-19 DIAGNOSIS — C9000 Multiple myeloma not having achieved remission: Secondary | ICD-10-CM

## 2013-04-19 LAB — PROTEIN ELECTROPHORESIS, SERUM
Gamma Globulin: 17.6 % (ref 11.1–18.8)
M-Spike, %: 0.93 g/dL
Total Protein, Serum Electrophoresis: 6.4 g/dL (ref 6.0–8.3)

## 2013-04-19 LAB — KAPPA/LAMBDA LIGHT CHAINS
Kappa free light chain: 289 mg/dL — ABNORMAL HIGH (ref 0.33–1.94)
Lambda Free Lght Chn: 1.02 mg/dL (ref 0.57–2.63)

## 2013-04-19 MED ORDER — BORTEZOMIB CHEMO SQ INJECTION 3.5 MG (2.5MG/ML)
1.3000 mg/m2 | Freq: Once | INTRAMUSCULAR | Status: AC
  Administered 2013-04-19: 2.25 mg via SUBCUTANEOUS
  Filled 2013-04-19: qty 2.25

## 2013-04-19 MED ORDER — ZOLEDRONIC ACID 4 MG/5ML IV CONC
3.0000 mg | Freq: Once | INTRAVENOUS | Status: AC
  Administered 2013-04-19: 3 mg via INTRAVENOUS
  Filled 2013-04-19: qty 3.75

## 2013-04-19 MED ORDER — ONDANSETRON HCL 8 MG PO TABS
8.0000 mg | ORAL_TABLET | Freq: Once | ORAL | Status: AC
  Administered 2013-04-19: 8 mg via ORAL

## 2013-04-19 NOTE — Patient Instructions (Addendum)
[facility] Discharge Instructions for Patients Receiving Chemotherapy  Today you received the following chemotherapy agents: Velcade, Zometa.  To help prevent nausea and vomiting after your treatment, we encourage you to take your nausea medication.   If you develop nausea and vomiting that is not controlled by your nausea medication, call the clinic.   BELOW ARE SYMPTOMS THAT SHOULD BE REPORTED IMMEDIATELY:  *FEVER GREATER THAN 100.5 F  *CHILLS WITH OR WITHOUT FEVER  NAUSEA AND VOMITING THAT IS NOT CONTROLLED WITH YOUR NAUSEA MEDICATION  *UNUSUAL SHORTNESS OF BREATH  *UNUSUAL BRUISING OR BLEEDING  TENDERNESS IN MOUTH AND THROAT WITH OR WITHOUT PRESENCE OF ULCERS  *URINARY PROBLEMS  *BOWEL PROBLEMS  UNUSUAL RASH Items with * indicate a potential emergency and should be followed up as soon as possible.  Feel free to call the clinic you have any questions or concerns. The clinic phone number is [phone].

## 2013-04-22 ENCOUNTER — Encounter: Payer: Self-pay | Admitting: Oncology

## 2013-04-23 ENCOUNTER — Other Ambulatory Visit: Payer: Self-pay | Admitting: *Deleted

## 2013-04-23 NOTE — Progress Notes (Signed)
Patient refuses to be stuck in arm peripherally for pre-treatment labs, requesting flush appts to have labs drawn. Orders sent to scheduling.

## 2013-04-24 ENCOUNTER — Telehealth: Payer: Self-pay | Admitting: Oncology

## 2013-04-24 NOTE — Telephone Encounter (Signed)
s.w. pt and advised on 7.18.14 appt....pt ok and aware

## 2013-04-25 ENCOUNTER — Encounter: Payer: Self-pay | Admitting: *Deleted

## 2013-04-25 NOTE — Progress Notes (Signed)
Fax received from [facility], they shipped Revlimid to pt on 7/10 for delivery on 7/11.  

## 2013-04-26 ENCOUNTER — Telehealth: Payer: Self-pay | Admitting: *Deleted

## 2013-04-26 ENCOUNTER — Ambulatory Visit: Payer: Self-pay

## 2013-04-26 ENCOUNTER — Other Ambulatory Visit: Payer: Self-pay | Admitting: Lab

## 2013-04-26 ENCOUNTER — Ambulatory Visit (HOSPITAL_BASED_OUTPATIENT_CLINIC_OR_DEPARTMENT_OTHER): Payer: No Typology Code available for payment source

## 2013-04-26 ENCOUNTER — Other Ambulatory Visit: Payer: Self-pay

## 2013-04-26 ENCOUNTER — Other Ambulatory Visit (HOSPITAL_BASED_OUTPATIENT_CLINIC_OR_DEPARTMENT_OTHER): Payer: No Typology Code available for payment source | Admitting: Lab

## 2013-04-26 VITALS — BP 145/90 | HR 70 | Temp 97.2°F

## 2013-04-26 DIAGNOSIS — C9000 Multiple myeloma not having achieved remission: Secondary | ICD-10-CM

## 2013-04-26 DIAGNOSIS — Z5112 Encounter for antineoplastic immunotherapy: Secondary | ICD-10-CM

## 2013-04-26 DIAGNOSIS — D4989 Neoplasm of unspecified behavior of other specified sites: Secondary | ICD-10-CM

## 2013-04-26 LAB — CBC WITH DIFFERENTIAL/PLATELET
Basophils Absolute: 0 10*3/uL (ref 0.0–0.1)
EOS%: 5.4 % (ref 0.0–7.0)
HGB: 10 g/dL — ABNORMAL LOW (ref 11.6–15.9)
MCH: 34.5 pg — ABNORMAL HIGH (ref 25.1–34.0)
MCV: 101.2 fL — ABNORMAL HIGH (ref 79.5–101.0)
MONO%: 7.7 % (ref 0.0–14.0)
RBC: 2.89 10*6/uL — ABNORMAL LOW (ref 3.70–5.45)
RDW: 14.7 % — ABNORMAL HIGH (ref 11.2–14.5)

## 2013-04-26 LAB — BASIC METABOLIC PANEL (CC13)
BUN: 14.9 mg/dL (ref 7.0–26.0)
Calcium: 8.8 mg/dL (ref 8.4–10.4)
Potassium: 4.3 mEq/L (ref 3.5–5.1)

## 2013-04-26 LAB — PROTHROMBIN TIME
INR: 2.31 — ABNORMAL HIGH (ref <1.50)
Prothrombin Time: 24.6 seconds — ABNORMAL HIGH (ref 11.6–15.2)

## 2013-04-26 MED ORDER — SODIUM CHLORIDE 0.9 % IJ SOLN
10.0000 mL | INTRAMUSCULAR | Status: DC | PRN
  Administered 2013-04-26: 10 mL via INTRAVENOUS
  Filled 2013-04-26: qty 10

## 2013-04-26 MED ORDER — HEPARIN SOD (PORK) LOCK FLUSH 100 UNIT/ML IV SOLN
500.0000 [IU] | Freq: Once | INTRAVENOUS | Status: AC
  Administered 2013-04-26: 500 [IU] via INTRAVENOUS
  Filled 2013-04-26: qty 5

## 2013-04-26 MED ORDER — ONDANSETRON HCL 8 MG PO TABS
8.0000 mg | ORAL_TABLET | Freq: Once | ORAL | Status: AC
  Administered 2013-04-26: 8 mg via ORAL

## 2013-04-26 MED ORDER — BORTEZOMIB CHEMO SQ INJECTION 3.5 MG (2.5MG/ML)
1.3000 mg/m2 | Freq: Once | INTRAMUSCULAR | Status: AC
  Administered 2013-04-26: 2.25 mg via SUBCUTANEOUS
  Filled 2013-04-26: qty 2.25

## 2013-04-26 NOTE — Telephone Encounter (Signed)
Message copied by Wende Mott on Fri Apr 26, 2013  1:56 PM ------      Message from: HA, Raliegh Ip T      Created: Fri Apr 26, 2013  1:21 PM       Same dose of Coumadin. ------

## 2013-04-26 NOTE — Patient Instructions (Addendum)
[facility] Discharge Instructions for Patients Receiving Chemotherapy  Today you received the following chemotherapy agents: Velcade.  To help prevent nausea and vomiting after your treatment, we encourage you to take your nausea medication as prescribed.   If you develop nausea and vomiting that is not controlled by your nausea medication, call the clinic.   BELOW ARE SYMPTOMS THAT SHOULD BE REPORTED IMMEDIATELY:  *FEVER GREATER THAN 100.5 F  *CHILLS WITH OR WITHOUT FEVER  NAUSEA AND VOMITING THAT IS NOT CONTROLLED WITH YOUR NAUSEA MEDICATION  *UNUSUAL SHORTNESS OF BREATH  *UNUSUAL BRUISING OR BLEEDING  TENDERNESS IN MOUTH AND THROAT WITH OR WITHOUT PRESENCE OF ULCERS  *URINARY PROBLEMS  *BOWEL PROBLEMS  UNUSUAL RASH Items with * indicate a potential emergency and should be followed up as soon as possible.  Feel free to call the clinic you have any questions or concerns. The clinic phone number is [phone].    

## 2013-04-26 NOTE — Telephone Encounter (Signed)
S/w pt in infusion room.  Informed of INR therapeutic and continue same dose of coumadin per Dr. Gaylyn Rong.  She confirms current dose is 7.5 mg daily except 10 mg on Tue, Thur and Sat.

## 2013-05-01 ENCOUNTER — Telehealth: Payer: Self-pay | Admitting: *Deleted

## 2013-05-01 ENCOUNTER — Encounter: Payer: Self-pay | Admitting: Infectious Disease

## 2013-05-01 NOTE — Telephone Encounter (Signed)
Pt will come in tomorrow 05/02/13 for labs. Pt declined to be scheduled for follow up with another provider, stating that Dr. Daiva Eves would contact her if there was anything concerning in her labs.  She agreed to an appointment with Dr. Daiva Eves in early September, just to "check in." Andree Coss, RN

## 2013-05-02 ENCOUNTER — Other Ambulatory Visit: Payer: Self-pay | Admitting: *Deleted

## 2013-05-02 ENCOUNTER — Other Ambulatory Visit (INDEPENDENT_AMBULATORY_CARE_PROVIDER_SITE_OTHER): Payer: No Typology Code available for payment source

## 2013-05-02 DIAGNOSIS — B20 Human immunodeficiency virus [HIV] disease: Secondary | ICD-10-CM

## 2013-05-02 NOTE — Telephone Encounter (Signed)
Ok that is fine, well work her in if labs are out of [employer]

## 2013-05-02 NOTE — Telephone Encounter (Signed)
I'll put in the orders now.  Thank you.

## 2013-05-02 NOTE — Telephone Encounter (Signed)
VIRAL LOAD, CD4, CMP, CBC C DIFF, IS FINE

## 2013-05-03 ENCOUNTER — Telehealth: Payer: Self-pay | Admitting: Infectious Disease

## 2013-05-03 ENCOUNTER — Other Ambulatory Visit: Payer: Self-pay | Admitting: Lab

## 2013-05-03 ENCOUNTER — Ambulatory Visit (HOSPITAL_BASED_OUTPATIENT_CLINIC_OR_DEPARTMENT_OTHER): Payer: No Typology Code available for payment source

## 2013-05-03 ENCOUNTER — Telehealth: Payer: Self-pay | Admitting: *Deleted

## 2013-05-03 ENCOUNTER — Other Ambulatory Visit (HOSPITAL_BASED_OUTPATIENT_CLINIC_OR_DEPARTMENT_OTHER): Payer: No Typology Code available for payment source

## 2013-05-03 VITALS — BP 142/89 | HR 70 | Temp 97.8°F

## 2013-05-03 DIAGNOSIS — D4989 Neoplasm of unspecified behavior of other specified sites: Secondary | ICD-10-CM

## 2013-05-03 DIAGNOSIS — Z5112 Encounter for antineoplastic immunotherapy: Secondary | ICD-10-CM

## 2013-05-03 DIAGNOSIS — C9000 Multiple myeloma not having achieved remission: Secondary | ICD-10-CM

## 2013-05-03 LAB — CBC WITH DIFFERENTIAL/PLATELET
Basophils Absolute: 0 10*3/uL (ref 0.0–0.1)
Eosinophils Absolute: 0.1 10*3/uL (ref 0.0–0.5)
HGB: 10.2 g/dL — ABNORMAL LOW (ref 11.6–15.9)
MCV: 100 fL (ref 79.5–101.0)
MONO%: 5.1 % (ref 0.0–14.0)
NEUT#: 5.7 10*3/uL (ref 1.5–6.5)
RBC: 3 10*6/uL — ABNORMAL LOW (ref 3.70–5.45)
RDW: 13.9 % (ref 11.2–14.5)
WBC: 7.5 10*3/uL (ref 3.9–10.3)
lymph#: 1.3 10*3/uL (ref 0.9–3.3)

## 2013-05-03 LAB — PROTHROMBIN TIME
INR: 3.2 — ABNORMAL HIGH (ref <1.50)
Prothrombin Time: 31.6 seconds — ABNORMAL HIGH (ref 11.6–15.2)

## 2013-05-03 LAB — BASIC METABOLIC PANEL (CC13)
Chloride: 111 mEq/L — ABNORMAL HIGH (ref 98–109)
Glucose: 104 mg/dl (ref 70–140)
Potassium: 4.1 mEq/L (ref 3.5–5.1)
Sodium: 141 mEq/L (ref 136–145)

## 2013-05-03 LAB — COMPLETE METABOLIC PANEL WITH GFR
Albumin: 3.5 g/dL (ref 3.5–5.2)
CO2: 22 mEq/L (ref 19–32)
GFR, Est African American: 77 mL/min
GFR, Est Non African American: 67 mL/min
Glucose, Bld: 73 mg/dL (ref 70–99)
Potassium: 4.1 mEq/L (ref 3.5–5.3)
Sodium: 139 mEq/L (ref 135–145)
Total Protein: 6.1 g/dL (ref 6.0–8.3)

## 2013-05-03 MED ORDER — HEPARIN SOD (PORK) LOCK FLUSH 100 UNIT/ML IV SOLN
500.0000 [IU] | Freq: Once | INTRAVENOUS | Status: AC
  Administered 2013-05-03: 500 [IU] via INTRAVENOUS
  Filled 2013-05-03: qty 5

## 2013-05-03 MED ORDER — ABACAVIR SULFATE-LAMIVUDINE 600-300 MG PO TABS: 1.0000 | ORAL_TABLET | Freq: Every day | ORAL | Status: DC

## 2013-05-03 MED ORDER — SODIUM CHLORIDE 0.9 % IJ SOLN
10.0000 mL | INTRAMUSCULAR | Status: DC | PRN
  Administered 2013-05-03: 10 mL via INTRAVENOUS
  Filled 2013-05-03: qty 10

## 2013-05-03 MED ORDER — DOLUTEGRAVIR SODIUM 50 MG PO TABS: 50.0000 mg | ORAL_TABLET | Freq: Every day | ORAL | Status: DC

## 2013-05-03 MED ORDER — BORTEZOMIB CHEMO SQ INJECTION 3.5 MG (2.5MG/ML)
1.3000 mg/m2 | Freq: Once | INTRAMUSCULAR | Status: AC
  Administered 2013-05-03: 2.25 mg via SUBCUTANEOUS
  Filled 2013-05-03: qty 2.25

## 2013-05-03 MED ORDER — ONDANSETRON HCL 8 MG PO TABS
8.0000 mg | ORAL_TABLET | Freq: Once | ORAL | Status: AC
  Administered 2013-05-03: 8 mg via ORAL

## 2013-05-03 NOTE — Patient Instructions (Addendum)
[facility] Discharge Instructions for Patients Receiving Chemotherapy  Today you received the following chemotherapy agents: Velcade.  To help prevent nausea and vomiting after your treatment, we encourage you to take your nausea medication as directed by your physician.   If you develop nausea and vomiting that is not controlled by your nausea medication, call the clinic.   BELOW ARE SYMPTOMS THAT SHOULD BE REPORTED IMMEDIATELY:  *FEVER GREATER THAN 100.5 F  *CHILLS WITH OR WITHOUT FEVER  NAUSEA AND VOMITING THAT IS NOT CONTROLLED WITH YOUR NAUSEA MEDICATION  *UNUSUAL SHORTNESS OF BREATH  *UNUSUAL BRUISING OR BLEEDING  TENDERNESS IN MOUTH AND THROAT WITH OR WITHOUT PRESENCE OF ULCERS  *URINARY PROBLEMS  *BOWEL PROBLEMS  UNUSUAL RASH Items with * indicate a potential emergency and should be followed up as soon as possible.  Feel free to call the clinic you have any questions or concerns. The clinic phone number is [phone].

## 2013-05-03 NOTE — Telephone Encounter (Signed)
Message copied by Wende Mott on Fri May 03, 2013  4:22 PM ------      Message from: Myrtis Ser      Created: Fri May 03, 2013  4:15 PM       Please call pt. Change Coumadin to 10 mg T & Th and 7.5 mg on M, W, F, S, S. ------

## 2013-05-03 NOTE — Telephone Encounter (Signed)
I called Jennifer Boyle today because her renal fxn is NORMAL and she has been consequently getting UNDER dosed with EPIVIR.  I have asked her for now to increase the EPIVIR to 3 pills- 300mg  per day,  with continued ZIAGEn 300mg  x 2 == 600mg  daily.  She also can drop her TIVICAY to 50mg  per day  I am sending NEW rx for EPzicom once daily  Plus   TIivicay once daily which will be a combination version of her current regimen in combination form  Can we call [name]  Early next week to make sur she has the meds and if poossble have her see eithe rID clinic MD or pharmacist to review meds

## 2013-05-03 NOTE — Telephone Encounter (Signed)
Left VM for pt informing of Jennifer Boyle's message below.  Asked her to return call if any questions.

## 2013-05-05 ENCOUNTER — Encounter: Payer: Self-pay | Admitting: Infectious Disease

## 2013-05-05 ENCOUNTER — Encounter: Payer: Self-pay | Admitting: Oncology

## 2013-05-06 NOTE — Telephone Encounter (Signed)
I spoke with Tonja, confirming her new regimen.  She declined an appointment at [facility], stating that she felt that she had a handle on her medication and didn't need to review it with anyone at this time.  Pt confirmed that her new prescriptions were sent to the pharmacy.  She is requesting that her labs be released to MyChart so that she can review them.  Andree Coss, RN

## 2013-05-07 NOTE — Telephone Encounter (Signed)
They should be released, I signed them

## 2013-05-09 ENCOUNTER — Telehealth: Payer: Self-pay | Admitting: *Deleted

## 2013-05-09 NOTE — Telephone Encounter (Signed)
Excellent

## 2013-05-09 NOTE — Telephone Encounter (Signed)
Pt verbalized that she understands that the Epzicom and Tivicay are taken daily.  Pt requested that [facility] be called to discontinue the individual abacavir and lamivudine tablets from the medication profile.  RN stated that [facility] would be called.  Call to [place], [place], [place] to d/c abacavir and lamivudine individual tablets from patient medication profile.  Pharmacist confirmed the removal.

## 2013-05-10 ENCOUNTER — Other Ambulatory Visit: Payer: Self-pay

## 2013-05-10 ENCOUNTER — Telehealth: Payer: Self-pay | Admitting: *Deleted

## 2013-05-10 ENCOUNTER — Encounter: Payer: Self-pay | Admitting: Oncology

## 2013-05-10 ENCOUNTER — Other Ambulatory Visit: Payer: Self-pay | Admitting: Lab

## 2013-05-10 DIAGNOSIS — D4989 Neoplasm of unspecified behavior of other specified sites: Secondary | ICD-10-CM

## 2013-05-10 NOTE — Telephone Encounter (Signed)
THIS REFILL REQUEST FOR REVLIMID WAS GIVEN TO DR.ROSTAPSHOV'S NURSE, CAMEO PORTER,RN. 

## 2013-05-13 ENCOUNTER — Other Ambulatory Visit: Payer: Self-pay

## 2013-05-13 ENCOUNTER — Ambulatory Visit: Payer: Self-pay | Admitting: Lab

## 2013-05-13 DIAGNOSIS — C9000 Multiple myeloma not having achieved remission: Secondary | ICD-10-CM

## 2013-05-13 LAB — PREGNANCY, URINE: Preg Test, Ur: NEGATIVE

## 2013-05-15 MED ORDER — LENALIDOMIDE 15 MG PO CAPS: 15.0000 mg | ORAL_CAPSULE | Freq: Every day | ORAL | Status: DC

## 2013-05-15 NOTE — Telephone Encounter (Signed)
Patient called and reminded of Revlimid REMS guidelines regarding contraception and blood donation. Patient verbalized understanding. If Rx not filled by Monday 8/11, pregnancy test needs to be repeated and another authorization number obtained.

## 2013-05-15 NOTE — Addendum Note (Signed)
Addended by: Laroy Apple E on: 05/15/2013 02:39 PM   Modules accepted: Orders

## 2013-05-17 ENCOUNTER — Ambulatory Visit: Payer: Self-pay

## 2013-05-17 ENCOUNTER — Other Ambulatory Visit: Payer: Self-pay | Admitting: Lab

## 2013-05-17 ENCOUNTER — Ambulatory Visit (HOSPITAL_BASED_OUTPATIENT_CLINIC_OR_DEPARTMENT_OTHER): Payer: No Typology Code available for payment source

## 2013-05-17 ENCOUNTER — Encounter: Payer: Self-pay | Admitting: Oncology

## 2013-05-17 ENCOUNTER — Other Ambulatory Visit (HOSPITAL_BASED_OUTPATIENT_CLINIC_OR_DEPARTMENT_OTHER): Payer: No Typology Code available for payment source | Admitting: Lab

## 2013-05-17 ENCOUNTER — Ambulatory Visit (HOSPITAL_BASED_OUTPATIENT_CLINIC_OR_DEPARTMENT_OTHER): Payer: No Typology Code available for payment source | Admitting: Oncology

## 2013-05-17 VITALS — BP 155/87 | HR 77 | Temp 98.1°F | Resp 20 | Ht 64.0 in | Wt 163.2 lb

## 2013-05-17 DIAGNOSIS — C9000 Multiple myeloma not having achieved remission: Secondary | ICD-10-CM

## 2013-05-17 DIAGNOSIS — Z5112 Encounter for antineoplastic immunotherapy: Secondary | ICD-10-CM

## 2013-05-17 DIAGNOSIS — N189 Chronic kidney disease, unspecified: Secondary | ICD-10-CM

## 2013-05-17 DIAGNOSIS — D4989 Neoplasm of unspecified behavior of other specified sites: Secondary | ICD-10-CM

## 2013-05-17 DIAGNOSIS — B20 Human immunodeficiency virus [HIV] disease: Secondary | ICD-10-CM

## 2013-05-17 DIAGNOSIS — D630 Anemia in neoplastic disease: Secondary | ICD-10-CM

## 2013-05-17 DIAGNOSIS — N039 Chronic nephritic syndrome with unspecified morphologic changes: Secondary | ICD-10-CM

## 2013-05-17 LAB — CBC WITH DIFFERENTIAL/PLATELET
Basophils Absolute: 0.1 10*3/uL (ref 0.0–0.1)
EOS%: 3.9 % (ref 0.0–7.0)
HGB: 10.1 g/dL — ABNORMAL LOW (ref 11.6–15.9)
MCH: 33.7 pg (ref 25.1–34.0)
NEUT#: 2.6 10*3/uL (ref 1.5–6.5)
RBC: 2.99 10*6/uL — ABNORMAL LOW (ref 3.70–5.45)
RDW: 14.4 % (ref 11.2–14.5)
lymph#: 1.6 10*3/uL (ref 0.9–3.3)

## 2013-05-17 LAB — PROTHROMBIN TIME
INR: 3.19 — ABNORMAL HIGH (ref <1.50)
Prothrombin Time: 31.5 seconds — ABNORMAL HIGH (ref 11.6–15.2)

## 2013-05-17 MED ORDER — HEPARIN SOD (PORK) LOCK FLUSH 100 UNIT/ML IV SOLN
500.0000 [IU] | Freq: Once | INTRAVENOUS | Status: DC
  Filled 2013-05-17: qty 5

## 2013-05-17 MED ORDER — SODIUM CHLORIDE 0.9 % IJ SOLN
10.0000 mL | INTRAMUSCULAR | Status: DC | PRN
  Filled 2013-05-17: qty 10

## 2013-05-17 MED ORDER — BORTEZOMIB CHEMO SQ INJECTION 3.5 MG (2.5MG/ML)
1.3000 mg/m2 | Freq: Once | INTRAMUSCULAR | Status: AC
  Administered 2013-05-17: 2.25 mg via SUBCUTANEOUS
  Filled 2013-05-17: qty 2.25

## 2013-05-17 MED ORDER — ONDANSETRON HCL 8 MG PO TABS
8.0000 mg | ORAL_TABLET | Freq: Once | ORAL | Status: AC
  Administered 2013-05-17: 8 mg via ORAL

## 2013-05-17 NOTE — Progress Notes (Signed)
Per Pharmacy pt Calcium to low- no zometa today

## 2013-05-17 NOTE — Patient Instructions (Signed)

## 2013-05-17 NOTE — Progress Notes (Signed)
[facility]  [phone] [phone]) [phone]     HEMATOLOGY - OFFICE PROGRESS NOTE    DIAGNOSIS: IgG kappa multiple myeloma; presented with anemia, acute renal failure. Initial M spike was 5.15 g/dL; IgG 7040 mg/dL; positive Bence Jones protein. Bone marrow biopsy on 01/11/2013  showed 40% plasma cell. Cytogenetics and myeloma FISH panel are pending.   CURRENT THERAPY:  Velcade/Dex started in April 2014.  Revlimid was added to the regimen when her renal function stabilized, and Zometa after she had dental clearance in May 2014.   INTERVAL HISTORY: Ms. Jennifer Boyle returned to clinic for follow up by herself.  She has mild bilateral back pain.  Pain is worst with heavy exertion.  She is independent of all activities of daily living.  She does not need to take pain medication a routine basis except for occasional Tylenol. She denies lower than it weakness, paresthesia, bowel bladder incontinence. The rest of the 14 point review of system was negative.   Past Medical History  Diagnosis Date  . Pneumonia 10/2002; 2010    Required INTUBATION for streptococcal pneumonia/septicemia (10/2002)  . HIV disease DX: 01/2002  . Fibroid uterus   . 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 Peripheral neuropathy     resolved.   . Multiple myeloma 01/2013    01/11/2013 cytogenetics showed 59, XX, +2, +4, +5, +7, add(7)(p22), +, +9, +11, der(11) t(1;11)(q11; q24), +12, +15, -16, +17, +19, +20, +mar1, +mar2[cp6] / 46, XX [17].   Myeloma FISH showed presence of +11, and +17.   :    Past Surgical History  Procedure Laterality Date  . No past surgeries    :   CURRENT MEDS: Current  Outpatient Prescriptions  Medication Sig Dispense Refill  . abacavir-lamiVUDine (EPZICOM) 600-300 MG per tablet Take 1 tablet by mouth daily.  30 tablet  11  . acyclovir (ZOVIRAX) 400 MG tablet Take 0.5 tablets (200 mg total) by mouth daily.  60 tablet  3  . dexamethasone (DECADRON) 4 MG tablet Take 10 tablets (40 mg total) by mouth once a week.  200 tablet  2  . dolutegravir (TIVICAY) 50 MG tablet Take 1 tablet (50 mg total) by mouth daily.  30 tablet  11  . lenalidomide (REVLIMID) 15 MG capsule Take 1 capsule (15 mg total) by mouth daily. Rest 7 days and repeat  21 capsule  0  . lidocaine-prilocaine (EMLA) cream Apply topically as needed. Apply to portacath one hour before chemo access or blood draw.  30 g  3  . prochlorperazine (COMPAZINE) 10 MG tablet Take 1 tablet (10 mg total) by mouth every 6 (six) hours as needed (Nausea or vomiting).  30 tablet  1  . promethazine (PHENERGAN) 25 MG tablet Take 1 tablet (25 mg total) by mouth every 6 (six) hours as needed for nausea.  30 tablet  0  . sodium bicarbonate 650 MG tablet Take 1 tablet (650 mg total) by mouth 3 (three) times daily.  90 tablet  3  . warfarin (COUMADIN) 5 MG tablet Take 1.5 tablets (7.5 mg total) by mouth daily. 7.5 mg on Mon, Wed, Fri & Sun. 10 mg on Tues, Thurs &  Sat.  Or as Directed by MD.  48 tablet  3   No current facility-administered medications for this visit.      Allergies  Allergen Reactions  . Aspirin     Upset stomach   . Efavirenz Hives and Rash    (brand name sustiva)  :  Family History  Problem Relation Age of Onset  . Hypertension Mother   :  History   Social History  . Marital Status: Single    Spouse Name: N/A    Number of Children: 0  . Years of Education: N/A   Occupational History  .      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    Social History Main Topics  . Smoking status: Never Smoker   . Smokeless tobacco: Never Used  . Alcohol Use: No  . Drug Use: No  . Sexually Active: Not  Currently   Other Topics Concern  . Not on file   Social History Narrative   The patient is single. The patient has no children.   The patient is originally from [place] then moved to the [place] approximately 25 years ago.   The patient works as a Tour manager Banker).   Patient has never smoked. The patient does not use alcohol.    The patient has never used smokeless tobacco. The patient denies IV drug abuse.  :  REVIEW OF SYSTEM:  The rest of the 14-point review of sytem was negative.   Exam: ECOG 0-1  General:  well-nourished  woman,in no acute distress.  Eyes:  no scleral icterus.  ENT:  There were no oropharyngeal lesions.  Neck was without thyromegaly.  Lymphatics:  Negative cervical, supraclavicular or axillary adenopathy.  Respiratory: lungs were clear bilaterally without wheezing or crackles.  Cardiovascular:  Regular rate and rhythm, S1/S2, without murmur, rub or gallop.  There was no pedal edema.  GI:  abdomen was soft, flat, nontender, nondistended, without organomegaly.  Muscoloskeletal:  no spinal tenderness of palpation of vertebral spine.  Skin exam was without echymosis, petichae.  Neuro exam was nonfocal.  Patient was able to get on and off exam table without assistance.  Gait was normal.  Patient was alerted and oriented.  Attention was good.   Language was appropriate.  Mood was normal without depression.  Speech was not pressured.  Thought content was not tangential.    LABS:  Lab Results  Component Value Date   WBC 4.9 05/17/2013   HGB 10.1* 05/17/2013   HCT 30.1* 05/17/2013   PLT 270 05/17/2013   GLUCOSE 109 05/17/2013   CHOL 174 07/31/2012   TRIG 78 07/31/2012   HDL 40 07/31/2012   LDLCALC 118* 07/31/2012   ALT 28 05/02/2013   AST 23 05/02/2013   NA 143 05/17/2013   K 4.2 05/17/2013   CL 109 05/02/2013   CREATININE 1.0 05/17/2013   BUN 13.1 05/17/2013   CO2 20* 05/17/2013   INR 3.19* 05/17/2013   MICROALBUR 48.81* 12/20/2012    ASSESSMENT AND PLAN:    1.  Multiple  myeloma:  - continue to have good response now on Revlimid/Dex/Velcade.  - Her renal function has significantly improved. She remains on Revlimid to 15mg  PO daily. If her renal function continues to improve, we may consider increasing the dose of her to 20 or 25 mg by mouth daily with future cycles. - I recommend her to change her Coumadin dose to 10 mg on Thursday only and 7.5 mg all other days  of the week. - To prevent risk of herpes reactivation, I advise her to continue Acyclovir.     2.  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 She has not had side effect.   3.  Long term plan:  - She applying for ensure that she can go to transplant Center to be evaluated for the role of transplant. If she does not have insurance or qualify for transplant, we may consider 8-12 months total of chemotherapy with Velcade, Revlimid, dexamethasone and then start maintenance therapy with Revlimid if she has good response to induction chemotherapy.  4.  HIV/AIDS:  On abacavir, Dolutegravir, Lamuvidine per ID.   5.  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  Cr has significantly improved.   6.  Anemia:  Due to myeloma, renal insufficiency, chemo treatment.  She has no active bleeding nor severe symptoms of anemia.  We will continue to check weekly CBC and consider transfusion when Hgb <7.   7.  Follow up:  Weekly lab and chemo.  Return visit in about 1 month.     The length of time of the face-to-face encounter was 30 minutes. More than 50% of time was spent counseling and coordination of care.

## 2013-05-20 ENCOUNTER — Telehealth: Payer: Self-pay | Admitting: *Deleted

## 2013-05-20 ENCOUNTER — Encounter: Payer: Self-pay | Admitting: Oncology

## 2013-05-20 NOTE — Telephone Encounter (Signed)
Message sent on 05-17-2013 to patient about how to take coumadin was not read.  Called patient and voicemail left with instructions as indicated in MyChart message.

## 2013-05-21 ENCOUNTER — Telehealth: Payer: Self-pay | Admitting: Oncology

## 2013-05-21 NOTE — Telephone Encounter (Signed)
, °

## 2013-05-22 ENCOUNTER — Encounter: Payer: Self-pay | Admitting: Hematology and Oncology

## 2013-05-24 ENCOUNTER — Other Ambulatory Visit: Payer: Self-pay | Admitting: Lab

## 2013-05-24 ENCOUNTER — Ambulatory Visit (HOSPITAL_BASED_OUTPATIENT_CLINIC_OR_DEPARTMENT_OTHER): Payer: No Typology Code available for payment source

## 2013-05-24 ENCOUNTER — Ambulatory Visit: Payer: Self-pay

## 2013-05-24 ENCOUNTER — Other Ambulatory Visit (HOSPITAL_BASED_OUTPATIENT_CLINIC_OR_DEPARTMENT_OTHER): Payer: No Typology Code available for payment source

## 2013-05-24 ENCOUNTER — Telehealth: Payer: Self-pay | Admitting: *Deleted

## 2013-05-24 VITALS — BP 143/83 | HR 80 | Temp 97.6°F | Resp 20

## 2013-05-24 DIAGNOSIS — Z5112 Encounter for antineoplastic immunotherapy: Secondary | ICD-10-CM

## 2013-05-24 DIAGNOSIS — C9000 Multiple myeloma not having achieved remission: Secondary | ICD-10-CM

## 2013-05-24 LAB — COMPREHENSIVE METABOLIC PANEL (CC13)
ALT: 20 U/L (ref 0–55)
AST: 22 U/L (ref 5–34)
Albumin: 3.3 g/dL — ABNORMAL LOW (ref 3.5–5.0)
Alkaline Phosphatase: 138 U/L (ref 40–150)
BUN: 16.2 mg/dL (ref 7.0–26.0)
Calcium: 9 mg/dL (ref 8.4–10.4)
Chloride: 112 mEq/L — ABNORMAL HIGH (ref 98–109)
Potassium: 4.1 mEq/L (ref 3.5–5.1)
Sodium: 142 mEq/L (ref 136–145)
Total Protein: 6.3 g/dL — ABNORMAL LOW (ref 6.4–8.3)

## 2013-05-24 LAB — CBC WITH DIFFERENTIAL/PLATELET
BASO%: 0.7 % (ref 0.0–2.0)
Basophils Absolute: 0 10*3/uL (ref 0.0–0.1)
EOS%: 5.3 % (ref 0.0–7.0)
HGB: 9.9 g/dL — ABNORMAL LOW (ref 11.6–15.9)
MCH: 32.7 pg (ref 25.1–34.0)
MCHC: 33 g/dL (ref 31.5–36.0)
MCV: 99 fL (ref 79.5–101.0)
MONO%: 14.4 % — ABNORMAL HIGH (ref 0.0–14.0)
NEUT%: 46 % (ref 38.4–76.8)
RDW: 14.2 % (ref 11.2–14.5)
lymph#: 1.5 10*3/uL (ref 0.9–3.3)

## 2013-05-24 LAB — PROTHROMBIN TIME
INR: 3.77 — ABNORMAL HIGH (ref <1.50)
Prothrombin Time: 35.8 seconds — ABNORMAL HIGH (ref 11.6–15.2)

## 2013-05-24 MED ORDER — BORTEZOMIB CHEMO SQ INJECTION 3.5 MG (2.5MG/ML)
1.3000 mg/m2 | Freq: Once | INTRAMUSCULAR | Status: AC
Start: 2013-05-24 — End: 2013-05-24
  Administered 2013-05-24: 2.25 mg via SUBCUTANEOUS
  Filled 2013-05-24: qty 2.25

## 2013-05-24 MED ORDER — ONDANSETRON HCL 8 MG PO TABS
8.0000 mg | ORAL_TABLET | Freq: Once | ORAL | Status: AC
  Administered 2013-05-24: 8 mg via ORAL

## 2013-05-24 MED ORDER — HEPARIN SOD (PORK) LOCK FLUSH 100 UNIT/ML IV SOLN
500.0000 [IU] | Freq: Once | INTRAVENOUS | Status: AC
  Administered 2013-05-24: 500 [IU] via INTRAVENOUS
  Filled 2013-05-24: qty 5

## 2013-05-24 MED ORDER — HEPARIN SOD (PORK) LOCK FLUSH 100 UNIT/ML IV SOLN
500.0000 [IU] | Freq: Once | INTRAVENOUS | Status: AC | PRN
  Administered 2013-05-24: 500 [IU]
  Filled 2013-05-24: qty 5

## 2013-05-24 MED ORDER — ZOLEDRONIC ACID 4 MG/5ML IV CONC
4.0000 mg | Freq: Once | INTRAVENOUS | Status: AC
  Administered 2013-05-24: 4 mg via INTRAVENOUS
  Filled 2013-05-24: qty 5

## 2013-05-24 MED ORDER — SODIUM CHLORIDE 0.9 % IJ SOLN
10.0000 mL | INTRAMUSCULAR | Status: DC | PRN
  Administered 2013-05-24: 10 mL via INTRAVENOUS
  Filled 2013-05-24: qty 10

## 2013-05-24 MED ORDER — SODIUM CHLORIDE 0.9 % IJ SOLN
10.0000 mL | INTRAMUSCULAR | Status: DC | PRN
  Administered 2013-05-24: 10 mL
  Filled 2013-05-24: qty 10

## 2013-05-24 NOTE — Telephone Encounter (Signed)
Message copied by Wende Mott on Fri May 24, 2013  4:57 PM ------      Message from: Myrtis Ser      Created: Fri May 24, 2013  4:08 PM       Please call pt. Tell her to stop current dose of Coumadin. Take 7.5 mg daily. We will recheck PT/INR next week. ------

## 2013-05-24 NOTE — Telephone Encounter (Signed)
Left detailed VM for pt informing of INR result and to take coumadin 7.5 mg daily per Clenton Pare, NP.  Keep lab appt on 8/22 as scheduled.

## 2013-05-24 NOTE — Patient Instructions (Addendum)
[facility] Discharge Instructions for Patients Receiving Chemotherapy  Today you received the following chemotherapy agents Velcade/Zometa  To help prevent nausea and vomiting after your treatment, we encourage you to take your nausea medication as prescribed.    If you develop nausea and vomiting that is not controlled by your nausea medication, call the clinic.   BELOW ARE SYMPTOMS THAT SHOULD BE REPORTED IMMEDIATELY:  *FEVER GREATER THAN 100.5 F  *CHILLS WITH OR WITHOUT FEVER  NAUSEA AND VOMITING THAT IS NOT CONTROLLED WITH YOUR NAUSEA MEDICATION  *UNUSUAL SHORTNESS OF BREATH  *UNUSUAL BRUISING OR BLEEDING  TENDERNESS IN MOUTH AND THROAT WITH OR WITHOUT PRESENCE OF ULCERS  *URINARY PROBLEMS  *BOWEL PROBLEMS  UNUSUAL RASH Items with * indicate a potential emergency and should be followed up as soon as possible.  Feel free to call the clinic you have any questions or concerns. The clinic phone number is [phone].    

## 2013-05-28 LAB — PROTEIN ELECTROPHORESIS, SERUM
Beta 2: 2.6 % — ABNORMAL LOW (ref 3.2–6.5)
Beta Globulin: 5.9 % (ref 4.7–7.2)
M-Spike, %: 0.49 g/dL
Total Protein, Serum Electrophoresis: 5.8 g/dL — ABNORMAL LOW (ref 6.0–8.3)

## 2013-05-31 ENCOUNTER — Ambulatory Visit (HOSPITAL_BASED_OUTPATIENT_CLINIC_OR_DEPARTMENT_OTHER): Payer: Self-pay

## 2013-05-31 ENCOUNTER — Encounter: Payer: Self-pay | Admitting: Oncology

## 2013-05-31 ENCOUNTER — Other Ambulatory Visit (HOSPITAL_BASED_OUTPATIENT_CLINIC_OR_DEPARTMENT_OTHER): Payer: Self-pay

## 2013-05-31 VITALS — BP 116/70 | HR 79 | Temp 97.7°F | Resp 20

## 2013-05-31 DIAGNOSIS — C9000 Multiple myeloma not having achieved remission: Secondary | ICD-10-CM

## 2013-05-31 DIAGNOSIS — D4989 Neoplasm of unspecified behavior of other specified sites: Secondary | ICD-10-CM

## 2013-05-31 DIAGNOSIS — B20 Human immunodeficiency virus [HIV] disease: Secondary | ICD-10-CM

## 2013-05-31 DIAGNOSIS — C9002 Multiple myeloma in relapse: Secondary | ICD-10-CM

## 2013-05-31 DIAGNOSIS — Z5112 Encounter for antineoplastic immunotherapy: Secondary | ICD-10-CM

## 2013-05-31 LAB — CBC WITH DIFFERENTIAL/PLATELET
BASO%: 0.4 % (ref 0.0–2.0)
EOS%: 3.1 % (ref 0.0–7.0)
HCT: 29.8 % — ABNORMAL LOW (ref 34.8–46.6)
LYMPH%: 28.3 % (ref 14.0–49.7)
MCH: 32.2 pg (ref 25.1–34.0)
MCHC: 33.2 g/dL (ref 31.5–36.0)
MCV: 97.1 fL (ref 79.5–101.0)
MONO%: 18.9 % — ABNORMAL HIGH (ref 0.0–14.0)
NEUT%: 49.3 % (ref 38.4–76.8)
lymph#: 1.5 10*3/uL (ref 0.9–3.3)

## 2013-05-31 LAB — BASIC METABOLIC PANEL (CC13)
CO2: 20 mEq/L — ABNORMAL LOW (ref 22–29)
Chloride: 112 mEq/L — ABNORMAL HIGH (ref 98–109)
Sodium: 143 mEq/L (ref 136–145)

## 2013-05-31 LAB — PROTHROMBIN TIME: INR: 3.28 — ABNORMAL HIGH (ref <1.50)

## 2013-05-31 MED ORDER — BORTEZOMIB CHEMO SQ INJECTION 3.5 MG (2.5MG/ML)
1.3000 mg/m2 | Freq: Once | INTRAMUSCULAR | Status: AC
  Administered 2013-05-31: 2.25 mg via SUBCUTANEOUS
  Filled 2013-05-31: qty 2.25

## 2013-05-31 MED ORDER — SODIUM CHLORIDE 0.9 % IJ SOLN
10.0000 mL | INTRAMUSCULAR | Status: DC | PRN
  Administered 2013-05-31: 10 mL via INTRAVENOUS
  Filled 2013-05-31: qty 10

## 2013-05-31 MED ORDER — HEPARIN SOD (PORK) LOCK FLUSH 100 UNIT/ML IV SOLN
500.0000 [IU] | Freq: Once | INTRAVENOUS | Status: AC
  Administered 2013-05-31: 500 [IU] via INTRAVENOUS
  Filled 2013-05-31: qty 5

## 2013-05-31 MED ORDER — ONDANSETRON HCL 8 MG PO TABS
8.0000 mg | ORAL_TABLET | Freq: Once | ORAL | Status: AC
  Administered 2013-05-31: 8 mg via ORAL

## 2013-05-31 NOTE — Patient Instructions (Signed)
[facility] Discharge Instructions for Patients Receiving Chemotherapy  Today you received the following chemotherapy agents: velcade  To help prevent nausea and vomiting after your treatment, we encourage you to take your nausea medication.  Take it as often as prescribed.     If you develop nausea and vomiting that is not controlled by your nausea medication, call the clinic. If it is after clinic hours your family physician or the after hours number for the clinic or go to the Emergency Department.   BELOW ARE SYMPTOMS THAT SHOULD BE REPORTED IMMEDIATELY:  *FEVER GREATER THAN 100.5 F  *CHILLS WITH OR WITHOUT FEVER  NAUSEA AND VOMITING THAT IS NOT CONTROLLED WITH YOUR NAUSEA MEDICATION  *UNUSUAL SHORTNESS OF BREATH  *UNUSUAL BRUISING OR BLEEDING  TENDERNESS IN MOUTH AND THROAT WITH OR WITHOUT PRESENCE OF ULCERS  *URINARY PROBLEMS  *BOWEL PROBLEMS  UNUSUAL RASH Items with * indicate a potential emergency and should be followed up as soon as possible.  Feel free to call the clinic you have any questions or concerns. The clinic phone number is [phone].   I have been informed and understand all the instructions given to me. I know to contact the clinic, my physician, or go to the Emergency Department if any problems should occur. I do not have any questions at this time, but understand that I may call the clinic during office hours   should I have any questions or need assistance in obtaining follow up care.    __________________________________________  _____________  __________ Signature of Patient or Authorized Representative            Date                   Tim   __________________________________________ Nurse's Signature

## 2013-06-03 ENCOUNTER — Telehealth: Payer: Self-pay | Admitting: *Deleted

## 2013-06-03 ENCOUNTER — Other Ambulatory Visit: Payer: Self-pay | Admitting: *Deleted

## 2013-06-03 NOTE — Telephone Encounter (Signed)
Patient states she takes coumadin, 7 mg daily except on thursdays she takes 10 mg. Note to kristin curcio's np desk.

## 2013-06-03 NOTE — Telephone Encounter (Signed)
Called patient and gave iinstructions to take coumadin 7.5 mg daily and 5 mg on Monday. Per kristin curcio np

## 2013-06-07 ENCOUNTER — Other Ambulatory Visit: Payer: Self-pay | Admitting: *Deleted

## 2013-06-07 ENCOUNTER — Other Ambulatory Visit: Payer: Self-pay | Admitting: Oncology

## 2013-06-07 ENCOUNTER — Telehealth: Payer: Self-pay | Admitting: Hematology and Oncology

## 2013-06-07 DIAGNOSIS — C9000 Multiple myeloma not having achieved remission: Secondary | ICD-10-CM

## 2013-06-07 NOTE — Telephone Encounter (Signed)
THIS REFILL REQUEST FOR REVLIMID WAS GIVEN TO DR.ROSTAPSHOV'S NURSE, KATHY BUYCK,RN.

## 2013-06-07 NOTE — Telephone Encounter (Signed)
CALLED PT. SHE WILL START HER REST PERIOD ON 06/09/13. PT. WILL SCHEDULE HER LAB APPOINTMENT FOR THE URINE PREGNANCY TEST ON 06/11/13. KRISTIN CURCIO, APP/NP WILL PUT IN THE ORDER FOR THE URINE PREGNANCY TEST.

## 2013-06-07 NOTE — Telephone Encounter (Signed)
S/w pt re appt for 9/2.

## 2013-06-11 ENCOUNTER — Other Ambulatory Visit: Payer: Self-pay | Admitting: *Deleted

## 2013-06-11 ENCOUNTER — Other Ambulatory Visit: Payer: Self-pay

## 2013-06-11 ENCOUNTER — Encounter: Payer: Self-pay | Admitting: Hematology and Oncology

## 2013-06-11 DIAGNOSIS — C9000 Multiple myeloma not having achieved remission: Secondary | ICD-10-CM

## 2013-06-11 DIAGNOSIS — D4989 Neoplasm of unspecified behavior of other specified sites: Secondary | ICD-10-CM

## 2013-06-11 NOTE — Progress Notes (Signed)
I gave the patient a hardship application. Her discount expires end of month. She is really upset. Disability and medicaid denied her. She can't work and they are saying she can work. She will speak with dr on Friday to see if anything else is available to help her appeal. She said she has no money and her sister helps her here and there, but not much. She will call billing to see how her bill looks as far as a balance. She is ryan white also.

## 2013-06-12 ENCOUNTER — Ambulatory Visit (INDEPENDENT_AMBULATORY_CARE_PROVIDER_SITE_OTHER): Payer: No Typology Code available for payment source | Admitting: Infectious Disease

## 2013-06-12 ENCOUNTER — Encounter: Payer: Self-pay | Admitting: Infectious Disease

## 2013-06-12 ENCOUNTER — Other Ambulatory Visit: Payer: Self-pay | Admitting: *Deleted

## 2013-06-12 VITALS — BP 141/86 | HR 71 | Temp 98.5°F | Wt 158.0 lb

## 2013-06-12 DIAGNOSIS — B20 Human immunodeficiency virus [HIV] disease: Secondary | ICD-10-CM

## 2013-06-12 DIAGNOSIS — N19 Unspecified kidney failure: Secondary | ICD-10-CM

## 2013-06-12 DIAGNOSIS — Z23 Encounter for immunization: Secondary | ICD-10-CM

## 2013-06-12 DIAGNOSIS — C9000 Multiple myeloma not having achieved remission: Secondary | ICD-10-CM

## 2013-06-12 DIAGNOSIS — D4989 Neoplasm of unspecified behavior of other specified sites: Secondary | ICD-10-CM

## 2013-06-12 MED ORDER — LENALIDOMIDE 15 MG PO CAPS: 15.0000 mg | ORAL_CAPSULE | Freq: Every day | ORAL | Status: DC

## 2013-06-12 NOTE — Patient Instructions (Addendum)
We need you to meet with [facility] counselor to see about medicaid application

## 2013-06-12 NOTE — Progress Notes (Signed)
Subjective:    Patient ID: Jennifer Boyle, female    DOB: 07/19/1966, 47 y.o.   MRN: [id number]  HPI  [name] is a 47 y.o. female with HIV infection who is doing superbly well on her  antiviral regimen, with undetectable viral load and health cd4 count when last checked. She has been diagnosed with multiple myeloma and is receiving chemotherapy through the cancer Center. When last checked her renal function had improved dramatically (she had myeloma kidney) and her GFR was such that she needed up titration of her limited he been dose. Ultimately she has been consolidated to [employer] and Epzicom.  She has not yet had repeat blood work after having been changed to the new regimen and we will check these labs today. She's had her flu shot today as well. She is in fairly good spirits although she is frustrated still not having received disability or approval for Medicaid which she clearly deserves and needs. She has been followed by Dr. Allena Katz from nephrology who has been prescribing her sodium bicarbonate    Review of Systems  Constitutional: Negative for fever, chills, diaphoresis, activity change, appetite change, fatigue and unexpected weight change.  HENT: Negative for congestion, sore throat, rhinorrhea, sneezing, trouble swallowing and sinus pressure.   Eyes: Negative for photophobia and visual disturbance.  Respiratory: Negative for cough, chest tightness, shortness of breath, wheezing and stridor.   Cardiovascular: Negative for chest pain, palpitations and leg swelling.  Gastrointestinal: Negative for nausea, vomiting, abdominal pain, diarrhea, constipation, blood in stool, abdominal distention and anal bleeding.  Genitourinary: Negative for dysuria, hematuria, flank pain and difficulty urinating.  Musculoskeletal: Negative for myalgias, back pain, joint swelling, arthralgias and gait problem.  Skin: Negative for color change, pallor, rash and wound.  Neurological: Negative for  dizziness, tremors, weakness and light-headedness.  Hematological: Negative for adenopathy. Does not bruise/bleed easily.  Psychiatric/Behavioral: Negative for behavioral problems, confusion, sleep disturbance, dysphoric mood, decreased concentration and agitation.       Objective:   Physical Exam  Constitutional: She is oriented to person, place, and time. She appears well-developed and well-nourished. No distress.  HENT:  Head: Normocephalic and atraumatic.  Mouth/Throat: Oropharynx is clear and moist. No oropharyngeal exudate.  Eyes: Conjunctivae and EOM are normal. Pupils are equal, round, and reactive to light. No scleral icterus.  Neck: Normal range of motion. Neck supple. No JVD present.  Cardiovascular: Normal rate, regular rhythm and normal heart sounds.  Exam reveals no gallop and no friction rub.   No murmur heard. Pulmonary/Chest: Effort normal and breath sounds normal. No respiratory distress. She has no wheezes. She has no rales. She exhibits no tenderness.  Abdominal: She exhibits no distension and no mass. There is no tenderness. There is no rebound and no guarding.  Musculoskeletal: She exhibits no edema and no tenderness.  Lymphadenopathy:    She has no cervical adenopathy.  Neurological: She is alert and oriented to person, place, and time. She exhibits normal muscle tone. Coordination normal.  Skin: Skin is warm and dry. She is not diaphoretic. No erythema. No pallor.  Psychiatric: She has a normal mood and affect. Her behavior is normal. Judgment and thought content normal.          Assessment & Plan:  HIV: continue Tivicay and epzicom and when [employer] and [employer] work out agreement for coverage would change her to consolidated [employer]  Multiple myeloma: following at cancer center  Renal failure: due to #2 to followup with Dr. Allena Katz, does she  still needs sodium bicarbonate?

## 2013-06-12 NOTE — Telephone Encounter (Signed)
RECEIVED A FAX FROM [employer] CONCERNING A CONFIRMATION OF A  PRESCRIPTION REFERRAL FOR Revlimid..  ARRANGEMENT WILL BE MADE WITH PATIENT FOR BILLING AND SHIPMENT.

## 2013-06-13 ENCOUNTER — Telehealth: Payer: Self-pay | Admitting: *Deleted

## 2013-06-13 NOTE — Telephone Encounter (Signed)
Received confirmation fax from [facility] 06/12/13 reporting revlimid was shipped 06/12/13 for delivery next business day.

## 2013-06-14 ENCOUNTER — Other Ambulatory Visit (HOSPITAL_BASED_OUTPATIENT_CLINIC_OR_DEPARTMENT_OTHER): Payer: Self-pay

## 2013-06-14 ENCOUNTER — Other Ambulatory Visit: Payer: Self-pay | Admitting: Lab

## 2013-06-14 ENCOUNTER — Ambulatory Visit (HOSPITAL_BASED_OUTPATIENT_CLINIC_OR_DEPARTMENT_OTHER): Payer: Self-pay | Admitting: Hematology and Oncology

## 2013-06-14 ENCOUNTER — Ambulatory Visit (HOSPITAL_BASED_OUTPATIENT_CLINIC_OR_DEPARTMENT_OTHER): Payer: Self-pay

## 2013-06-14 VITALS — BP 130/81 | HR 80 | Temp 98.0°F | Resp 20 | Ht 64.0 in | Wt 159.1 lb

## 2013-06-14 DIAGNOSIS — M899 Disorder of bone, unspecified: Secondary | ICD-10-CM

## 2013-06-14 DIAGNOSIS — C9000 Multiple myeloma not having achieved remission: Secondary | ICD-10-CM

## 2013-06-14 DIAGNOSIS — Z5112 Encounter for antineoplastic immunotherapy: Secondary | ICD-10-CM

## 2013-06-14 DIAGNOSIS — D4989 Neoplasm of unspecified behavior of other specified sites: Secondary | ICD-10-CM

## 2013-06-14 DIAGNOSIS — G620 Drug-induced polyneuropathy: Secondary | ICD-10-CM

## 2013-06-14 LAB — BASIC METABOLIC PANEL (CC13)
BUN: 15.2 mg/dL (ref 7.0–26.0)
CO2: 21 mEq/L — ABNORMAL LOW (ref 22–29)
Chloride: 115 mEq/L — ABNORMAL HIGH (ref 98–109)
Creatinine: 0.9 mg/dL (ref 0.6–1.1)
Potassium: 4.4 mEq/L (ref 3.5–5.1)

## 2013-06-14 LAB — CBC WITH DIFFERENTIAL/PLATELET
Eosinophils Absolute: 0 10*3/uL (ref 0.0–0.5)
HCT: 31.2 % — ABNORMAL LOW (ref 34.8–46.6)
HGB: 10.3 g/dL — ABNORMAL LOW (ref 11.6–15.9)
LYMPH%: 18.2 % (ref 14.0–49.7)
MONO#: 0.1 10*3/uL (ref 0.1–0.9)
NEUT#: 4.3 10*3/uL (ref 1.5–6.5)
NEUT%: 79 % — ABNORMAL HIGH (ref 38.4–76.8)
Platelets: 300 10*3/uL (ref 145–400)
WBC: 5.4 10*3/uL (ref 3.9–10.3)

## 2013-06-14 LAB — HIV-1 RNA QUANT-NO REFLEX-BLD
HIV 1 RNA Quant: <20 copies/mL (ref <20)
HIV-1 RNA Quant, Log: <1.3 {Log} (ref <1.30)

## 2013-06-14 MED ORDER — ONDANSETRON HCL 8 MG PO TABS
ORAL_TABLET | ORAL | Status: AC
  Filled 2013-06-14: qty 1

## 2013-06-14 MED ORDER — BORTEZOMIB CHEMO SQ INJECTION 3.5 MG (2.5MG/ML)
1.0000 mg/m2 | Freq: Once | INTRAMUSCULAR | Status: AC
  Administered 2013-06-14: 1.75 mg via SUBCUTANEOUS
  Filled 2013-06-14: qty 1.75

## 2013-06-14 MED ORDER — ONDANSETRON HCL 8 MG PO TABS
8.0000 mg | ORAL_TABLET | Freq: Once | ORAL | Status: AC
  Administered 2013-06-14: 8 mg via ORAL

## 2013-06-17 ENCOUNTER — Telehealth: Payer: Self-pay | Admitting: *Deleted

## 2013-06-17 ENCOUNTER — Ambulatory Visit: Payer: Self-pay | Admitting: Infectious Disease

## 2013-06-17 NOTE — Telephone Encounter (Signed)
Per staff message and POF I have scheduled appts.  JMW  

## 2013-06-17 NOTE — Progress Notes (Signed)
IDHartleigh Boyle OB: 02-Apr-1966  MR#: [id number]  JXB#:[id number]  [facility]  Telephone:(336) [phone] Fax:(336) [phone]   OFFICE PROGRESS NOTE  PCP: Jennifer Lav, MD   DIAGNOSIS: IgG kappa multiple myeloma; presented with anemia, acute renal failure. Initial M spike was 5.15 g/dL; IgG 7040 mg/dL; positive Bence Jones protein. Bone marrow biopsy on 01/11/2013 showed 40% plasma cell. Cytogenetics and myeloma FISH panel are pending.   CURRENT THERAPY: Velcade/Dex started in April 2014. Revlimid was added to the regimen when her renal function stabilized, and Zometa after she had dental clearance in May 2014.   INTERVAL HISTORY: Jennifer Boyle returned to clinic for follow up visit. She reported neuropathy in feet and fingers which is painful. Her appetite is fine and weight is stable. She has blurry vision. She try to control constipation by Jennifer Boyle but not very efficiently. The patient denied fever, chills, night sweats. She denied headaches, double vision,  nasal congestion, nasal discharge, hearing problems, odynophagia or dysphagia. No chest pain, palpitations, dyspnea, cough, abdominal pain, nausea, vomiting, diarrhea, hematochezia. The patient denied dysuria, nocturia, polyuria, hematuria, myalgia, tingling, psychiatric problems.  Review of Systems  Constitutional: Negative for fever, chills, weight loss, malaise/fatigue and diaphoresis.  HENT: Negative for hearing loss, ear pain, nosebleeds, congestion, sore throat, neck pain, tinnitus and ear discharge.   Eyes: Positive for blurred vision. Negative for double vision, photophobia and pain.  Respiratory: Negative for cough, hemoptysis, sputum production, shortness of breath and wheezing.   Cardiovascular: Negative for chest pain, palpitations, orthopnea, claudication, leg swelling and PND.  Gastrointestinal: Positive for constipation. Negative for heartburn, nausea, vomiting, abdominal pain, diarrhea, blood in  stool and melena.  Genitourinary: Negative for dysuria, urgency, frequency and hematuria.  Musculoskeletal: Positive for myalgias. Negative for back pain, joint pain and falls.  Skin: Negative for itching and rash.  Neurological: Positive for sensory change. Negative for dizziness, tingling, tremors, speech change, focal weakness, seizures, weakness and headaches.  Endo/Heme/Allergies: Bruises/bleeds easily.  Psychiatric/Behavioral: Negative.      PAST MEDICAL HISTORY: Past Medical History  Diagnosis Date  . Pneumonia 10/2002; 2010    Required INTUBATION for streptococcal pneumonia/septicemia (10/2002)  . HIV disease DX: 01/2002  . Fibroid uterus   . 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 Peripheral neuropathy     resolved.   . Multiple myeloma 01/2013    01/11/2013 cytogenetics showed 59, XX, +2, +4, +5, +7, add(7)(p22), +, +9, +11, der(11) t(1;11)(q11; q24), +12, +15, -16, +17, +19, +20, +mar1, +mar2[cp6] / 46, XX [17].   Myeloma FISH showed presence of +11, and +17.     PAST SURGICAL HISTORY: Past Surgical History  Procedure Laterality Date  . No past surgeries      FAMILY HISTORY Family History  Problem Relation Age of Onset  . Hypertension Mother    HEALTH MAINTENANCE: History  Substance Use Topics  . Smoking status: Never Smoker   . Smokeless tobacco: Never Used  . Alcohol Use: No   Allergies  Allergen Reactions  . Aspirin     Upset stomach   . Efavirenz Hives and Rash    (brand name sustiva)    Current Outpatient Prescriptions  Medication Sig Dispense  Refill  . abacavir-lamiVUDine (EPZICOM) 600-300 MG per tablet Take 1 tablet by mouth daily.  30 tablet  11  . acyclovir  (ZOVIRAX) 400 MG tablet Take 0.5 tablets (200 mg total) by mouth daily.  60 tablet  3  . dexamethasone (DECADRON) 4 MG tablet Take 10 tablets (40 mg total) by mouth once a week.  200 tablet  2  . dolutegravir (TIVICAY) 50 MG tablet Take 1 tablet (50 mg total) by mouth daily.  30 tablet  11  . lenalidomide (REVLIMID) 15 MG capsule Take 1 capsule (15 mg total) by mouth daily. Rest 7 days and repeat  21 capsule  0  . lidocaine-prilocaine (EMLA) cream Apply topically as needed. Apply to portacath one hour before chemo access or blood draw.  30 g  3  . prochlorperazine (COMPAZINE) 10 MG tablet Take 1 tablet (10 mg total) by mouth every 6 (six) hours as needed (Nausea or vomiting).  30 tablet  1  . promethazine (PHENERGAN) 25 MG tablet Take 1 tablet (25 mg total) by mouth every 6 (six) hours as needed for nausea.  30 tablet  0  . sodium bicarbonate 650 MG tablet Take 1 tablet (650 mg total) by mouth 3 (three) times daily.  90 tablet  3  . warfarin (COUMADIN) 5 MG tablet Take 1.5 tablets (7.5 mg total) by mouth daily. 7.5 mg on Mon, Wed, Fri & Sun. 10 mg on Tues, Thurs & Sat.  Or as Directed by MD.  48 tablet  3   No current facility-administered medications for this visit.    OBJECTIVE: Filed Vitals:   06/14/13 1311  BP: 130/81  Pulse: 80  Temp: 98 F (36.7 C)  Resp: 20     Body mass index is 27.3 kg/(m^2).    ECOG FS:0 PHYSICAL EXAMINATION:  HEENT: Sclerae anicteric.  Conjunctivae were pink. Pupils round and reactive bilaterally. Oral mucosa is moist without ulceration or thrush. No occipital, submandibular, cervical, supraclavicular or axillar adenopathy. Lungs: clear to auscultation without wheezes. No rales or rhonchi. Heart: regular rate and rhythm. No murmur, gallop or rubs. Abdomen: soft, non tender. No guarding or rebound tenderness. Bowel sounds are present. No palpable hepatosplenomegaly. MSK: no focal spinal tenderness. Extremities: No clubbing or cyanosis.No calf tenderness to  palpitation, no peripheral edema. The patient had grossly intact strength in upper and lower extremities. Skin exam was without ecchymosis, petechiae. Neuro: non-focal, alert and oriented to time, person and place, appropriate affect  LAB RESULTS:  CMP     Component Value Date/Time   NA 143 06/14/2013 1301   NA 139 05/02/2013 1412   K 4.4 06/14/2013 1301   K 4.1 05/02/2013 1412   CL 109 05/02/2013 1412   CL 111* 03/29/2013 1351   CO2 21* 06/14/2013 1301   CO2 22 05/02/2013 1412   GLUCOSE 106 06/14/2013 1301   GLUCOSE 73 05/02/2013 1412   GLUCOSE 129* 03/29/2013 1351   BUN 15.2 06/14/2013 1301   BUN 14 05/02/2013 1412   CREATININE 0.9 06/14/2013 1301   CREATININE 1.00 05/02/2013 1412   CREATININE 4.01* 02/04/2013 0850   CALCIUM 8.7 06/14/2013 1301   CALCIUM 8.4 05/02/2013 1412   PROT 6.3* 05/24/2013 1217   PROT 6.1 05/02/2013 1412   ALBUMIN 3.3* 05/24/2013 1217   ALBUMIN 3.5 05/02/2013 1412   AST 22 05/24/2013 1217   AST 23 05/02/2013 1412   ALT 20 05/24/2013 1217   ALT 28 05/02/2013 1412   ALKPHOS 138 05/24/2013 1217   ALKPHOS  154* 05/02/2013 1412   BILITOT 0.39 05/24/2013 1217   BILITOT 0.4 05/02/2013 1412   GFRNONAA 12* 02/04/2013 0850   GFRAA 14* 02/04/2013 0850    Lab Results  Component Value Date   WBC 5.4 06/14/2013   NEUTROABS 4.3 06/14/2013   HGB 10.3* 06/14/2013   HCT 31.2* 06/14/2013   MCV 98.1 06/14/2013   PLT 300 06/14/2013      Chemistry      Component Value Date/Time   NA 143 06/14/2013 1301   NA 139 05/02/2013 1412   K 4.4 06/14/2013 1301   K 4.1 05/02/2013 1412   CL 109 05/02/2013 1412   CL 111* 03/29/2013 1351   CO2 21* 06/14/2013 1301   CO2 22 05/02/2013 1412   BUN 15.2 06/14/2013 1301   BUN 14 05/02/2013 1412   CREATININE 0.9 06/14/2013 1301   CREATININE 1.00 05/02/2013 1412   CREATININE 4.01* 02/04/2013 0850      Component Value Date/Time   CALCIUM 8.7 06/14/2013 1301   CALCIUM 8.4 05/02/2013 1412   ALKPHOS 138 05/24/2013 1217   ALKPHOS 154* 05/02/2013 1412   AST 22 05/24/2013 1217   AST 23  05/02/2013 1412   ALT 20 05/24/2013 1217   ALT 28 05/02/2013 1412   BILITOT 0.39 05/24/2013 1217   BILITOT 0.4 05/02/2013 1412       Recent Labs Lab 06/14/13 1301  INR 1.76*    Urinalysis    Component Value Date/Time   COLORURINE YELLOW 02/07/2013 1226   APPEARANCEUR CLEAR 02/07/2013 1226   LABSPEC 1.016 02/07/2013 1226   PHURINE 7.5 02/07/2013 1226   GLUCOSEU NEG 02/07/2013 1226   HGBUR LARGE* 02/07/2013 1226   BILIRUBINUR NEG 02/07/2013 1226   KETONESUR NEG 02/07/2013 1226   PROTEINUR 100* 02/07/2013 1226   UROBILINOGEN 0.2 02/07/2013 1226   NITRITE NEG 02/07/2013 1226   LEUKOCYTESUR TRACE* 02/07/2013 1226    STUDIES: No results found.  ASSESSMENT AND PLAN: 1. Multiple myeloma:  - She has good response  on Revlimid/Dex/Velcade.  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 I will do one step reduction of Velcade dose. - I will see patient in one week before next Velcade dose. - Continue Acyclovir.  2. 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 She has not had side effect.  3. Follow up: in one week.     Myra Rude, MD   06/13/2013 4:11 PM

## 2013-06-18 ENCOUNTER — Ambulatory Visit: Payer: Self-pay

## 2013-06-18 ENCOUNTER — Telehealth: Payer: Self-pay | Admitting: *Deleted

## 2013-06-18 ENCOUNTER — Telehealth: Payer: Self-pay | Admitting: Hematology and Oncology

## 2013-06-18 NOTE — Telephone Encounter (Signed)
S/w the pt regarding her appts for this Friday on 06/21/2013. The pt stated that she had some questions for the md transferred the pt over to speak with the desk nurse regarding the questions she had. Pt is aware of her appts

## 2013-06-18 NOTE — Telephone Encounter (Signed)
Per scheduler I have adjusted 10/12

## 2013-06-19 ENCOUNTER — Telehealth: Payer: Self-pay | Admitting: *Deleted

## 2013-06-19 ENCOUNTER — Encounter: Payer: Self-pay | Admitting: Hematology and Oncology

## 2013-06-19 NOTE — Telephone Encounter (Signed)
Pt states she never got any call or instruction about her Coumadin last week.  INR was done on 9/05 and she continues to take Coumadin as previously directed, 7.5 mg daily except on Mondays is taking 5 mg.    Note to Dr. Alecia Lemming for instructions.  Per Dr. Alecia Lemming, pt to continue on Coumadin 7.5 mg for now until she has INR checked again this Friday. Instructed pt on above.  She verbalized understanding.  She requests Flush nurse appt w/ all lab appts.  She only wants labs drawn from her [facility] a cath.  Sent order to scheduling to add Flush nurse on this Friday w/ lab appt.

## 2013-06-21 ENCOUNTER — Ambulatory Visit (HOSPITAL_BASED_OUTPATIENT_CLINIC_OR_DEPARTMENT_OTHER): Payer: Self-pay | Admitting: Physician Assistant

## 2013-06-21 ENCOUNTER — Other Ambulatory Visit (HOSPITAL_BASED_OUTPATIENT_CLINIC_OR_DEPARTMENT_OTHER): Payer: Self-pay

## 2013-06-21 ENCOUNTER — Ambulatory Visit: Payer: Self-pay | Admitting: Physician Assistant

## 2013-06-21 ENCOUNTER — Other Ambulatory Visit: Payer: Self-pay | Admitting: *Deleted

## 2013-06-21 ENCOUNTER — Other Ambulatory Visit: Payer: Self-pay | Admitting: Lab

## 2013-06-21 ENCOUNTER — Telehealth: Payer: Self-pay | Admitting: Hematology and Oncology

## 2013-06-21 ENCOUNTER — Ambulatory Visit (HOSPITAL_BASED_OUTPATIENT_CLINIC_OR_DEPARTMENT_OTHER): Payer: Self-pay

## 2013-06-21 ENCOUNTER — Ambulatory Visit: Payer: No Typology Code available for payment source

## 2013-06-21 ENCOUNTER — Encounter: Payer: Self-pay | Admitting: Hematology and Oncology

## 2013-06-21 VITALS — BP 144/86 | HR 78 | Temp 97.6°F | Resp 20 | Ht 64.0 in | Wt 159.8 lb

## 2013-06-21 DIAGNOSIS — C9000 Multiple myeloma not having achieved remission: Secondary | ICD-10-CM

## 2013-06-21 DIAGNOSIS — Z7901 Long term (current) use of anticoagulants: Secondary | ICD-10-CM

## 2013-06-21 DIAGNOSIS — Z5112 Encounter for antineoplastic immunotherapy: Secondary | ICD-10-CM

## 2013-06-21 DIAGNOSIS — D4989 Neoplasm of unspecified behavior of other specified sites: Secondary | ICD-10-CM

## 2013-06-21 LAB — CBC WITH DIFFERENTIAL/PLATELET
EOS%: 2.2 % (ref 0.0–7.0)
Eosinophils Absolute: 0.1 10*3/uL (ref 0.0–0.5)
HGB: 10.2 g/dL — ABNORMAL LOW (ref 11.6–15.9)
MCH: 33.3 pg (ref 25.1–34.0)
MCV: 99.9 fL (ref 79.5–101.0)
MONO%: 7 % (ref 0.0–14.0)
NEUT#: 2.8 10*3/uL (ref 1.5–6.5)
RBC: 3.07 10*6/uL — ABNORMAL LOW (ref 3.70–5.45)
RDW: 16.7 % — ABNORMAL HIGH (ref 11.2–14.5)
lymph#: 1 10*3/uL (ref 0.9–3.3)

## 2013-06-21 LAB — BASIC METABOLIC PANEL (CC13)
BUN: 11.7 mg/dL (ref 7.0–26.0)
Calcium: 8.4 mg/dL (ref 8.4–10.4)
Creatinine: 0.9 mg/dL (ref 0.6–1.1)

## 2013-06-21 LAB — PROTHROMBIN TIME
INR: 2.32 — ABNORMAL HIGH (ref <1.50)
Prothrombin Time: 24.7 seconds — ABNORMAL HIGH (ref 11.6–15.2)

## 2013-06-21 MED ORDER — ZOLEDRONIC ACID 4 MG/100ML IV SOLN
4.0000 mg | Freq: Once | INTRAVENOUS | Status: AC
  Administered 2013-06-21: 4 mg via INTRAVENOUS
  Filled 2013-06-21: qty 100

## 2013-06-21 MED ORDER — SODIUM CHLORIDE 0.9 % IJ SOLN
10.0000 mL | INTRAMUSCULAR | Status: DC | PRN
  Administered 2013-06-21: 10 mL via INTRAVENOUS
  Filled 2013-06-21: qty 10

## 2013-06-21 MED ORDER — HEPARIN SOD (PORK) LOCK FLUSH 100 UNIT/ML IV SOLN
500.0000 [IU] | Freq: Once | INTRAVENOUS | Status: AC
  Administered 2013-06-21: 500 [IU] via INTRAVENOUS
  Filled 2013-06-21: qty 5

## 2013-06-21 MED ORDER — ONDANSETRON HCL 8 MG PO TABS
ORAL_TABLET | ORAL | Status: AC
  Filled 2013-06-21: qty 1

## 2013-06-21 MED ORDER — SODIUM CHLORIDE 0.9 % IV SOLN: Freq: Once | INTRAVENOUS | Status: DC

## 2013-06-21 MED ORDER — BORTEZOMIB CHEMO SQ INJECTION 3.5 MG (2.5MG/ML)
1.0000 mg/m2 | Freq: Once | INTRAMUSCULAR | Status: AC
Start: 2013-06-21 — End: 2013-06-21
  Administered 2013-06-21: 1.75 mg via SUBCUTANEOUS
  Filled 2013-06-21: qty 1.75

## 2013-06-21 MED ORDER — ONDANSETRON HCL 8 MG PO TABS
8.0000 mg | ORAL_TABLET | Freq: Once | ORAL | Status: AC
  Administered 2013-06-21: 8 mg via ORAL

## 2013-06-21 MED ORDER — HEPARIN SOD (PORK) LOCK FLUSH 100 UNIT/ML IV SOLN
500.0000 [IU] | Freq: Once | INTRAVENOUS | Status: AC | PRN
  Administered 2013-06-21: 500 [IU]
  Filled 2013-06-21: qty 5

## 2013-06-21 MED ORDER — SODIUM CHLORIDE 0.9 % IJ SOLN
10.0000 mL | INTRAMUSCULAR | Status: DC | PRN
  Administered 2013-06-21: 10 mL
  Filled 2013-06-21: qty 10

## 2013-06-21 NOTE — Progress Notes (Signed)
Jennifer Boyle OB: 1966-09-18  MR#: [id number]  WUJ#:[id number]  [facility]  Telephone:(336) [phone] [phone]) [phone]   OFFICE PROGRESS NOTE  PCP: [name], MD   DIAGNOSIS: IgG kappa multiple myeloma; presented with anemia, acute renal failure. Initial M spike was 5.15 g/dL; IgG 7040 mg/dL; positive Bence Jones protein. Bone marrow biopsy on 01/11/2013 showed 40% plasma cell. Cytogenetics and myeloma FISH panel are pending.   CURRENT THERAPY: Velcade/Dex started in April 2014. Revlimid was added to the regimen when her renal function stabilized, and Zometa after she had dental clearance in May 2014.   INTERVAL HISTORY: Ms. Jennifer Boyle returned to clinic for follow up visit. She reports some neuropathy involving her feet and hands. She describes it as numbness and tingling in her fingers which is worse when they or cold. She reports a prior neuropathy primarily involving her feet in 2003 when she was diagnosed with HIV when her viral load was high. Once she was treated and things are under control that resolved. This current peripheral neuropathy seems more related to her treatment with Revlimid and Velcade. She is also on Coumadin currently at 7.5 mg daily except for 5 mg on Mondays. She had repeat labs today including a PT/INR for further management of her Coumadin therapy. She is having some issues with constipation. She states that Colace was not helpful she was taking 100 mg twice daily. She is currently taking MiraLAX which she does find helpful and also warm prune juice. Her appetite is fine and weight is stable. She has blurry vision. he patient denied fever, chills, night sweats. She denied headaches, double vision,  nasal congestion, nasal discharge, hearing problems, odynophagia or dysphagia. No chest pain, palpitations, dyspnea, cough, abdominal pain, nausea, vomiting, diarrhea, hematochezia. The patient denied dysuria, nocturia, polyuria, hematuria, myalgia,  tingling, psychiatric problems.  Review of Systems  Constitutional: Negative for fever, chills, weight loss, malaise/fatigue and diaphoresis.  HENT: Negative for hearing loss, ear pain, nosebleeds, congestion, sore throat, neck pain, tinnitus and ear discharge.   Eyes: Positive for blurred vision. Negative for double vision, photophobia and pain.  Respiratory: Negative for cough, hemoptysis, sputum production, shortness of breath and wheezing.   Cardiovascular: Negative for chest pain, palpitations, orthopnea, claudication, leg swelling and PND.  Gastrointestinal: Positive for constipation. Negative for heartburn, nausea, vomiting, abdominal pain, diarrhea, blood in stool and melena.  Genitourinary: Negative for dysuria, urgency, frequency and hematuria.  Musculoskeletal: Positive for myalgias. Negative for back pain, joint pain and falls.  Skin: Negative for itching and rash.  Neurological: Positive for sensory change. Negative for dizziness, tingling, tremors, speech change, focal weakness, seizures, weakness and headaches.  Endo/Heme/Allergies: Bruises/bleeds easily.  Psychiatric/Behavioral: Negative.      PAST MEDICAL HISTORY: Past Medical History  Diagnosis Date  . Pneumonia 10/2002; 2010    Required INTUBATION for streptococcal pneumonia/septicemia (10/2002)  . HIV disease DX: 01/2002  . Fibroid uterus   . 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 Peripheral neuropathy     resolved.   . Multiple myeloma 01/2013    01/11/2013 cytogenetics showed 59, XX, +2, +4, +5, +7, add(7)(p22), +, +9, +11, der(11) t(1;11)(q11; q24), +12, +15, -16, +17, +19, +20, +mar1, +  mar2[cp6] / 38, XX [17].   Myeloma FISH showed  presence of +11, and +17.     PAST SURGICAL HISTORY: Past Surgical History  Procedure Laterality Date  . No past surgeries      FAMILY HISTORY Family History  Problem Relation Age of Onset  . Hypertension Mother    HEALTH MAINTENANCE: History  Substance Use Topics  . Smoking status: Never Smoker   . Smokeless tobacco: Never Used  . Alcohol Use: No   Allergies  Allergen Reactions  . Aspirin     Upset stomach   . Efavirenz Hives and Rash    (brand name sustiva)    Current Outpatient Prescriptions  Medication Sig Dispense Refill  . abacavir-lamiVUDine (EPZICOM) 600-300 MG per tablet Take 1 tablet by mouth daily.  30 tablet  11  . acyclovir (ZOVIRAX) 400 MG tablet Take 0.5 tablets (200 mg total) by mouth daily.  60 tablet  3  . dexamethasone (DECADRON) 4 MG tablet Take 10 tablets (40 mg total) by mouth once a week.  200 tablet  2  . dolutegravir (TIVICAY) 50 MG tablet Take 1 tablet (50 mg total) by mouth daily.  30 tablet  11  . lenalidomide (REVLIMID) 15 MG capsule Take 1 capsule (15 mg total) by mouth daily. Rest 7 days and repeat  21 capsule  0  . lidocaine-prilocaine (EMLA) cream Apply topically as needed. Apply to portacath one hour before chemo access or blood draw.  30 g  3  . prochlorperazine (COMPAZINE) 10 MG tablet Take 1 tablet (10 mg total) by mouth every 6 (six) hours as needed (Nausea or vomiting).  30 tablet  1  . promethazine (PHENERGAN) 25 MG tablet Take 1 tablet (25 mg total) by mouth every 6 (six) hours as needed for nausea.  30 tablet  0  . sodium bicarbonate 650 MG tablet Take 1 tablet (650 mg total) by mouth 3 (three) times daily.  90 tablet  3  . warfarin (COUMADIN) 5 MG tablet Take 1.5 tablets (7.5 mg total) by mouth daily. 7.5 mg on Mon, Wed, Fri & Sun. 10 mg on Tues, Thurs & Sat.  Or as Directed by MD.  48 tablet  3   No current facility-administered medications for this visit.   Facility-Administered Medications Ordered in Other Visits  Medication  Dose Route Frequency Provider Last Rate Last Dose  . 0.9 %  sodium chloride infusion   Intravenous Once Exie Parody, MD      . sodium chloride 0.9 % injection 10 mL  10 mL Intracatheter PRN Exie Parody, MD   10 mL at 06/21/13 1616    OBJECTIVE: Filed Vitals:   06/21/13 1417  BP: 144/86  Pulse: 78  Temp: 97.6 F (36.4 C)  Resp: 20     Body mass index is 27.42 kg/(m^2).    ECOG FS:0 PHYSICAL EXAMINATION:  HEENT: Sclerae anicteric.  Conjunctivae were pink. Pupils round and reactive bilaterally. Oral mucosa is moist without ulceration or thrush. No occipital, submandibular, cervical, supraclavicular or axillar adenopathy. Lungs: clear to auscultation without wheezes. No rales or rhonchi. Heart: regular rate and rhythm. No murmur, gallop or rubs. Abdomen: soft, non tender. No guarding or rebound tenderness. Bowel sounds are present. No palpable hepatosplenomegaly. MSK: no focal spinal tenderness. Extremities: No clubbing or cyanosis.No calf tenderness to palpitation, no peripheral edema. The patient had grossly intact strength in upper and lower extremities. Skin exam was without ecchymosis, petechiae. Neuro: non-focal, alert and oriented to time,  person and place, appropriate affect  LAB RESULTS:  CMP     Component Value Date/Time   NA 141 06/21/2013 1506   NA 139 05/02/2013 1412   K 4.0 06/21/2013 1506   K 4.1 05/02/2013 1412   CL 109 05/02/2013 1412   CL 111* 03/29/2013 1351   CO2 23 06/21/2013 1506   CO2 22 05/02/2013 1412   GLUCOSE 117 06/21/2013 1506   GLUCOSE 73 05/02/2013 1412   GLUCOSE 129* 03/29/2013 1351   BUN 11.7 06/21/2013 1506   BUN 14 05/02/2013 1412   CREATININE 0.9 06/21/2013 1506   CREATININE 1.00 05/02/2013 1412   CREATININE 4.01* 02/04/2013 0850   CALCIUM 8.4 06/21/2013 1506   CALCIUM 8.4 05/02/2013 1412   PROT 6.3* 05/24/2013 1217   PROT 6.1 05/02/2013 1412   ALBUMIN 3.3* 05/24/2013 1217   ALBUMIN 3.5 05/02/2013 1412   AST 22 05/24/2013 1217   AST 23 05/02/2013 1412   ALT 20  05/24/2013 1217   ALT 28 05/02/2013 1412   ALKPHOS 138 05/24/2013 1217   ALKPHOS 154* 05/02/2013 1412   BILITOT 0.39 05/24/2013 1217   BILITOT 0.4 05/02/2013 1412   GFRNONAA 12* 02/04/2013 0850   GFRAA 14* 02/04/2013 0850    Lab Results  Component Value Date   WBC 4.1 06/21/2013   NEUTROABS 2.8 06/21/2013   HGB 10.2* 06/21/2013   HCT 30.7* 06/21/2013   MCV 99.9 06/21/2013   PLT 223 06/21/2013      Chemistry      Component Value Date/Time   NA 141 06/21/2013 1506   NA 139 05/02/2013 1412   K 4.0 06/21/2013 1506   K 4.1 05/02/2013 1412   CL 109 05/02/2013 1412   CL 111* 03/29/2013 1351   CO2 23 06/21/2013 1506   CO2 22 05/02/2013 1412   BUN 11.7 06/21/2013 1506   BUN 14 05/02/2013 1412   CREATININE 0.9 06/21/2013 1506   CREATININE 1.00 05/02/2013 1412   CREATININE 4.01* 02/04/2013 0850      Component Value Date/Time   CALCIUM 8.4 06/21/2013 1506   CALCIUM 8.4 05/02/2013 1412   ALKPHOS 138 05/24/2013 1217   ALKPHOS 154* 05/02/2013 1412   AST 22 05/24/2013 1217   AST 23 05/02/2013 1412   ALT 20 05/24/2013 1217   ALT 28 05/02/2013 1412   BILITOT 0.39 05/24/2013 1217   BILITOT 0.4 05/02/2013 1412       Recent Labs Lab 06/21/13 1505  INR 2.32*    Urinalysis    Component Value Date/Time   COLORURINE YELLOW 02/07/2013 1226   APPEARANCEUR CLEAR 02/07/2013 1226   LABSPEC 1.016 02/07/2013 1226   PHURINE 7.5 02/07/2013 1226   GLUCOSEU NEG 02/07/2013 1226   HGBUR LARGE* 02/07/2013 1226   BILIRUBINUR NEG 02/07/2013 1226   KETONESUR NEG 02/07/2013 1226   PROTEINUR 100* 02/07/2013 1226   UROBILINOGEN 0.2 02/07/2013 1226   NITRITE NEG 02/07/2013 1226   LEUKOCYTESUR TRACE* 02/07/2013 1226    STUDIES: No results found.  ASSESSMENT AND PLAN: 1. Multiple myeloma:  - She has good response  on Revlimid/Dex/Velcade.  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 She will continue with weekly subcutaneous Velcade at the reduced dose of 1.0 mg/m2 per Dr. Roanna Raider. Patient discussed with Dr. Kathrene Alu. -  Continue Acyclovir.  2. 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 She has not had side effect.  3. Follow up: 2 weeks with Dr. Bertis Ruddy.   4. Her INR is therapeutic today at 2.32- she is  to continue her Coumadin at the current dose and we will check another PT/INR in one week and again When she sees Dr. Bertis Ruddy, who can and make the decision as to whether she once to manage the patient's Coumadin herself or refer her to our in-house Coumadin clinic   Conni Slipper, PA-C   06/13/2013 5:16 PM

## 2013-06-21 NOTE — Telephone Encounter (Signed)
gv pt appt schedule for September thru October.

## 2013-06-21 NOTE — Patient Instructions (Addendum)
[facility] Discharge Instructions for Patients Receiving Chemotherapy  Today you received the following chemotherapy agents: Velcade.  To help prevent nausea and vomiting after your treatment, we encourage you to take your nausea medication as prescribed.   If you develop nausea and vomiting that is not controlled by your nausea medication, call the clinic.   BELOW ARE SYMPTOMS THAT SHOULD BE REPORTED IMMEDIATELY:  *FEVER GREATER THAN 100.5 F  *CHILLS WITH OR WITHOUT FEVER  NAUSEA AND VOMITING THAT IS NOT CONTROLLED WITH YOUR NAUSEA MEDICATION  *UNUSUAL SHORTNESS OF BREATH  *UNUSUAL BRUISING OR BLEEDING  TENDERNESS IN MOUTH AND THROAT WITH OR WITHOUT PRESENCE OF ULCERS  *URINARY PROBLEMS  *BOWEL PROBLEMS  UNUSUAL RASH Items with * indicate a potential emergency and should be followed up as soon as possible.  Feel free to call the clinic you have any questions or concerns. The clinic phone number is [phone].    

## 2013-06-21 NOTE — Patient Instructions (Signed)
Continue Coumadin at your current dose Continue weekly labs and chemotherapy as scheduled Followup in 2 weeks with Dr. Bertis Ruddy

## 2013-06-24 ENCOUNTER — Telehealth: Payer: Self-pay | Admitting: *Deleted

## 2013-06-24 NOTE — Telephone Encounter (Signed)
Per Tiana Loft PA, called patient and instructed her to take 5mg  of coumadin on Monday and 7.5 mg all other days. Patient verbalized understanding.

## 2013-06-28 ENCOUNTER — Other Ambulatory Visit (HOSPITAL_BASED_OUTPATIENT_CLINIC_OR_DEPARTMENT_OTHER): Payer: Self-pay

## 2013-06-28 ENCOUNTER — Ambulatory Visit (HOSPITAL_BASED_OUTPATIENT_CLINIC_OR_DEPARTMENT_OTHER): Payer: Self-pay

## 2013-06-28 ENCOUNTER — Other Ambulatory Visit: Payer: Self-pay | Admitting: Oncology

## 2013-06-28 ENCOUNTER — Telehealth: Payer: Self-pay | Admitting: *Deleted

## 2013-06-28 VITALS — BP 154/87 | HR 80 | Temp 98.4°F | Resp 20

## 2013-06-28 DIAGNOSIS — C9000 Multiple myeloma not having achieved remission: Secondary | ICD-10-CM

## 2013-06-28 DIAGNOSIS — D4989 Neoplasm of unspecified behavior of other specified sites: Secondary | ICD-10-CM

## 2013-06-28 DIAGNOSIS — Z5112 Encounter for antineoplastic immunotherapy: Secondary | ICD-10-CM

## 2013-06-28 LAB — CBC WITH DIFFERENTIAL/PLATELET
BASO%: 0.4 % (ref 0.0–2.0)
EOS%: 2 % (ref 0.0–7.0)
HCT: 34.6 % — ABNORMAL LOW (ref 34.8–46.6)
LYMPH%: 21.1 % (ref 14.0–49.7)
MCH: 32.8 pg (ref 25.1–34.0)
MCHC: 33 g/dL (ref 31.5–36.0)
MONO#: 0.5 10*3/uL (ref 0.1–0.9)
NEUT%: 68.8 % (ref 38.4–76.8)
Platelets: 257 10*3/uL (ref 145–400)
RBC: 3.48 10*6/uL — ABNORMAL LOW (ref 3.70–5.45)
WBC: 6.4 10*3/uL (ref 3.9–10.3)

## 2013-06-28 LAB — BASIC METABOLIC PANEL (CC13)
CO2: 23 mEq/L (ref 22–29)
Calcium: 9.2 mg/dL (ref 8.4–10.4)
Creatinine: 1 mg/dL (ref 0.6–1.1)
Sodium: 142 mEq/L (ref 136–145)

## 2013-06-28 LAB — PROTHROMBIN TIME
INR: 2.18 — ABNORMAL HIGH (ref <1.50)
Prothrombin Time: 23.6 seconds — ABNORMAL HIGH (ref 11.6–15.2)

## 2013-06-28 MED ORDER — ONDANSETRON HCL 8 MG PO TABS
ORAL_TABLET | ORAL | Status: AC
  Filled 2013-06-28: qty 1

## 2013-06-28 MED ORDER — BORTEZOMIB CHEMO SQ INJECTION 3.5 MG (2.5MG/ML)
1.0000 mg/m2 | Freq: Once | INTRAMUSCULAR | Status: AC
  Administered 2013-06-28: 1.75 mg via SUBCUTANEOUS
  Filled 2013-06-28: qty 1.75

## 2013-06-28 MED ORDER — HEPARIN SOD (PORK) LOCK FLUSH 100 UNIT/ML IV SOLN
500.0000 [IU] | Freq: Once | INTRAVENOUS | Status: AC
  Administered 2013-06-28: 500 [IU] via INTRAVENOUS
  Filled 2013-06-28: qty 5

## 2013-06-28 MED ORDER — ONDANSETRON HCL 8 MG PO TABS
8.0000 mg | ORAL_TABLET | Freq: Once | ORAL | Status: AC
  Administered 2013-06-28: 8 mg via ORAL

## 2013-06-28 MED ORDER — SODIUM CHLORIDE 0.9 % IJ SOLN
10.0000 mL | INTRAMUSCULAR | Status: DC | PRN
  Administered 2013-06-28: 10 mL via INTRAVENOUS
  Filled 2013-06-28: qty 10

## 2013-06-28 NOTE — Telephone Encounter (Signed)
Spoke w/ pt in lobby. She states Jennifer Boyle reviewed her INR today and instructed her to continue on same dose of Coumadin.

## 2013-06-28 NOTE — Patient Instructions (Addendum)
[facility] Discharge Instructions for Patients Receiving Chemotherapy  Today you received the following chemotherapy agents:  Velcade  To help prevent nausea and vomiting after your treatment, we encourage you to take your nausea medication as ordered per MD.   If you develop nausea and vomiting that is not controlled by your nausea medication, call the clinic.   BELOW ARE SYMPTOMS THAT SHOULD BE REPORTED IMMEDIATELY:  *FEVER GREATER THAN 100.5 F  *CHILLS WITH OR WITHOUT FEVER  NAUSEA AND VOMITING THAT IS NOT CONTROLLED WITH YOUR NAUSEA MEDICATION  *UNUSUAL SHORTNESS OF BREATH  *UNUSUAL BRUISING OR BLEEDING  TENDERNESS IN MOUTH AND THROAT WITH OR WITHOUT PRESENCE OF ULCERS  *URINARY PROBLEMS  *BOWEL PROBLEMS  UNUSUAL RASH Items with * indicate a potential emergency and should be followed up as soon as possible.  Feel free to call the clinic you have any questions or concerns. The clinic phone number is [phone].    

## 2013-06-30 ENCOUNTER — Other Ambulatory Visit: Payer: Self-pay | Admitting: Hematology and Oncology

## 2013-07-02 ENCOUNTER — Telehealth: Payer: Self-pay | Admitting: Hematology and Oncology

## 2013-07-02 ENCOUNTER — Ambulatory Visit (HOSPITAL_COMMUNITY)
Admission: RE | Admit: 2013-07-02 | Discharge: 2013-07-02 | Disposition: A | Discharge status: Home / Self Care | Payer: No Typology Code available for payment source | Source: Ambulatory Visit | Attending: Infectious Disease | Admitting: Infectious Disease

## 2013-07-02 DIAGNOSIS — Z0000 Encounter for general adult medical examination without abnormal findings: Secondary | ICD-10-CM

## 2013-07-02 DIAGNOSIS — Z1231 Encounter for screening mammogram for malignant neoplasm of breast: Secondary | ICD-10-CM | POA: Insufficient documentation

## 2013-07-02 NOTE — Telephone Encounter (Signed)
, °

## 2013-07-03 ENCOUNTER — Telehealth: Payer: Self-pay | Admitting: *Deleted

## 2013-07-03 DIAGNOSIS — D4989 Neoplasm of unspecified behavior of other specified sites: Secondary | ICD-10-CM

## 2013-07-03 NOTE — Telephone Encounter (Signed)
Refill request to collaborative nurse desk to hold pending urine pregnancy test results scheduled for 9/25 at 3pm before authorization and refill can be ordered.

## 2013-07-04 ENCOUNTER — Ambulatory Visit: Payer: Self-pay | Admitting: Hematology and Oncology

## 2013-07-04 ENCOUNTER — Other Ambulatory Visit: Payer: Self-pay | Admitting: Lab

## 2013-07-04 ENCOUNTER — Other Ambulatory Visit: Payer: No Typology Code available for payment source

## 2013-07-04 DIAGNOSIS — C9000 Multiple myeloma not having achieved remission: Secondary | ICD-10-CM

## 2013-07-05 ENCOUNTER — Ambulatory Visit: Payer: Self-pay

## 2013-07-05 ENCOUNTER — Other Ambulatory Visit: Payer: Self-pay | Admitting: *Deleted

## 2013-07-05 LAB — PREGNANCY, URINE: Preg Test, Ur: NEGATIVE

## 2013-07-05 NOTE — Telephone Encounter (Signed)
THIS REFILL REQUEST FOR REVLIMID WAS GIVEN TO DR.GORSUCH'S NURSE, NATALIE STROUD,RN.

## 2013-07-08 ENCOUNTER — Telehealth: Payer: Self-pay | Admitting: *Deleted

## 2013-07-08 ENCOUNTER — Telehealth: Payer: Self-pay | Admitting: Oncology

## 2013-07-08 ENCOUNTER — Telehealth: Payer: Self-pay | Admitting: Hematology and Oncology

## 2013-07-08 MED ORDER — LENALIDOMIDE 15 MG PO CAPS: 15.0000 mg | ORAL_CAPSULE | Freq: Every day | ORAL | Status: DC

## 2013-07-08 NOTE — Telephone Encounter (Signed)
Pt came by today wants appt for 10/10 r/s lab, flusha dn chemo, called nurse and left message regarding change, pt wants to see the MD right @ that moment , pt was not happy that she has to be notified, explained to her that MD will be the one to decide

## 2013-07-08 NOTE — Addendum Note (Signed)
Addended by: Arvilla Meres on: 07/08/2013 04:29 PM   Modules accepted: Orders

## 2013-07-08 NOTE — Telephone Encounter (Signed)
Pt called want to r/s md visit on January [address], emailed Dr. Truett Perna, for a n alternate MD spot , waiting for answer

## 2013-07-08 NOTE — Telephone Encounter (Signed)
Patient wants to change her lab and chemo appt from 07/19/13 to 07/18/13. Note to dr Bertis Ruddy

## 2013-07-10 ENCOUNTER — Telehealth: Payer: Self-pay | Admitting: *Deleted

## 2013-07-10 NOTE — Telephone Encounter (Signed)
Per staff message I have moved appts from 10/10 to 10/9

## 2013-07-10 NOTE — Telephone Encounter (Signed)
Fax from [facility] received-Revlimid shipped to pt 07/09/13

## 2013-07-11 ENCOUNTER — Telehealth: Payer: Self-pay | Admitting: Hematology and Oncology

## 2013-07-11 NOTE — Telephone Encounter (Signed)
Talked to pt and she is aware of chemo appt moved from 10/10 to 10/9 per her request

## 2013-07-12 ENCOUNTER — Encounter: Payer: Self-pay | Admitting: Hematology and Oncology

## 2013-07-12 ENCOUNTER — Other Ambulatory Visit (HOSPITAL_BASED_OUTPATIENT_CLINIC_OR_DEPARTMENT_OTHER): Payer: No Typology Code available for payment source

## 2013-07-12 ENCOUNTER — Other Ambulatory Visit: Payer: Self-pay | Admitting: Pharmacist

## 2013-07-12 ENCOUNTER — Ambulatory Visit: Payer: No Typology Code available for payment source

## 2013-07-12 ENCOUNTER — Other Ambulatory Visit: Payer: Self-pay | Admitting: Lab

## 2013-07-12 ENCOUNTER — Ambulatory Visit (HOSPITAL_BASED_OUTPATIENT_CLINIC_OR_DEPARTMENT_OTHER): Payer: No Typology Code available for payment source

## 2013-07-12 ENCOUNTER — Ambulatory Visit (HOSPITAL_BASED_OUTPATIENT_CLINIC_OR_DEPARTMENT_OTHER): Payer: No Typology Code available for payment source | Admitting: Hematology and Oncology

## 2013-07-12 ENCOUNTER — Ambulatory Visit: Payer: No Typology Code available for payment source | Admitting: Pharmacist

## 2013-07-12 ENCOUNTER — Other Ambulatory Visit: Payer: Self-pay | Admitting: Hematology and Oncology

## 2013-07-12 VITALS — BP 145/82 | HR 68 | Temp 98.0°F | Resp 18 | Ht 64.0 in | Wt 162.6 lb

## 2013-07-12 DIAGNOSIS — C9000 Multiple myeloma not having achieved remission: Secondary | ICD-10-CM

## 2013-07-12 DIAGNOSIS — G609 Hereditary and idiopathic neuropathy, unspecified: Secondary | ICD-10-CM

## 2013-07-12 DIAGNOSIS — Z5112 Encounter for antineoplastic immunotherapy: Secondary | ICD-10-CM

## 2013-07-12 DIAGNOSIS — D4989 Neoplasm of unspecified behavior of other specified sites: Secondary | ICD-10-CM

## 2013-07-12 DIAGNOSIS — B20 Human immunodeficiency virus [HIV] disease: Secondary | ICD-10-CM

## 2013-07-12 LAB — BASIC METABOLIC PANEL (CC13)
CO2: 25 mEq/L (ref 22–29)
Chloride: 111 mEq/L — ABNORMAL HIGH (ref 98–109)
Creatinine: 0.9 mg/dL (ref 0.6–1.1)
Glucose: 100 mg/dl (ref 70–140)
Potassium: 3.9 mEq/L (ref 3.5–5.1)
Sodium: 143 mEq/L (ref 136–145)

## 2013-07-12 LAB — PROTHROMBIN TIME: INR: 1.53 — ABNORMAL HIGH (ref <1.50)

## 2013-07-12 LAB — CBC WITH DIFFERENTIAL/PLATELET
BASO%: 0.4 % (ref 0.0–2.0)
HCT: 31.7 % — ABNORMAL LOW (ref 34.8–46.6)
MCHC: 33.7 g/dL (ref 31.5–36.0)
MCV: 99 fL (ref 79.5–101.0)
MONO#: 0.4 10*3/uL (ref 0.1–0.9)
NEUT%: 57 % (ref 38.4–76.8)
RBC: 3.2 10*6/uL — ABNORMAL LOW (ref 3.70–5.45)
RDW: 17.6 % — ABNORMAL HIGH (ref 11.2–14.5)
WBC: 5.4 10*3/uL (ref 3.9–10.3)
lymph#: 1.9 10*3/uL (ref 0.9–3.3)

## 2013-07-12 MED ORDER — ONDANSETRON HCL 8 MG PO TABS
8.0000 mg | ORAL_TABLET | Freq: Once | ORAL | Status: AC
  Administered 2013-07-12: 8 mg via ORAL

## 2013-07-12 MED ORDER — VITAMIN D 1000 UNITS PO TABS: 1000.0000 [IU] | ORAL_TABLET | Freq: Every day | ORAL | Status: AC

## 2013-07-12 MED ORDER — SODIUM CHLORIDE 0.9 % IJ SOLN
10.0000 mL | INTRAMUSCULAR | Status: DC | PRN
  Administered 2013-07-12: 10 mL via INTRAVENOUS
  Filled 2013-07-12: qty 10

## 2013-07-12 MED ORDER — HEPARIN SOD (PORK) LOCK FLUSH 100 UNIT/ML IV SOLN
500.0000 [IU] | Freq: Once | INTRAVENOUS | Status: DC
  Administered 2013-07-12: 500 [IU] via INTRAVENOUS
  Filled 2013-07-12: qty 5

## 2013-07-12 MED ORDER — BORTEZOMIB CHEMO SQ INJECTION 3.5 MG (2.5MG/ML)
1.0000 mg/m2 | Freq: Once | INTRAMUSCULAR | Status: AC
  Administered 2013-07-12: 1.75 mg via SUBCUTANEOUS
  Filled 2013-07-12: qty 1.75

## 2013-07-12 MED ORDER — ONDANSETRON HCL 8 MG PO TABS
ORAL_TABLET | ORAL | Status: AC
  Filled 2013-07-12: qty 1

## 2013-07-12 NOTE — Patient Instructions (Signed)
[facility] Discharge Instructions for Patients Receiving Chemotherapy  Today you received the following chemotherapy agents Velcade   To help prevent nausea and vomiting after your treatment, we encourage you to take your nausea medication Compazine 10 mg every 6 hours as needed.   If you develop nausea and vomiting that is not controlled by your nausea medication, call the clinic.   BELOW ARE SYMPTOMS THAT SHOULD BE REPORTED IMMEDIATELY:  *FEVER GREATER THAN 100.5 F  *CHILLS WITH OR WITHOUT FEVER  NAUSEA AND VOMITING THAT IS NOT CONTROLLED WITH YOUR NAUSEA MEDICATION  *UNUSUAL SHORTNESS OF BREATH  *UNUSUAL BRUISING OR BLEEDING  TENDERNESS IN MOUTH AND THROAT WITH OR WITHOUT PRESENCE OF ULCERS  *URINARY PROBLEMS  *BOWEL PROBLEMS  UNUSUAL RASH Items with * indicate a potential emergency and should be followed up as soon as possible.  Feel free to call the clinic you have any questions or concerns. The clinic phone number is [phone].    

## 2013-07-12 NOTE — Progress Notes (Signed)
Received referral from Dr. Bertis Ruddy to see patient in anticoag clinic.  Patient currently taking Coumadin 7.5mg  daily except for 5mg  on Monday as prophylaxis while on revlimid therapy.  Called patient and instructed her to continue her current dosage.  Her INR today was 1.53.  We will recheck her INR on 07/18/13.  She has labs scheduled at 8:15 and infusion at 9:30 am.  We will see her in the infusion room.  This was a NO CHARGE telephone encounter.  Lillia Pauls, PharmD Clinical Pharmacist 07/12/2013 4:48 PM

## 2013-07-12 NOTE — Progress Notes (Signed)
[facility] OFFICE PROGRESS NOTE  [name], MD  DIAGNOSIS: IgG kappa multiple myeloma  SUMMARY OF ONCOLOGIC HISTORY: This is a very pleasant 47 year old lady who presented with anemia, acute renal failure, and bony abnormalities. Bone marrow aspirate and biopsy in April 2014 show 40% plasma cell involvement and an M spike of over 5 g. She has complex cytogenetics on the bone marrow. The patient was initially treated with Velcade, dexamethasone in April 2014. In May of 2014 Zometa was started after she obtained dental clearance. Revlimid was subsequently added when her kidney function so to normalize  INTERVAL HISTORY: Jennifer Boyle 48 y.o. female returns for further followup related to her history of multiple myeloma. 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 Most recently, she felt that there neuropathy might be a bit worse due to Velcade. If her last treatment, the dose of Velcade has been reduced. She is currently on Coumadin therapy as a form of prophylaxis against DVT while on Revlimid. The patient cannot tolerate aspirin and it was felt safe for her to be on Coumadin. Her Coumadin therapy consists of 7.5 mg daily except 5 mg on Mondays. Her INR has been quite stable and she denies any bleeding complications such as epistaxis, hematuria, or hematochezia. She also had a problem with constipation resolved with Colace and laxatives such as MiraLax. She complained of chronic musculoskeletal pain especially back pain recently. Denies any recent fevers, chills, or cough. No new dental problems since started on Zometa.  I have reviewed the past medical history, past surgical history, social history and family history with the patient and they are unchanged from previous note.  ALLERGIES:  is allergic to aspirin and  efavirenz.  MEDICATIONS: Current outpatient prescriptions:abacavir-lamiVUDine (EPZICOM) 600-300 MG per tablet, Take 1 tablet by mouth daily., Disp: 30 tablet, Rfl: 11;  acyclovir (ZOVIRAX) 400 MG tablet, Take 0.5 tablets (200 mg total) by mouth daily., Disp: 60 tablet, Rfl: 3;  dexamethasone (DECADRON) 4 MG tablet, Take 10 tablets (40 mg total) by mouth once a week., Disp: 200 tablet, Rfl: 2 dolutegravir (TIVICAY) 50 MG tablet, Take 1 tablet (50 mg total) by mouth daily., Disp: 30 tablet, Rfl: 11;  lenalidomide (REVLIMID) 15 MG capsule, Take 1 capsule (15 mg total) by mouth daily. Rest 7 days and repeat, Disp: 21 capsule, Rfl: 0;  lidocaine-prilocaine (EMLA) cream, Apply topically as needed. Apply to portacath one hour before chemo access or blood draw., Disp: 30 g, Rfl: 3 prochlorperazine (COMPAZINE) 10 MG tablet, Take 1 tablet (10 mg total) by mouth every 6 (six) hours as needed (Nausea or vomiting)., Disp: 30 tablet, Rfl: 1;  promethazine (PHENERGAN) 25 MG tablet, Take 1 tablet (25 mg total) by mouth every 6 (six) hours as needed for nausea., Disp: 30 tablet, Rfl: 0;  sodium bicarbonate 650 MG tablet, Take 1 tablet (650 mg total) by mouth 3 (three) times daily., Disp: 90 tablet, Rfl: 3 warfarin (COUMADIN) 5 MG tablet, Take 1.5 tablets (7.5 mg total) by mouth daily. 7.5 mg on Mon, Wed, Fri & Sun. 10 mg on Tues, Thurs & Sat.  Or as Directed by MD., Disp: 48 tablet, Rfl: 3;  cholecalciferol (VITAMIN D) 1000 UNITS tablet, Take 1 tablet (1,000 Units total) by mouth daily., Disp: 30 tablet, Rfl: 3 No current facility-administered medications for this visit. Facility-Administered Medications Ordered in Other Visits: bortezomib  SQ (VELCADE) chemo injection 1.75 mg, 1 mg/m2 (Treatment Plan Actual), Subcutaneous, Once, Artis Delay, MD  REVIEW OF SYSTEMS:   Constitutional: Denies fevers, chills or abnormal weight loss Eyes: Denies blurriness of vision Ears, nose, mouth, throat, and face: Denies mucositis or sore  throat Respiratory: Denies cough, dyspnea or wheezes Cardiovascular: Denies palpitation, chest discomfort or lower extremity swelling Gastrointestinal:  Denies nausea, heartburn or change in bowel habits Skin: Denies abnormal skin rashes Lymphatics: Denies new lymphadenopathy or easy bruising Neurological:Denies numbness, tingling or new weaknesses Behavioral/Psych: Mood is stable, no new changes  All other systems were reviewed with the patient and are negative.  PHYSICAL EXAMINATION: ECOG PERFORMANCE STATUS: 1 - Symptomatic but completely ambulatory  Filed Vitals:   07/12/13 1318  BP: 145/82  Pulse: 68  Temp: 98 F (36.7 C)  Resp: 18   Filed Weights   07/12/13 1318  Weight: 162 lb 9.6 oz (73.755 kg)    GENERAL:alert, no distress and comfortable SKIN: skin color, texture, turgor are normal, no rashes or significant lesions EYES: normal, Conjunctiva are pink and non-injected, sclera clear OROPHARYNX:no exudate, no erythema and lips, buccal mucosa, and tongue normal  NECK: supple, thyroid normal size, non-tender, without nodularity LYMPH:  no palpable lymphadenopathy in the cervical, axillary or inguinal LUNGS: clear to auscultation and percussion with normal breathing effort HEART: regular rate & rhythm and no murmurs and no lower extremity edema ABDOMEN:abdomen soft, non-tender and normal bowel sounds Musculoskeletal:no cyanosis of digits and no clubbing  NEURO: alert & oriented x 3 with fluent speech, no focal motor/sensory deficits  LABORATORY DATA:  I have reviewed the data as listed    Component Value Date/Time   NA 143 07/12/2013 1210   NA 139 05/02/2013 1412   K 3.9 07/12/2013 1210   K 4.1 05/02/2013 1412   CL 109 05/02/2013 1412   CL 111* 03/29/2013 1351   CO2 25 07/12/2013 1210   CO2 22 05/02/2013 1412   GLUCOSE 100 07/12/2013 1210   GLUCOSE 73 05/02/2013 1412   GLUCOSE 129* 03/29/2013 1351   BUN 18.6 07/12/2013 1210   BUN 14 05/02/2013 1412   CREATININE 0.9  07/12/2013 1210   CREATININE 1.00 05/02/2013 1412   CREATININE 4.01* 02/04/2013 0850   CALCIUM 9.4 07/12/2013 1210   CALCIUM 8.4 05/02/2013 1412   PROT 6.3* 05/24/2013 1217   PROT 6.1 05/02/2013 1412   ALBUMIN 3.3* 05/24/2013 1217   ALBUMIN 3.5 05/02/2013 1412   AST 22 05/24/2013 1217   AST 23 05/02/2013 1412   ALT 20 05/24/2013 1217   ALT 28 05/02/2013 1412   ALKPHOS 138 05/24/2013 1217   ALKPHOS 154* 05/02/2013 1412   BILITOT 0.39 05/24/2013 1217   BILITOT 0.4 05/02/2013 1412   GFRNONAA 12* 02/04/2013 0850   GFRAA 14* 02/04/2013 0850    No results found for this basename: SPEP, UPEP,  kappa and lambda light chains    Lab Results  Component Value Date   WBC 5.4 07/12/2013   NEUTROABS 3.1 07/12/2013   HGB 10.7* 07/12/2013   HCT 31.7* 07/12/2013   MCV 99.0 07/12/2013   PLT 232 07/12/2013      Chemistry      Component Value Date/Time   NA 143 07/12/2013 1210   NA 139 05/02/2013 1412   K 3.9 07/12/2013 1210   K 4.1 05/02/2013 1412   CL 109 05/02/2013 1412   CL 111* 03/29/2013 1351   CO2 25 07/12/2013 1210   CO2 22 05/02/2013 1412  BUN 18.6 07/12/2013 1210   BUN 14 05/02/2013 1412   CREATININE 0.9 07/12/2013 1210   CREATININE 1.00 05/02/2013 1412   CREATININE 4.01* 02/04/2013 0850      Component Value Date/Time   CALCIUM 9.4 07/12/2013 1210   CALCIUM 8.4 05/02/2013 1412   ALKPHOS 138 05/24/2013 1217   ALKPHOS 154* 05/02/2013 1412   AST 22 05/24/2013 1217   AST 23 05/02/2013 1412   ALT 20 05/24/2013 1217   ALT 28 05/02/2013 1412   BILITOT 0.39 05/24/2013 1217   BILITOT 0.4 05/02/2013 1412      ASSESSMENT: Multiple myeloma, IgG kappa subtype and history of HIV infection  PLAN:  #1 multiple myeloma She is doing very well and responding to treatment very well. Due to worsening peripheral neuropathy, I recommend reducing the dose of Velcade to 1 mg per meter squared and continue at that level. The most of them to reduce the dexamethasone to 20 mg every week. We'll continue the same treatment along with  monthly Zometa. She needs to be evaluated for possible transplant. She is concerned about Therapist, occupational. We'll then refer her to a regional transplant center for further evaluation. #2 bone lesions She continue on Zometa. She had muscular skeletal pain which is suspected to osteomalacia. I recommend calcium and vitamin D supplements #3 DVT prophylaxis Number the refer her to the Coumadin clinic to manage her INR #4 HIV infection She will continue her retroviral medication. In addition she will also continue on acyclovir.  #5 peripheral neuropathy  this has been present since before the diagnosis of multiple myeloma. This is only grade 1-2. Hopefully we reducing the dose of Velcade it will continue to improve. I am holding off prescribing any medication for now.  All questions were answered. The patient knows to call the clinic with any problems, questions or concerns. We can certainly see the patient much sooner if necessary. No barriers to learning was detected.    [facility], Kameron Glazebrook, MD 07/12/2013 2:46 PM

## 2013-07-12 NOTE — Patient Instructions (Addendum)
Continue taking 7.5mg  of Coumadin daily except for 5mg  on Mondays. Recheck INR on 07/18/13; labs at 8:15 am.  We will see you in infusion.

## 2013-07-15 ENCOUNTER — Other Ambulatory Visit: Payer: Self-pay | Admitting: *Deleted

## 2013-07-15 DIAGNOSIS — C9000 Multiple myeloma not having achieved remission: Secondary | ICD-10-CM

## 2013-07-16 ENCOUNTER — Telehealth: Payer: Self-pay | Admitting: Hematology and Oncology

## 2013-07-16 NOTE — Telephone Encounter (Signed)
, °

## 2013-07-17 ENCOUNTER — Telehealth: Payer: Self-pay | Admitting: Hematology and Oncology

## 2013-07-17 NOTE — Telephone Encounter (Signed)
Pt cannot go to [facility] due to no insurance

## 2013-07-18 ENCOUNTER — Other Ambulatory Visit (HOSPITAL_BASED_OUTPATIENT_CLINIC_OR_DEPARTMENT_OTHER): Payer: No Typology Code available for payment source

## 2013-07-18 ENCOUNTER — Ambulatory Visit: Payer: No Typology Code available for payment source | Admitting: Pharmacist

## 2013-07-18 ENCOUNTER — Ambulatory Visit: Payer: Self-pay

## 2013-07-18 ENCOUNTER — Ambulatory Visit: Payer: No Typology Code available for payment source

## 2013-07-18 ENCOUNTER — Ambulatory Visit (HOSPITAL_BASED_OUTPATIENT_CLINIC_OR_DEPARTMENT_OTHER): Payer: No Typology Code available for payment source

## 2013-07-18 ENCOUNTER — Other Ambulatory Visit: Payer: Self-pay | Admitting: Hematology and Oncology

## 2013-07-18 VITALS — BP 146/91 | HR 76 | Temp 97.4°F

## 2013-07-18 DIAGNOSIS — C9000 Multiple myeloma not having achieved remission: Secondary | ICD-10-CM

## 2013-07-18 DIAGNOSIS — Z5112 Encounter for antineoplastic immunotherapy: Secondary | ICD-10-CM

## 2013-07-18 LAB — COMPREHENSIVE METABOLIC PANEL (CC13)
ALT: 18 U/L (ref 0–55)
AST: 17 U/L (ref 5–34)
Alkaline Phosphatase: 94 U/L (ref 40–150)
Anion Gap: 8 mEq/L (ref 3–11)
Chloride: 112 mEq/L — ABNORMAL HIGH (ref 98–109)
Creatinine: 1 mg/dL (ref 0.6–1.1)
Glucose: 93 mg/dl (ref 70–140)
Total Bilirubin: 0.29 mg/dL (ref 0.20–1.20)

## 2013-07-18 LAB — PROTHROMBIN TIME
INR: 1.41 (ref <1.50)
Prothrombin Time: 16.9 seconds — ABNORMAL HIGH (ref 11.6–15.2)

## 2013-07-18 LAB — CBC WITH DIFFERENTIAL/PLATELET
Basophils Absolute: 0 10*3/uL (ref 0.0–0.1)
EOS%: 5.9 % (ref 0.0–7.0)
HCT: 31.4 % — ABNORMAL LOW (ref 34.8–46.6)
HGB: 10.3 g/dL — ABNORMAL LOW (ref 11.6–15.9)
MCH: 32.5 pg (ref 25.1–34.0)
MCHC: 32.8 g/dL (ref 31.5–36.0)
MCV: 99.1 fL (ref 79.5–101.0)
MONO#: 0.9 10*3/uL (ref 0.1–0.9)
MONO%: 18.5 % — ABNORMAL HIGH (ref 0.0–14.0)
NEUT%: 38.7 % (ref 38.4–76.8)
Platelets: 247 10*3/uL (ref 145–400)
RDW: 16.8 % — ABNORMAL HIGH (ref 11.2–14.5)

## 2013-07-18 LAB — LACTATE DEHYDROGENASE (CC13): LDH: 215 U/L (ref 125–245)

## 2013-07-18 LAB — POCT INR: INR: 1.4

## 2013-07-18 MED ORDER — HEPARIN SOD (PORK) LOCK FLUSH 100 UNIT/ML IV SOLN
500.0000 [IU] | Freq: Once | INTRAVENOUS | Status: AC
  Administered 2013-07-18: 500 [IU] via INTRAVENOUS
  Filled 2013-07-18: qty 5

## 2013-07-18 MED ORDER — ZOLEDRONIC ACID 4 MG/100ML IV SOLN
4.0000 mg | Freq: Once | INTRAVENOUS | Status: AC
  Administered 2013-07-18: 4 mg via INTRAVENOUS
  Filled 2013-07-18: qty 100

## 2013-07-18 MED ORDER — HEPARIN SOD (PORK) LOCK FLUSH 100 UNIT/ML IV SOLN
250.0000 [IU] | Freq: Once | INTRAVENOUS | Status: DC | PRN
  Filled 2013-07-18: qty 5

## 2013-07-18 MED ORDER — SODIUM CHLORIDE 0.9 % IV SOLN
Freq: Once | INTRAVENOUS | Status: AC
  Administered 2013-07-18: 10:00:00 via INTRAVENOUS

## 2013-07-18 MED ORDER — ONDANSETRON HCL 8 MG PO TABS
8.0000 mg | ORAL_TABLET | Freq: Once | ORAL | Status: AC
  Administered 2013-07-18: 8 mg via ORAL

## 2013-07-18 MED ORDER — BORTEZOMIB CHEMO SQ INJECTION 3.5 MG (2.5MG/ML)
1.0000 mg/m2 | Freq: Once | INTRAMUSCULAR | Status: AC
  Administered 2013-07-18: 1.75 mg via SUBCUTANEOUS
  Filled 2013-07-18: qty 1.75

## 2013-07-18 MED ORDER — SODIUM CHLORIDE 0.9 % IJ SOLN
3.0000 mL | Freq: Once | INTRAMUSCULAR | Status: DC | PRN
  Filled 2013-07-18: qty 10

## 2013-07-18 MED ORDER — ONDANSETRON HCL 8 MG PO TABS
ORAL_TABLET | ORAL | Status: AC
  Filled 2013-07-18: qty 1

## 2013-07-18 MED ORDER — SODIUM CHLORIDE 0.9 % IJ SOLN
10.0000 mL | INTRAMUSCULAR | Status: DC | PRN
  Administered 2013-07-18: 10 mL via INTRAVENOUS
  Filled 2013-07-18: qty 10

## 2013-07-18 NOTE — Patient Instructions (Signed)
[facility] Discharge Instructions for Patients Receiving Chemotherapy  Today you received the following chemotherapy agents :  Velcade, Zometa.  To help prevent nausea and vomiting after your treatment, we encourage you to take your nausea medication as prescribed.   If you develop nausea and vomiting that is not controlled by your nausea medication, call the clinic.   BELOW ARE SYMPTOMS THAT SHOULD BE REPORTED IMMEDIATELY:  *FEVER GREATER THAN 100.5 F  *CHILLS WITH OR WITHOUT FEVER  NAUSEA AND VOMITING THAT IS NOT CONTROLLED WITH YOUR NAUSEA MEDICATION  *UNUSUAL SHORTNESS OF BREATH  *UNUSUAL BRUISING OR BLEEDING  TENDERNESS IN MOUTH AND THROAT WITH OR WITHOUT PRESENCE OF ULCERS  *URINARY PROBLEMS  *BOWEL PROBLEMS  UNUSUAL RASH Items with * indicate a potential emergency and should be followed up as soon as possible.  Feel free to call the clinic you have any questions or concerns. The clinic phone number is [phone].    

## 2013-07-18 NOTE — Progress Notes (Signed)
Duplicate appts. 

## 2013-07-18 NOTE — Progress Notes (Signed)
Pt new to our clinic but not new to coumadin.   She started in spring when she went on Revlimid INR=1.4 today She did eat spinach last night but claims she has not missed any doses. Take Coumadin 10 mg today then continue taking 7.5mg  of Coumadin daily.  Recheck INR on 07/26/13; labs at 1:15, flush at 1:30 and infusion at 2:00.  We will see patient during infusion

## 2013-07-18 NOTE — Patient Instructions (Signed)
Take Coumadin 10 mg today then continue taking 7.5mg  of Coumadin daily. Recheck INR on 07/26/13; labs at 1:15 am.  We will see you in infusion

## 2013-07-19 ENCOUNTER — Other Ambulatory Visit: Payer: Self-pay | Admitting: Lab

## 2013-07-19 ENCOUNTER — Ambulatory Visit: Payer: Self-pay

## 2013-07-22 ENCOUNTER — Other Ambulatory Visit: Payer: Self-pay | Admitting: Certified Registered Nurse Anesthetist

## 2013-07-22 LAB — IMMUNOFIXATION ELECTROPHORESIS
IgA: 46 mg/dL — ABNORMAL LOW (ref 69–380)
IgM, Serum: 15 mg/dL — ABNORMAL LOW (ref 52–322)

## 2013-07-22 LAB — KAPPA/LAMBDA LIGHT CHAINS
Kappa:Lambda Ratio: 11.05 — ABNORMAL HIGH (ref 0.26–1.65)
Lambda Free Lght Chn: 1.52 mg/dL (ref 0.57–2.63)

## 2013-07-26 ENCOUNTER — Ambulatory Visit (HOSPITAL_BASED_OUTPATIENT_CLINIC_OR_DEPARTMENT_OTHER): Payer: Self-pay | Admitting: Pharmacist

## 2013-07-26 ENCOUNTER — Other Ambulatory Visit: Payer: Self-pay | Admitting: Hematology and Oncology

## 2013-07-26 ENCOUNTER — Ambulatory Visit (HOSPITAL_BASED_OUTPATIENT_CLINIC_OR_DEPARTMENT_OTHER): Payer: No Typology Code available for payment source

## 2013-07-26 ENCOUNTER — Other Ambulatory Visit: Payer: Self-pay | Admitting: *Deleted

## 2013-07-26 ENCOUNTER — Telehealth: Payer: Self-pay | Admitting: *Deleted

## 2013-07-26 ENCOUNTER — Other Ambulatory Visit (HOSPITAL_BASED_OUTPATIENT_CLINIC_OR_DEPARTMENT_OTHER): Payer: No Typology Code available for payment source | Admitting: Lab

## 2013-07-26 ENCOUNTER — Encounter: Payer: Self-pay | Admitting: Hematology and Oncology

## 2013-07-26 VITALS — BP 142/78 | HR 78 | Temp 98.1°F | Resp 18

## 2013-07-26 DIAGNOSIS — C9000 Multiple myeloma not having achieved remission: Secondary | ICD-10-CM

## 2013-07-26 DIAGNOSIS — Z452 Encounter for adjustment and management of vascular access device: Secondary | ICD-10-CM

## 2013-07-26 DIAGNOSIS — Z5112 Encounter for antineoplastic immunotherapy: Secondary | ICD-10-CM

## 2013-07-26 DIAGNOSIS — D4989 Neoplasm of unspecified behavior of other specified sites: Secondary | ICD-10-CM

## 2013-07-26 LAB — CBC WITH DIFFERENTIAL/PLATELET
Basophils Absolute: 0 10*3/uL (ref 0.0–0.1)
EOS%: 2.3 % (ref 0.0–7.0)
Eosinophils Absolute: 0.2 10*3/uL (ref 0.0–0.5)
HCT: 32.6 % — ABNORMAL LOW (ref 34.8–46.6)
HGB: 10.8 g/dL — ABNORMAL LOW (ref 11.6–15.9)
LYMPH%: 22.3 % (ref 14.0–49.7)
MCH: 33.3 pg (ref 25.1–34.0)
MCHC: 33.3 g/dL (ref 31.5–36.0)
MCV: 100.1 fL (ref 79.5–101.0)
MONO%: 10.2 % (ref 0.0–14.0)
NEUT#: 4.4 10*3/uL (ref 1.5–6.5)
RBC: 3.26 10*6/uL — ABNORMAL LOW (ref 3.70–5.45)
RDW: 17.5 % — ABNORMAL HIGH (ref 11.2–14.5)
lymph#: 1.5 10*3/uL (ref 0.9–3.3)

## 2013-07-26 LAB — COMPREHENSIVE METABOLIC PANEL (CC13)
ALT: 25 U/L (ref 0–55)
AST: 22 U/L (ref 5–34)
Albumin: 3.2 g/dL — ABNORMAL LOW (ref 3.5–5.0)
Alkaline Phosphatase: 104 U/L (ref 40–150)
BUN: 21 mg/dL (ref 7.0–26.0)
Calcium: 9 mg/dL (ref 8.4–10.4)
Chloride: 112 mEq/L — ABNORMAL HIGH (ref 98–109)
Creatinine: 0.9 mg/dL (ref 0.6–1.1)
Potassium: 4.3 mEq/L (ref 3.5–5.1)
Total Protein: 5.9 g/dL — ABNORMAL LOW (ref 6.4–8.3)

## 2013-07-26 LAB — PROTHROMBIN TIME
INR: 1.62 — ABNORMAL HIGH (ref <1.50)
Prothrombin Time: 18.8 seconds — ABNORMAL HIGH (ref 11.6–15.2)

## 2013-07-26 MED ORDER — SODIUM CHLORIDE 0.9 % IJ SOLN
10.0000 mL | INTRAMUSCULAR | Status: DC | PRN
  Administered 2013-07-26: 10 mL via INTRAVENOUS
  Filled 2013-07-26: qty 10

## 2013-07-26 MED ORDER — ONDANSETRON HCL 8 MG PO TABS
ORAL_TABLET | ORAL | Status: AC
  Filled 2013-07-26: qty 1

## 2013-07-26 MED ORDER — ONDANSETRON HCL 8 MG PO TABS
8.0000 mg | ORAL_TABLET | Freq: Once | ORAL | Status: AC
  Administered 2013-07-26: 8 mg via ORAL

## 2013-07-26 MED ORDER — BORTEZOMIB CHEMO SQ INJECTION 3.5 MG (2.5MG/ML)
1.0000 mg/m2 | Freq: Once | INTRAMUSCULAR | Status: AC
  Administered 2013-07-26: 1.75 mg via SUBCUTANEOUS
  Filled 2013-07-26: qty 1.75

## 2013-07-26 MED ORDER — HEPARIN SOD (PORK) LOCK FLUSH 100 UNIT/ML IV SOLN
500.0000 [IU] | Freq: Once | INTRAVENOUS | Status: AC
  Administered 2013-07-26: 500 [IU] via INTRAVENOUS
  Filled 2013-07-26: qty 5

## 2013-07-26 NOTE — Progress Notes (Signed)
INR remains below goal of 2-3 for DVT prophylaxis on Revlimid.  No changes in meds.  No bleeding or bruising.  Will increase coumadin to 10mg  M/F and 7.5mg  other days.  Check PT/INR on 08/08/13 with next MD appt.

## 2013-07-26 NOTE — Progress Notes (Signed)
Discharged at 1535 alone, ambulatory en rout to a 3:30 pm appointment at [facility].  Denies need for me to call to notify anyone she is en route.

## 2013-07-26 NOTE — Patient Instructions (Addendum)
[facility] Discharge Instructions for Patients Receiving Chemotherapy  Today you received the following chemotherapy agents Velcade  To help prevent nausea and vomiting after your treatment, we encourage you to take your nausea medication Other Phenergan or compazine.   If you develop nausea and vomiting that is not controlled by your nausea medication, call the clinic.   BELOW ARE SYMPTOMS THAT SHOULD BE REPORTED IMMEDIATELY:  *FEVER GREATER THAN 100.5 F  *CHILLS WITH OR WITHOUT FEVER  NAUSEA AND VOMITING THAT IS NOT CONTROLLED WITH YOUR NAUSEA MEDICATION  *UNUSUAL SHORTNESS OF BREATH  *UNUSUAL BRUISING OR BLEEDING  TENDERNESS IN MOUTH AND THROAT WITH OR WITHOUT PRESENCE OF ULCERS  *URINARY PROBLEMS  *BOWEL PROBLEMS  UNUSUAL RASH Items with * indicate a potential emergency and should be followed up as soon as possible.  Feel free to call the clinic you have any questions or concerns. The clinic phone number is [phone].

## 2013-07-26 NOTE — Telephone Encounter (Signed)
Pt called to remind nurse that she has not had a menstrual period in over 2 yrs now and we may be able to update her status w/ [employer].  Therefore pt will not be required to have pregnancy test prior to each refill.   Will update status on next refill.

## 2013-07-30 ENCOUNTER — Other Ambulatory Visit: Payer: Self-pay | Admitting: *Deleted

## 2013-07-30 DIAGNOSIS — D4989 Neoplasm of unspecified behavior of other specified sites: Secondary | ICD-10-CM

## 2013-07-30 DIAGNOSIS — C9000 Multiple myeloma not having achieved remission: Secondary | ICD-10-CM

## 2013-07-30 DIAGNOSIS — B20 Human immunodeficiency virus [HIV] disease: Secondary | ICD-10-CM

## 2013-07-30 MED ORDER — DOLUTEGRAVIR SODIUM 50 MG PO TABS: 50.0000 mg | ORAL_TABLET | Freq: Every day | ORAL | Status: DC

## 2013-07-30 MED ORDER — ABACAVIR SULFATE-LAMIVUDINE 600-300 MG PO TABS: 1.0000 | ORAL_TABLET | Freq: Every day | ORAL | Status: DC

## 2013-07-31 ENCOUNTER — Other Ambulatory Visit: Payer: Self-pay | Admitting: *Deleted

## 2013-07-31 DIAGNOSIS — C9000 Multiple myeloma not having achieved remission: Secondary | ICD-10-CM

## 2013-08-01 ENCOUNTER — Encounter: Payer: Self-pay | Admitting: Hematology and Oncology

## 2013-08-02 ENCOUNTER — Other Ambulatory Visit: Payer: Self-pay | Admitting: Lab

## 2013-08-02 ENCOUNTER — Telehealth: Payer: Self-pay | Admitting: *Deleted

## 2013-08-02 ENCOUNTER — Ambulatory Visit: Payer: Self-pay

## 2013-08-02 NOTE — Telephone Encounter (Signed)
Informed pt that I forwarded her message to director of Lab, [employer].  If she cannot answer the question, then I will check w/ the coumadin clinic.  She verbalized understanding.

## 2013-08-02 NOTE — Telephone Encounter (Signed)
Attempted to change pt's status with [employer] from Female Child Bearing Potential to Female Non Child Bearing Potential due to fact pt has not had a menstrual period in over two years now.  Spoke w/ [employer] Rep who said that parameter only applies for females who have not had a period in two year Prior to starting chemotherapy.  Since pt has been on chemo, they cannot change her status until pt turns 47 yrs old.   Their rationale is that pt may be having a chemically induced menopause as opposed to natural menopause.  Therefore pt will need to continue to have monthly Pregnancy tests for her Revlimid refills.    Informed pt of above and need to continue w/ monthly pregnancy tests.  Her next cycle of Revlimid due to start on November 2.  We will get pregnacny test on next visit 10/30 and send refill when we have that result.   Pt verbalized understanding.

## 2013-08-02 NOTE — Telephone Encounter (Signed)
THIS REFILL REQUEST FOR REVLIMID WAS GIVEN TO DR.GORSUCH'S NURSE, CAMEO PORTER,RN.

## 2013-08-07 ENCOUNTER — Telehealth: Payer: Self-pay | Admitting: *Deleted

## 2013-08-07 NOTE — Telephone Encounter (Signed)
Pt reports not feeling well with upper respiratory congestion and sore throat started yesterday and she feels worse today.  Denies any fevers.  States using salt water gargles and ginger for sore throat.  She asks if her appts tomorrow on 10/30 can be r/s to Friday 10/31 since she is not feeling well?  She hopes to be feeling better by Friday.

## 2013-08-07 NOTE — Telephone Encounter (Signed)
Informed pt Dr. Bertis Ruddy thinks it makes more sense for her to be seen tomorrow especially since she is not feeling well,  Dr. Bertis Ruddy can assess her.  Pt verbalized understanding and will try to keep her appts as scheduled.  She will call if she doesn't feel she can make it tomorrow.

## 2013-08-07 NOTE — Telephone Encounter (Signed)
Does not make sense for her not to come. If she insists, you can reschedule her to see me at 1045 as add on Friday

## 2013-08-08 ENCOUNTER — Ambulatory Visit: Payer: Self-pay

## 2013-08-08 ENCOUNTER — Other Ambulatory Visit: Payer: Self-pay | Admitting: Lab

## 2013-08-08 ENCOUNTER — Telehealth: Payer: Self-pay | Admitting: *Deleted

## 2013-08-08 ENCOUNTER — Ambulatory Visit: Payer: Self-pay | Admitting: Hematology and Oncology

## 2013-08-08 NOTE — Telephone Encounter (Signed)
Lm gv appts for 08/09/13 with labs @ 9am, flush@ 9:15am, ov@ 10:15am, and tx to follow...td

## 2013-08-08 NOTE — Telephone Encounter (Signed)
Pt states her cold is better but she is feeling dizzy today.  She does not have transportation and is too dizzy to drive herself today.  She asks to be r/s all her appts today to tomorrow.  POF sent to scheduling. Coumadin clinic notified.

## 2013-08-09 ENCOUNTER — Ambulatory Visit (HOSPITAL_BASED_OUTPATIENT_CLINIC_OR_DEPARTMENT_OTHER): Payer: No Typology Code available for payment source

## 2013-08-09 ENCOUNTER — Other Ambulatory Visit (HOSPITAL_BASED_OUTPATIENT_CLINIC_OR_DEPARTMENT_OTHER): Payer: No Typology Code available for payment source | Admitting: Lab

## 2013-08-09 ENCOUNTER — Ambulatory Visit (HOSPITAL_BASED_OUTPATIENT_CLINIC_OR_DEPARTMENT_OTHER): Payer: No Typology Code available for payment source | Admitting: Hematology and Oncology

## 2013-08-09 ENCOUNTER — Other Ambulatory Visit: Payer: Self-pay | Admitting: Lab

## 2013-08-09 ENCOUNTER — Other Ambulatory Visit: Payer: Self-pay | Admitting: *Deleted

## 2013-08-09 ENCOUNTER — Telehealth: Payer: Self-pay | Admitting: *Deleted

## 2013-08-09 ENCOUNTER — Encounter: Payer: Self-pay | Admitting: *Deleted

## 2013-08-09 ENCOUNTER — Telehealth: Payer: Self-pay | Admitting: Hematology and Oncology

## 2013-08-09 ENCOUNTER — Ambulatory Visit (HOSPITAL_BASED_OUTPATIENT_CLINIC_OR_DEPARTMENT_OTHER): Payer: Self-pay | Admitting: Pharmacist

## 2013-08-09 VITALS — BP 129/76 | HR 69 | Temp 96.9°F | Resp 18 | Ht 64.0 in | Wt 165.6 lb

## 2013-08-09 DIAGNOSIS — B20 Human immunodeficiency virus [HIV] disease: Secondary | ICD-10-CM

## 2013-08-09 DIAGNOSIS — Z7901 Long term (current) use of anticoagulants: Secondary | ICD-10-CM

## 2013-08-09 DIAGNOSIS — D4989 Neoplasm of unspecified behavior of other specified sites: Secondary | ICD-10-CM

## 2013-08-09 DIAGNOSIS — Z452 Encounter for adjustment and management of vascular access device: Secondary | ICD-10-CM

## 2013-08-09 DIAGNOSIS — C9000 Multiple myeloma not having achieved remission: Secondary | ICD-10-CM

## 2013-08-09 DIAGNOSIS — Z95828 Presence of other vascular implants and grafts: Secondary | ICD-10-CM

## 2013-08-09 DIAGNOSIS — Z5112 Encounter for antineoplastic immunotherapy: Secondary | ICD-10-CM

## 2013-08-09 DIAGNOSIS — D649 Anemia, unspecified: Secondary | ICD-10-CM

## 2013-08-09 DIAGNOSIS — G609 Hereditary and idiopathic neuropathy, unspecified: Secondary | ICD-10-CM

## 2013-08-09 LAB — COMPREHENSIVE METABOLIC PANEL (CC13)
ALT: 21 U/L (ref 0–55)
AST: 19 U/L (ref 5–34)
Anion Gap: 11 mEq/L (ref 3–11)
BUN: 21.1 mg/dL (ref 7.0–26.0)
CO2: 21 mEq/L — ABNORMAL LOW (ref 22–29)
Calcium: 8.8 mg/dL (ref 8.4–10.4)
Chloride: 113 mEq/L — ABNORMAL HIGH (ref 98–109)
Creatinine: 1 mg/dL (ref 0.6–1.1)
Sodium: 144 mEq/L (ref 136–145)
Total Bilirubin: 0.24 mg/dL (ref 0.20–1.20)
Total Protein: 6.3 g/dL — ABNORMAL LOW (ref 6.4–8.3)

## 2013-08-09 LAB — CBC WITH DIFFERENTIAL/PLATELET
BASO%: 1.1 % (ref 0.0–2.0)
EOS%: 2.3 % (ref 0.0–7.0)
MCH: 33.5 pg (ref 25.1–34.0)
MCHC: 33.2 g/dL (ref 31.5–36.0)
MONO#: 0.5 10*3/uL (ref 0.1–0.9)
NEUT%: 44.6 % (ref 38.4–76.8)
RBC: 3.38 10*6/uL — ABNORMAL LOW (ref 3.70–5.45)
WBC: 5.1 10*3/uL (ref 3.9–10.3)
lymph#: 2.1 10*3/uL (ref 0.9–3.3)

## 2013-08-09 LAB — PROTHROMBIN TIME: INR: 1.91 — ABNORMAL HIGH (ref <1.50)

## 2013-08-09 MED ORDER — ONDANSETRON HCL 8 MG PO TABS
ORAL_TABLET | ORAL | Status: AC
  Filled 2013-08-09: qty 1

## 2013-08-09 MED ORDER — BORTEZOMIB CHEMO SQ INJECTION 3.5 MG (2.5MG/ML)
1.0000 mg/m2 | Freq: Once | INTRAMUSCULAR | Status: AC
  Administered 2013-08-09: 1.75 mg via SUBCUTANEOUS
  Filled 2013-08-09: qty 1.75

## 2013-08-09 MED ORDER — SODIUM CHLORIDE 0.9 % IJ SOLN
10.0000 mL | INTRAMUSCULAR | Status: DC | PRN
  Administered 2013-08-09: 10 mL via INTRAVENOUS
  Filled 2013-08-09: qty 10

## 2013-08-09 MED ORDER — ONDANSETRON HCL 8 MG PO TABS
8.0000 mg | ORAL_TABLET | Freq: Once | ORAL | Status: AC
  Administered 2013-08-09: 8 mg via ORAL

## 2013-08-09 MED ORDER — HEPARIN SOD (PORK) LOCK FLUSH 100 UNIT/ML IV SOLN
500.0000 [IU] | Freq: Once | INTRAVENOUS | Status: AC
  Administered 2013-08-09: 500 [IU] via INTRAVENOUS
  Filled 2013-08-09: qty 5

## 2013-08-09 NOTE — Telephone Encounter (Signed)
gv and printed appt sched and avs for pt for NOV...sed added tx. °

## 2013-08-09 NOTE — Progress Notes (Signed)
INR = 1.91 Pt on Coumadin 7.5 mg/day; 10 mg Mon/Fri MD would like INR to be within 2-3 for DVT prophylaxis. Pt has no complications re: anticoag. INR not quite at goal so I'll increase her dose to 7.5 mg/day; 10 mg MWF. Repeat protime next week & we'll see her in the infusion room. Ebony Hail, Pharm.D., CPP 08/09/2013@11 :59 AM

## 2013-08-09 NOTE — Progress Notes (Signed)
Revlimid Rx sent to [facility], pregnancy test negative.

## 2013-08-09 NOTE — Progress Notes (Signed)
RECEIVED A FAX FROM [facility] CONCERNING A CONFIRMATION OF FACSIM[facility] RECEIPT FOR PT. REFERRAL. 

## 2013-08-09 NOTE — Progress Notes (Signed)
[facility] OFFICE PROGRESS NOTE  Patient Care Team: Randall Hiss, MD as PCP - General (Infectious Diseases) Cliffton Asters, MD as PCP - Infectious Diseases (Infectious Diseases) Exie Parody, MD (Hematology and Oncology) Hartley Barefoot. Allena Katz, MD (Nephrology)  DIAGNOSIS: Multiple myeloma, IgG kappa ongoing treatment  SUMMARY OF ONCOLOGIC HISTORY: This is a very pleasant 47 year old lady who presented with anemia, acute renal failure, and bony abnormalities. Bone marrow aspirate and biopsy in April 2014 show 40% plasma cell involvement and an M spike of over 5 g. She has complex cytogenetics on the bone marrow. The patient was initially treated with Velcade, dexamethasone in April 2014. In May of 2014 Zometa was started after she obtained dental clearance. Revlimid was subsequently added when her kidney function so to normalize. I October 2014, the patient has achieved very good partial response  INTERVAL HISTORY: Jennifer Boyle 47 y.o. female returns for further followup. Recently, she has some sinus congestion but is improving. Denies any fevers or chills. The patient denies any recent signs or symptoms of bleeding such as spontaneous epistaxis, hematuria or hematochezia. She denies any new areas of bone pain. 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 He has some constipation resolved with laxatives.  I have reviewed the past medical history, past surgical history, social history and family history with the patient and they are unchanged from previous note.  ALLERGIES:  is allergic to aspirin and efavirenz.  MEDICATIONS:  Current Outpatient Prescriptions  Medication Sig Dispense Refill  . abacavir-lamiVUDine (EPZICOM) 600-300 MG per tablet Take 1 tablet by mouth daily.  30 tablet  6  . acyclovir (ZOVIRAX) 400 MG tablet Take 0.5 tablets (200 mg total) by mouth daily.  60 tablet  3  . cholecalciferol (VITAMIN D) 1000 UNITS tablet Take 1 tablet (1,000 Units  total) by mouth daily.  30 tablet  3  . dexamethasone (DECADRON) 2 MG tablet Take 4 mg by mouth. Take 5 tablets once weekly (total of 20mg ).      . dolutegravir (TIVICAY) 50 MG tablet Take 1 tablet (50 mg total) by mouth daily.  30 tablet  6  . lenalidomide (REVLIMID) 15 MG capsule Take 1 capsule (15 mg total) by mouth daily. Rest 7 days and repeat  21 capsule  0  . lidocaine-prilocaine (EMLA) cream Apply topically as needed. Apply to portacath one hour before chemo access or blood draw.  30 g  3  . prochlorperazine (COMPAZINE) 10 MG tablet Take 1 tablet (10 mg total) by mouth every 6 (six) hours as needed (Nausea or vomiting).  30 tablet  1  . promethazine (PHENERGAN) 25 MG tablet Take 1 tablet (25 mg total) by mouth every 6 (six) hours as needed for nausea.  30 tablet  0  . sodium bicarbonate 650 MG tablet Take 1 tablet (650 mg total) by mouth 3 (three) times daily.  90 tablet  3  . warfarin (COUMADIN) 5 MG tablet Take 1.5 tablets (7.5 mg total) by mouth daily. 7.5 mg on Mon, Wed, Fri & Sun. 10 mg on Tues, Thurs & Sat.  Or as Directed by MD.  48 tablet  3   No current facility-administered medications for this visit.   Facility-Administered Medications Ordered in Other Visits  Medication Dose Route Frequency Provider Last Rate Last Dose  . heparin lock flush 100 unit/mL  500 Units Intravenous Once Artis Delay, MD      . sodium chloride 0.9 % injection 10 mL  10 mL Intravenous PRN Artis Delay, MD        REVIEW OF SYSTEMS:   Constitutional: Denies fevers, chills or abnormal weight loss Eyes: Denies blurriness of vision Ears, nose, mouth, throat, and face: Denies mucositis or sore throat Respiratory: Denies cough, dyspnea or wheezes Cardiovascular: Denies palpitation, chest discomfort or lower extremity swelling Gastrointestinal:  Denies nausea, heartburn or change in bowel habits Skin: Denies abnormal skin rashes Lymphatics: Denies new lymphadenopathy or easy bruising Neurological:Denies  numbness, tingling or new weaknesses Behavioral/Psych: Mood is stable, no new changes  All other systems were reviewed with the patient and are negative.  PHYSICAL EXAMINATION: ECOG PERFORMANCE STATUS: 0 - Asymptomatic  Filed Vitals:   08/09/13 1006  BP: 129/76  Pulse: 69  Temp: 96.9 F (36.1 C)  Resp: 18   Filed Weights   08/09/13 1006  Weight: 165 lb 9.6 oz (75.116 kg)    GENERAL:alert, no distress and comfortable SKIN: skin color, texture, turgor are normal, no rashes or significant lesions EYES: normal, Conjunctiva are pink and non-injected, sclera clear OROPHARYNX:no exudate, no erythema and lips, buccal mucosa, and tongue normal  NECK: supple, thyroid normal size, non-tender, without nodularity LYMPH:  no palpable lymphadenopathy in the cervical, axillary or inguinal LUNGS: clear to auscultation and percussion with normal breathing effort HEART: regular rate & rhythm and no murmurs and no lower extremity edema ABDOMEN:abdomen soft, non-tender and normal bowel sounds Musculoskeletal:no cyanosis of digits and no clubbing  NEURO: alert & oriented x 3 with fluent speech, no focal motor/sensory deficits  LABORATORY DATA:  I have reviewed the data as listed    Component Value Date/Time   NA 144 08/09/2013 0902   NA 139 05/02/2013 1412   K 4.0 08/09/2013 0902   K 4.1 05/02/2013 1412   CL 109 05/02/2013 1412   CL 111* 03/29/2013 1351   CO2 21* 08/09/2013 0902   CO2 22 05/02/2013 1412   GLUCOSE 107 08/09/2013 0902   GLUCOSE 73 05/02/2013 1412   GLUCOSE 129* 03/29/2013 1351   BUN 21.1 08/09/2013 0902   BUN 14 05/02/2013 1412   CREATININE 1.0 08/09/2013 0902   CREATININE 1.00 05/02/2013 1412   CREATININE 4.01* 02/04/2013 0850   CALCIUM 8.8 08/09/2013 0902   CALCIUM 8.4 05/02/2013 1412   PROT 6.3* 08/09/2013 0902   PROT 6.1 05/02/2013 1412   ALBUMIN 3.2* 08/09/2013 0902   ALBUMIN 3.5 05/02/2013 1412   AST 19 08/09/2013 0902   AST 23 05/02/2013 1412   ALT 21 08/09/2013 0902    ALT 28 05/02/2013 1412   ALKPHOS 101 08/09/2013 0902   ALKPHOS 154* 05/02/2013 1412   BILITOT 0.24 08/09/2013 0902   BILITOT 0.4 05/02/2013 1412   GFRNONAA 12* 02/04/2013 0850   GFRAA 14* 02/04/2013 0850    No results found for this basename: SPEP,  UPEP,   kappa and lambda light chains    Lab Results  Component Value Date   WBC 5.1 08/09/2013   NEUTROABS 2.3 08/09/2013   HGB 11.3* 08/09/2013   HCT 34.0* 08/09/2013   MCV 100.7 08/09/2013   PLT 204 08/09/2013      Chemistry      Component Value Date/Time   NA 144 08/09/2013 0902   NA 139 05/02/2013 1412   K 4.0 08/09/2013 0902   K 4.1 05/02/2013 1412   CL 109 05/02/2013 1412   CL 111* 03/29/2013 1351   CO2 21* 08/09/2013 0902   CO2 22 05/02/2013 1412   BUN 21.1 08/09/2013 0902  BUN 14 05/02/2013 1412   CREATININE 1.0 08/09/2013 0902   CREATININE 1.00 05/02/2013 1412   CREATININE 4.01* 02/04/2013 0850      Component Value Date/Time   CALCIUM 8.8 08/09/2013 0902   CALCIUM 8.4 05/02/2013 1412   ALKPHOS 101 08/09/2013 0902   ALKPHOS 154* 05/02/2013 1412   AST 19 08/09/2013 0902   AST 23 05/02/2013 1412   ALT 21 08/09/2013 0902   ALT 28 05/02/2013 1412   BILITOT 0.24 08/09/2013 0902   BILITOT 0.4 05/02/2013 1412    ASSESSMENT:  #1 multiple myeloma, with very good partial response #2 HIV infection, stable #3 chronic anemia, anemia chronic disease  PLAN:  #1 multiple myeloma We will continue Revlimid, dexamethasone, and Velcade as well as monthly Zometa. I am going to refer her to the bone marrow transplant in [facility] for further evaluation. #2 HIV infection Is no evidence of active HIV infection. Her white count is normal. She will continue all her retroviral medications. #3 chronic anemia This is likely anemia of chronic disease. The patient denies recent history of bleeding such as epistaxis, hematuria or hematochezia. She is asymptomatic from the anemia. We will observe for now.  She does not require  transfusion now.  #4 history of bone pain She'll continue calcium and vitamin D supplements. #5 DVT prophylaxis The patient is currently on Coumadin. 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 It is stable.  Orders Placed This Encounter  Procedures  . SPEP & IFE with QIG    Standing Status: Future     Number of Occurrences:      Standing Expiration Date: 08/09/2014  . Kappa/lambda light chains    Standing Status: Future     Number of Occurrences:      Standing Expiration Date: 08/09/2014  . Beta 2 microglobuline, serum    Standing Status: Future     Number of Occurrences:      Standing Expiration Date: 08/09/2014  . CBC with Differential    Standing Status: Future     Number of Occurrences:      Standing Expiration Date: 05/01/2014  . Comprehensive metabolic panel    Standing Status: Future     Number of Occurrences:      Standing Expiration Date: 08/09/2014   All questions were answered. The patient knows to call the clinic with any problems, questions or concerns. No barriers to learning was detected. I spent 40 minutes counseling the patient face to face. The total time spent in the appointment was 60 minutes and more than 50% was on counseling and review of test results     [facility], Connery Shiffler, MD 08/09/2013 11:05 AM

## 2013-08-09 NOTE — Telephone Encounter (Signed)
Per staff message and POF I have scheduled appts.  JMW  

## 2013-08-12 ENCOUNTER — Encounter: Payer: Self-pay | Admitting: *Deleted

## 2013-08-12 NOTE — Progress Notes (Signed)
RECEIVED A FAX FROM [employer] CONCERNING A CONFIRMATION OF PRESCRIPTION SHIPMENT FOR REVLIMID ON 08/09/13.

## 2013-08-16 ENCOUNTER — Ambulatory Visit (HOSPITAL_BASED_OUTPATIENT_CLINIC_OR_DEPARTMENT_OTHER): Payer: Self-pay | Admitting: Pharmacist

## 2013-08-16 ENCOUNTER — Other Ambulatory Visit (HOSPITAL_BASED_OUTPATIENT_CLINIC_OR_DEPARTMENT_OTHER): Payer: No Typology Code available for payment source | Admitting: Lab

## 2013-08-16 ENCOUNTER — Other Ambulatory Visit: Payer: Self-pay | Admitting: Lab

## 2013-08-16 ENCOUNTER — Ambulatory Visit (HOSPITAL_BASED_OUTPATIENT_CLINIC_OR_DEPARTMENT_OTHER): Payer: No Typology Code available for payment source

## 2013-08-16 VITALS — BP 137/91 | HR 82 | Temp 98.1°F

## 2013-08-16 DIAGNOSIS — Z7901 Long term (current) use of anticoagulants: Secondary | ICD-10-CM

## 2013-08-16 DIAGNOSIS — D4989 Neoplasm of unspecified behavior of other specified sites: Secondary | ICD-10-CM

## 2013-08-16 DIAGNOSIS — C9000 Multiple myeloma not having achieved remission: Secondary | ICD-10-CM

## 2013-08-16 DIAGNOSIS — Z95828 Presence of other vascular implants and grafts: Secondary | ICD-10-CM

## 2013-08-16 DIAGNOSIS — Z5112 Encounter for antineoplastic immunotherapy: Secondary | ICD-10-CM

## 2013-08-16 DIAGNOSIS — Z452 Encounter for adjustment and management of vascular access device: Secondary | ICD-10-CM

## 2013-08-16 LAB — CBC WITH DIFFERENTIAL/PLATELET
BASO%: 0.8 % (ref 0.0–2.0)
Basophils Absolute: 0 10*3/uL (ref 0.0–0.1)
Eosinophils Absolute: 0.2 10*3/uL (ref 0.0–0.5)
HCT: 36.4 % (ref 34.8–46.6)
HGB: 12 g/dL (ref 11.6–15.9)
LYMPH%: 38.2 % (ref 14.0–49.7)
MCHC: 32.9 g/dL (ref 31.5–36.0)
MONO#: 0.7 10*3/uL (ref 0.1–0.9)
NEUT#: 2 10*3/uL (ref 1.5–6.5)
NEUT%: 42.2 % (ref 38.4–76.8)
Platelets: 219 10*3/uL (ref 145–400)
RBC: 3.57 10*6/uL — ABNORMAL LOW (ref 3.70–5.45)
WBC: 4.7 10*3/uL (ref 3.9–10.3)

## 2013-08-16 LAB — COMPREHENSIVE METABOLIC PANEL
ALT: 20 U/L (ref 0–35)
AST: 21 U/L (ref 0–37)
Alkaline Phosphatase: 99 U/L (ref 39–117)
CO2: 25 mEq/L (ref 19–32)
Chloride: 108 mEq/L (ref 96–112)
Creatinine, Ser: 1.03 mg/dL (ref 0.50–1.10)
Glucose, Bld: 113 mg/dL — ABNORMAL HIGH (ref 70–99)
Sodium: 141 mEq/L (ref 135–145)
Total Bilirubin: 0.2 mg/dL — ABNORMAL LOW (ref 0.3–1.2)

## 2013-08-16 LAB — PROTHROMBIN TIME: INR: 1.69 — ABNORMAL HIGH (ref <1.50)

## 2013-08-16 MED ORDER — ONDANSETRON HCL 8 MG PO TABS
8.0000 mg | ORAL_TABLET | Freq: Once | ORAL | Status: AC
  Administered 2013-08-16: 8 mg via ORAL

## 2013-08-16 MED ORDER — ZOLEDRONIC ACID 4 MG/100ML IV SOLN
4.0000 mg | Freq: Once | INTRAVENOUS | Status: AC
  Administered 2013-08-16: 4 mg via INTRAVENOUS
  Filled 2013-08-16: qty 100

## 2013-08-16 MED ORDER — SODIUM CHLORIDE 0.9 % IJ SOLN
10.0000 mL | INTRAMUSCULAR | Status: DC | PRN
  Administered 2013-08-16: 10 mL via INTRAVENOUS
  Filled 2013-08-16: qty 10

## 2013-08-16 MED ORDER — ONDANSETRON HCL 8 MG PO TABS
ORAL_TABLET | ORAL | Status: AC
  Filled 2013-08-16: qty 1

## 2013-08-16 MED ORDER — SODIUM CHLORIDE 0.9 % IV SOLN
Freq: Once | INTRAVENOUS | Status: AC
  Administered 2013-08-16: 16:00:00 via INTRAVENOUS

## 2013-08-16 MED ORDER — HEPARIN SOD (PORK) LOCK FLUSH 100 UNIT/ML IV SOLN
500.0000 [IU] | Freq: Once | INTRAVENOUS | Status: AC
  Administered 2013-08-16: 500 [IU] via INTRAVENOUS
  Filled 2013-08-16: qty 5

## 2013-08-16 MED ORDER — BORTEZOMIB CHEMO SQ INJECTION 3.5 MG (2.5MG/ML)
1.0000 mg/m2 | Freq: Once | INTRAMUSCULAR | Status: AC
  Administered 2013-08-16: 1.75 mg via SUBCUTANEOUS
  Filled 2013-08-16: qty 1.75

## 2013-08-16 NOTE — Patient Instructions (Signed)
Implanted Port Instructions  An implanted port is a central line that has a round shape and is placed under the skin. It is used for long-term IV (intravenous) access for:  · Medicine.  · Fluids.  · Liquid nutrition, such as TPN (total parenteral nutrition).  · Blood samples.  Ports can be placed:  · In the chest area just below the collarbone (this is the most common place.)  · In the arms.  · In the belly (abdomen) area.  · In the legs.  PARTS OF THE PORT  A port has 2 main parts:  · The reservoir. The reservoir is round, disc-shaped, and will be a small, raised area under your skin.  · The reservoir is the part where a needle is inserted (accessed) to either give medicines or to draw blood.  · The catheter. The catheter is a long, slender tube that extends from the reservoir. The catheter is placed into a large vein.  · Medicine that is inserted into the reservoir goes into the catheter and then into the vein.  INSERTION OF THE PORT  · The port is surgically placed in either an operating room or in a procedural area (interventional radiology).  · Medicine may be given to help you relax during the procedure.  · The skin where the port will be inserted is numbed (local anesthetic).  · 1 or 2 small cuts (incisions) will be made in the skin to insert the port.  · The port can be used after it has been inserted.  INCISION SITE CARE  · The incision site may have small adhesive strips on it. This helps keep the incision site closed. Sometimes, no adhesive strips are placed. Instead of adhesive strips, a special kind of surgical glue is used to keep the incision closed.  · If adhesive strips were placed on the incision sites, do not take them off. They will fall off on their own.  · The incision site may be sore for 1 to 2 days. Pain medicine can help.  · Do not get the incision site wet. Bathe or shower as directed by your caregiver.  · The incision site should heal in 5 to 7 days. A small scar may form after the  incision has healed.  ACCESSING THE PORT  Special steps must be taken to access the port:  · Before the port is accessed, a numbing cream can be placed on the skin. This helps numb the skin over the port site.  · A sterile technique is used to access the port.  · The port is accessed with a needle. Only "non-coring" port needles should be used to access the port. Once the port is accessed, a blood return should be checked. This helps ensure the port is in the vein and is not clogged (clotted).  · If your caregiver believes your port should remain accessed, a clear (transparent) bandage will be placed over the needle site. The bandage and needle will need to be changed every week or as directed by your caregiver.  · Keep the bandage covering the needle clean and dry. Do not get it wet. Follow your caregiver's instructions on how to take a shower or bath when the port is accessed.  · If your port does not need to stay accessed, no bandage is needed over the port.  FLUSHING THE PORT  Flushing the port keeps it from getting clogged. How often the port is flushed depends on:  · If a   constant infusion is running. If a constant infusion is running, the port may not need to be flushed.  · If intermittent medicines are given.  · If the port is not being used.  For intermittent medicines:  · The port will need to be flushed:  · After medicines have been given.  · After blood has been drawn.  · As part of routine maintenance.  · A port is normally flushed with:  · Normal saline.  · Heparin.  · Follow your caregiver's advice on how often, how much, and the type of flush to use on your port.  IMPORTANT PORT INFORMATION  · Tell your caregiver if you are allergic to heparin.  · After your port is placed, you will get a manufacturer's information card. The card has information about your port. Keep this card with you at all times.  · There are many types of ports available. Know what kind of port you have.  · In case of an  emergency, it may be helpful to wear a medical alert bracelet. This can help alert health care workers that you have a port.  · The port can stay in for as long as your caregiver believes it is necessary.  · When it is time for the port to come out, surgery will be done to remove it. The surgery will be similar to how the port was put in.  · If you are in the hospital or clinic:  · Your port will be taken care of and flushed by a nurse.  · If you are at home:  · A home health care nurse may give medicines and take care of the port.  · You or a family member can get special training and directions for giving medicine and taking care of the port at home.  SEEK IMMEDIATE MEDICAL CARE IF:   · Your port does not flush or you are unable to get a blood return.  · New drainage or pus is coming from the incision.  · A bad smell is coming from the incision site.  · You develop swelling or increased redness at the incision site.  · You develop increased swelling or pain at the port site.  · You develop swelling or pain in the surrounding skin near the port.  · You have an oral temperature above 102° F (38.9° C), not controlled by medicine.  MAKE SURE YOU:   · Understand these instructions.  · Will watch your condition.  · Will get help right away if you are not doing well or get worse.  Document Released: 09/26/2005 Document Revised: 12/19/2011 Document Reviewed: 12/18/2008  [employer]® Patient Information ©2014 [employer].

## 2013-08-16 NOTE — Progress Notes (Signed)
INR = 1.69    Goal 2-3 Patient states she has eaten spinach 3 times this week and that is unusual for her.  She has no plans to eat spinach this week. No changes in medications. No bleeding or bruising noted. Patient says that she does not like high doses, she feels they affect her skin. Will increase dose slightly to Coumadin 10 mg M/W/F/Sat, 7.5 mg all other days. Patient will be seen in the infusion area next week on 08/23/13.  Patient has 13:45 lab appt, then 14:30 chemo. Cletis Athens, PharmD

## 2013-08-16 NOTE — Patient Instructions (Signed)
[facility] Discharge Instructions for Patients Receiving Chemotherapy  Today you received the following chemotherapy agents: Zometa, Velcade  To help prevent nausea and vomiting after your treatment, we encourage you to take your nausea medication.  Take it as often as prescribed.     If you develop nausea and vomiting that is not controlled by your nausea medication, call the clinic. If it is after clinic hours your family physician or the after hours number for the clinic or go to the Emergency Department.   BELOW ARE SYMPTOMS THAT SHOULD BE REPORTED IMMEDIATELY:  *FEVER GREATER THAN 100.5 F  *CHILLS WITH OR WITHOUT FEVER  NAUSEA AND VOMITING THAT IS NOT CONTROLLED WITH YOUR NAUSEA MEDICATION  *UNUSUAL SHORTNESS OF BREATH  *UNUSUAL BRUISING OR BLEEDING  TENDERNESS IN MOUTH AND THROAT WITH OR WITHOUT PRESENCE OF ULCERS  *URINARY PROBLEMS  *BOWEL PROBLEMS  UNUSUAL RASH Items with * indicate a potential emergency and should be followed up as soon as possible.  Feel free to call the clinic you have any questions or concerns. The clinic phone number is [phone].   I have been informed and understand all the instructions given to me. I know to contact the clinic, my physician, or go to the Emergency Department if any problems should occur. I do not have any questions at this time, but understand that I may call the clinic during office hours   should I have any questions or need assistance in obtaining follow up care.    __________________________________________  _____________  __________ Signature of Patient or Authorized Representative            Date                   Time    __________________________________________ Nurse's Signature

## 2013-08-20 LAB — SPEP & IFE WITH QIG
Albumin ELP: 58.4 % (ref 55.8–66.1)
Alpha-1-Globulin: 8 % — ABNORMAL HIGH (ref 2.9–4.9)
Alpha-2-Globulin: 12.7 % — ABNORMAL HIGH (ref 7.1–11.8)
Beta 2: 3.1 % — ABNORMAL LOW (ref 3.2–6.5)
Beta Globulin: 6.4 % (ref 4.7–7.2)
IgA: 74 mg/dL (ref 69–380)
M-Spike, %: 0.33 g/dL
Total Protein, Serum Electrophoresis: 6.3 g/dL (ref 6.0–8.3)

## 2013-08-20 LAB — KAPPA/LAMBDA LIGHT CHAINS
Kappa:Lambda Ratio: 8.61 — ABNORMAL HIGH (ref 0.26–1.65)
Lambda Free Lght Chn: 1.8 mg/dL (ref 0.57–2.63)

## 2013-08-20 LAB — BETA 2 MICROGLOBULIN, SERUM: Beta-2 Microglobulin: 1.76 mg/L — ABNORMAL HIGH (ref 1.01–1.73)

## 2013-08-22 ENCOUNTER — Encounter: Payer: Self-pay | Admitting: *Deleted

## 2013-08-22 NOTE — Progress Notes (Signed)
Dr. Bertis Ruddy completed Medical statement regarding cancer for Social Security disability claim form.  Faxed completed and signed form back to [employer] at fax #[phone]

## 2013-08-23 ENCOUNTER — Ambulatory Visit (HOSPITAL_BASED_OUTPATIENT_CLINIC_OR_DEPARTMENT_OTHER): Payer: Self-pay | Admitting: Pharmacist

## 2013-08-23 ENCOUNTER — Ambulatory Visit (HOSPITAL_BASED_OUTPATIENT_CLINIC_OR_DEPARTMENT_OTHER): Payer: No Typology Code available for payment source

## 2013-08-23 ENCOUNTER — Other Ambulatory Visit (HOSPITAL_BASED_OUTPATIENT_CLINIC_OR_DEPARTMENT_OTHER): Payer: No Typology Code available for payment source

## 2013-08-23 ENCOUNTER — Ambulatory Visit: Payer: Self-pay

## 2013-08-23 VITALS — BP 136/81 | HR 82 | Temp 97.8°F | Resp 18

## 2013-08-23 DIAGNOSIS — C9000 Multiple myeloma not having achieved remission: Secondary | ICD-10-CM

## 2013-08-23 DIAGNOSIS — Z7901 Long term (current) use of anticoagulants: Secondary | ICD-10-CM

## 2013-08-23 DIAGNOSIS — Z452 Encounter for adjustment and management of vascular access device: Secondary | ICD-10-CM

## 2013-08-23 DIAGNOSIS — D4989 Neoplasm of unspecified behavior of other specified sites: Secondary | ICD-10-CM

## 2013-08-23 DIAGNOSIS — C9001 Multiple myeloma in remission: Secondary | ICD-10-CM

## 2013-08-23 DIAGNOSIS — Z5112 Encounter for antineoplastic immunotherapy: Secondary | ICD-10-CM

## 2013-08-23 LAB — CBC WITH DIFFERENTIAL/PLATELET
Basophils Absolute: 0 10*3/uL (ref 0.0–0.1)
EOS%: 0.6 % (ref 0.0–7.0)
Eosinophils Absolute: 0 10*3/uL (ref 0.0–0.5)
HCT: 34.2 % — ABNORMAL LOW (ref 34.8–46.6)
LYMPH%: 15.8 % (ref 14.0–49.7)
MCH: 33 pg (ref 25.1–34.0)
MCV: 101.5 fL — ABNORMAL HIGH (ref 79.5–101.0)
MONO%: 2.5 % (ref 0.0–14.0)
NEUT%: 80.7 % — ABNORMAL HIGH (ref 38.4–76.8)
RBC: 3.38 10*6/uL — ABNORMAL LOW (ref 3.70–5.45)
RDW: 15.6 % — ABNORMAL HIGH (ref 11.2–14.5)

## 2013-08-23 LAB — COMPREHENSIVE METABOLIC PANEL (CC13)
AST: 19 U/L (ref 5–34)
Albumin: 3.3 g/dL — ABNORMAL LOW (ref 3.5–5.0)
Alkaline Phosphatase: 103 U/L (ref 40–150)
BUN: 20.6 mg/dL (ref 7.0–26.0)
Potassium: 3.7 mEq/L (ref 3.5–5.1)
Sodium: 142 mEq/L (ref 136–145)
Total Bilirubin: 0.3 mg/dL (ref 0.20–1.20)

## 2013-08-23 LAB — POCT INR: INR: 2.52

## 2013-08-23 LAB — PROTHROMBIN TIME: INR: 2.52 — ABNORMAL HIGH (ref <1.50)

## 2013-08-23 MED ORDER — ONDANSETRON HCL 8 MG PO TABS
8.0000 mg | ORAL_TABLET | Freq: Once | ORAL | Status: AC
  Administered 2013-08-23: 8 mg via ORAL

## 2013-08-23 MED ORDER — BORTEZOMIB CHEMO SQ INJECTION 3.5 MG (2.5MG/ML)
1.0000 mg/m2 | Freq: Once | INTRAMUSCULAR | Status: AC
  Administered 2013-08-23: 1.75 mg via SUBCUTANEOUS
  Filled 2013-08-23: qty 1.75

## 2013-08-23 MED ORDER — HEPARIN SOD (PORK) LOCK FLUSH 100 UNIT/ML IV SOLN
500.0000 [IU] | Freq: Once | INTRAVENOUS | Status: AC
  Administered 2013-08-23: 500 [IU] via INTRAVENOUS
  Filled 2013-08-23: qty 5

## 2013-08-23 MED ORDER — SODIUM CHLORIDE 0.9 % IJ SOLN
10.0000 mL | INTRAMUSCULAR | Status: DC | PRN
  Administered 2013-08-23: 10 mL via INTRAVENOUS
  Filled 2013-08-23: qty 10

## 2013-08-23 MED ORDER — ONDANSETRON HCL 8 MG PO TABS
ORAL_TABLET | ORAL | Status: AC
  Filled 2013-08-23: qty 1

## 2013-08-23 NOTE — Progress Notes (Signed)
INR at goal on Coumadin 10mg  M/W/F/Sa and 7.5mg  other days.  No bleeding or bruising.  Jennifer Boyle will be coming back next week for appt with Dr. Bertis Ruddy and chemo.  Will continue same coumadin dose and check PT/INR next week.

## 2013-08-23 NOTE — Patient Instructions (Signed)
[facility] Discharge Instructions for Patients Receiving Chemotherapy  Today you received the following chemotherapy agents: Velcade.   To help prevent nausea and vomiting after your treatment, we encourage you to take your nausea medication as needed.   If you develop nausea and vomiting that is not controlled by your nausea medication, call the clinic.   BELOW ARE SYMPTOMS THAT SHOULD BE REPORTED IMMEDIATELY:  *FEVER GREATER THAN 100.5 F  *CHILLS WITH OR WITHOUT FEVER  NAUSEA AND VOMITING THAT IS NOT CONTROLLED WITH YOUR NAUSEA MEDICATION  *UNUSUAL SHORTNESS OF BREATH  *UNUSUAL BRUISING OR BLEEDING  TENDERNESS IN MOUTH AND THROAT WITH OR WITHOUT PRESENCE OF ULCERS  *URINARY PROBLEMS  *BOWEL PROBLEMS  UNUSUAL RASH Items with * indicate a potential emergency and should be followed up as soon as possible.  Feel free to call the clinic should you have any questions or concerns. The clinic phone number is [phone].    

## 2013-08-26 ENCOUNTER — Encounter: Payer: Self-pay | Admitting: Hematology and Oncology

## 2013-08-26 ENCOUNTER — Other Ambulatory Visit: Payer: Self-pay | Admitting: *Deleted

## 2013-08-27 ENCOUNTER — Other Ambulatory Visit: Payer: Self-pay | Admitting: *Deleted

## 2013-08-27 NOTE — Telephone Encounter (Signed)
THIS REFILL REQUEST FOR REVLIMID WAS PLACED ON DR.GORSUCH'S NURSE'S DESK. 

## 2013-08-29 ENCOUNTER — Telehealth: Payer: Self-pay | Admitting: Hematology and Oncology

## 2013-08-29 ENCOUNTER — Other Ambulatory Visit: Payer: Self-pay | Admitting: Hematology and Oncology

## 2013-08-29 ENCOUNTER — Encounter: Payer: Self-pay | Admitting: Hematology and Oncology

## 2013-08-29 ENCOUNTER — Other Ambulatory Visit: Payer: Self-pay | Admitting: *Deleted

## 2013-08-29 DIAGNOSIS — C9000 Multiple myeloma not having achieved remission: Secondary | ICD-10-CM

## 2013-08-29 DIAGNOSIS — D4989 Neoplasm of unspecified behavior of other specified sites: Secondary | ICD-10-CM

## 2013-08-29 NOTE — Telephone Encounter (Signed)
I spoke with the patient over the phone. I will change her treatment starting today. I will discontinue Velcade. She has achieved very good partial response. She has peripheral neuropathy and am trying to spare her from toxicity. My plan would be to just treat her with Revlimid alone until her bone marrow transplant evaluation. I would also discontinue Coumadin and switch her to aspirin only.

## 2013-08-29 NOTE — Telephone Encounter (Signed)
Message copied by Phillis Knack on Thu Aug 29, 2013 12:22 PM ------      Message from: [facility], [place]      Created: Thu Aug 29, 2013  9:48 AM      Regarding: Stop Rx       I have placed order to stop all her rx and will switch A/C Rx from warfarin to aspirin only      I will wait for BMT eval before we proceed with further Rx ------

## 2013-08-29 NOTE — Telephone Encounter (Signed)
worked 11/20 POF2 and sw pt to verify knew of 11/21 and 12/4 appts shh

## 2013-08-29 NOTE — Telephone Encounter (Signed)
Refill for Revlimid requested, need negative urine, to be done 08/30/13.

## 2013-08-30 ENCOUNTER — Other Ambulatory Visit: Payer: Self-pay | Admitting: Lab

## 2013-08-30 ENCOUNTER — Ambulatory Visit: Payer: Self-pay

## 2013-08-30 ENCOUNTER — Other Ambulatory Visit: Payer: Self-pay | Admitting: *Deleted

## 2013-08-30 ENCOUNTER — Ambulatory Visit: Payer: Self-pay | Admitting: Hematology and Oncology

## 2013-08-30 ENCOUNTER — Other Ambulatory Visit (HOSPITAL_BASED_OUTPATIENT_CLINIC_OR_DEPARTMENT_OTHER): Payer: No Typology Code available for payment source | Admitting: Lab

## 2013-08-30 ENCOUNTER — Other Ambulatory Visit: Payer: Self-pay

## 2013-08-30 ENCOUNTER — Ambulatory Visit (HOSPITAL_BASED_OUTPATIENT_CLINIC_OR_DEPARTMENT_OTHER): Payer: No Typology Code available for payment source

## 2013-08-30 DIAGNOSIS — C9000 Multiple myeloma not having achieved remission: Secondary | ICD-10-CM

## 2013-08-30 DIAGNOSIS — D4989 Neoplasm of unspecified behavior of other specified sites: Secondary | ICD-10-CM

## 2013-08-30 DIAGNOSIS — Z452 Encounter for adjustment and management of vascular access device: Secondary | ICD-10-CM

## 2013-08-30 LAB — CBC WITH DIFFERENTIAL/PLATELET
BASO%: 0.5 % (ref 0.0–2.0)
Basophils Absolute: 0 10*3/uL (ref 0.0–0.1)
HCT: 35.9 % (ref 34.8–46.6)
HGB: 11.8 g/dL (ref 11.6–15.9)
LYMPH%: 29.6 % (ref 14.0–49.7)
MCHC: 32.8 g/dL (ref 31.5–36.0)
MCV: 102 fL — ABNORMAL HIGH (ref 79.5–101.0)
MONO#: 0.7 10*3/uL (ref 0.1–0.9)
NEUT%: 52.4 % (ref 38.4–76.8)
Platelets: 218 10*3/uL (ref 145–400)
RBC: 3.53 10*6/uL — ABNORMAL LOW (ref 3.70–5.45)
WBC: 5.3 10*3/uL (ref 3.9–10.3)

## 2013-08-30 LAB — COMPREHENSIVE METABOLIC PANEL (CC13)
ALT: 20 U/L (ref 0–55)
AST: 18 U/L (ref 5–34)
Alkaline Phosphatase: 99 U/L (ref 40–150)
Anion Gap: 8 mEq/L (ref 3–11)
Glucose: 90 mg/dl (ref 70–140)
Potassium: 3.7 mEq/L (ref 3.5–5.1)
Sodium: 143 mEq/L (ref 136–145)
Total Bilirubin: 0.38 mg/dL (ref 0.20–1.20)
Total Protein: 6.3 g/dL — ABNORMAL LOW (ref 6.4–8.3)

## 2013-08-30 LAB — HCG, SERUM, QUALITATIVE: Preg, Serum: NEGATIVE

## 2013-08-30 MED ORDER — HEPARIN SOD (PORK) LOCK FLUSH 100 UNIT/ML IV SOLN
500.0000 [IU] | Freq: Once | INTRAVENOUS | Status: AC
  Administered 2013-08-30: 500 [IU] via INTRAVENOUS
  Filled 2013-08-30: qty 5

## 2013-08-30 MED ORDER — SODIUM CHLORIDE 0.9 % IJ SOLN
10.0000 mL | INTRAMUSCULAR | Status: DC | PRN
  Administered 2013-08-30: 10 mL via INTRAVENOUS
  Filled 2013-08-30: qty 10

## 2013-08-30 MED ORDER — LENALIDOMIDE 15 MG PO CAPS: 15.0000 mg | ORAL_CAPSULE | Freq: Every day | ORAL | Status: DC

## 2013-08-30 NOTE — Patient Instructions (Signed)

## 2013-09-03 ENCOUNTER — Encounter: Payer: Self-pay | Admitting: Hematology and Oncology

## 2013-09-03 NOTE — Progress Notes (Signed)
Patient called and said was told she didn't qualify for asst. I will email and see reason why????

## 2013-09-06 NOTE — Telephone Encounter (Signed)
RECEIVED A FAX FROM [employer] CONCERNING A CONFIRMATION OF PRESCRIPTION SHIPMENT FOR REVLIMID ON 09/03/13.

## 2013-09-12 ENCOUNTER — Ambulatory Visit (HOSPITAL_BASED_OUTPATIENT_CLINIC_OR_DEPARTMENT_OTHER): Payer: No Typology Code available for payment source | Admitting: Hematology and Oncology

## 2013-09-12 ENCOUNTER — Ambulatory Visit: Payer: No Typology Code available for payment source | Admitting: Licensed Clinical Social Worker

## 2013-09-12 ENCOUNTER — Telehealth: Payer: Self-pay | Admitting: *Deleted

## 2013-09-12 VITALS — BP 150/86 | HR 86 | Temp 98.1°F | Resp 18 | Ht 64.0 in | Wt 172.7 lb

## 2013-09-12 DIAGNOSIS — I10 Essential (primary) hypertension: Secondary | ICD-10-CM

## 2013-09-12 DIAGNOSIS — B20 Human immunodeficiency virus [HIV] disease: Secondary | ICD-10-CM

## 2013-09-12 DIAGNOSIS — G609 Hereditary and idiopathic neuropathy, unspecified: Secondary | ICD-10-CM

## 2013-09-12 DIAGNOSIS — C9000 Multiple myeloma not having achieved remission: Secondary | ICD-10-CM

## 2013-09-12 NOTE — Telephone Encounter (Signed)
appts made and printed. Pt is aware that tx will be added. i emailed MW to add the tx's...td 

## 2013-09-12 NOTE — Telephone Encounter (Signed)
Per staff message and POF I have scheduled appts.  JMW  

## 2013-09-12 NOTE — Progress Notes (Signed)
[name] OFFICE PROGRESS NOTE  Patient Care Team: Randall Hiss, MD as PCP - General (Infectious Diseases) Cliffton Asters, MD as PCP - Infectious Diseases (Infectious Diseases) Hartley Barefoot. Allena Katz, MD (Nephrology) Artis Delay, MD as Consulting Physician (Hematology and Oncology)  DIAGNOSIS: IgG kappa multiple myeloma, on maintenance Revlimid  SUMMARY OF ONCOLOGIC HISTORY: This is a very pleasant 47 year old lady who presented with anemia, acute renal failure, and bony abnormalities. Bone marrow aspirate and biopsy in April 2014 show 40% plasma cell involvement and an M spike of over 5 g. She has complex cytogenetics on the bone marrow. The patient was initially treated with Velcade, dexamethasone in April 2014.  In May of 2014 Zometa was started after she obtained dental clearance. Revlimid was subsequently added when her kidney function so to normalize.  In October 2014, the patient has achieved very good partial response In November 2014, we discontinue Velcade due to worsening peripheral neuropathy. We also discontinue dexamethasone. Her anticoagulation management was switched from warfarin to aspirin INTERVAL HISTORY: Jennifer Boyle 47 y.o. female returns for further followup. She denies any recent bone pain. She denies any recent fever, chills, night sweats or abnormal weight loss Her peripheral neuropathy is a little was with the cold weather. It is occasionally painful.  I have reviewed the past medical history, past surgical history, social history and family history with the patient and they are unchanged from previous note.  ALLERGIES:  is allergic to aspirin and efavirenz.  MEDICATIONS:  Current Outpatient Prescriptions  Medication Sig Dispense Refill  . abacavir-lamiVUDine (EPZICOM) 600-300 MG per tablet Take 1 tablet by mouth daily.  30 tablet  6  . acyclovir (ZOVIRAX) 400 MG tablet Take 0.5 tablets (200 mg total) by mouth daily.  60 tablet  3  . aspirin 81  MG tablet Take 81 mg by mouth daily.      . cholecalciferol (VITAMIN D) 1000 UNITS tablet Take 1 tablet (1,000 Units total) by mouth daily.  30 tablet  3  . docusate sodium (COLACE) 100 MG capsule Take 100 mg by mouth 2 (two) times daily.      . dolutegravir (TIVICAY) 50 MG tablet Take 1 tablet (50 mg total) by mouth daily.  30 tablet  6  . lenalidomide (REVLIMID) 15 MG capsule Take 1 capsule (15 mg total) by mouth daily. Rest 7 days and repeat  21 capsule  0  . lidocaine-prilocaine (EMLA) cream Apply topically as needed. Apply to portacath one hour before chemo access or blood draw.  30 g  3  . polyethylene glycol (MIRALAX / GLYCOLAX) packet Take 17 g by mouth daily.      . prochlorperazine (COMPAZINE) 10 MG tablet Take 1 tablet (10 mg total) by mouth every 6 (six) hours as needed (Nausea or vomiting).  30 tablet  1  . promethazine (PHENERGAN) 25 MG tablet Take 1 tablet (25 mg total) by mouth every 6 (six) hours as needed for nausea.  30 tablet  0  . sodium bicarbonate 650 MG tablet Take 1 tablet (650 mg total) by mouth 3 (three) times daily.  90 tablet  3   No current facility-administered medications for this visit.    REVIEW OF SYSTEMS:   Constitutional: Denies fevers, chills or abnormal weight loss Eyes: Denies blurriness of vision Ears, nose, mouth, throat, and face: Denies mucositis or sore throat Respiratory: Denies cough, dyspnea or wheezes Cardiovascular: Denies palpitation, chest discomfort or lower extremity swelling Gastrointestinal:  Denies nausea, heartburn or  change in bowel habits Skin: Denies abnormal skin rashes Lymphatics: Denies new lymphadenopathy or easy bruising Behavioral/Psych: Mood is stable, no new changes  All other systems were reviewed with the patient and are negative.  PHYSICAL EXAMINATION: ECOG PERFORMANCE STATUS: 1 - Symptomatic but completely ambulatory  Filed Vitals:   09/12/13 1309  BP: 150/86  Pulse: 86  Temp: 98.1 F (36.7 C)  Resp: 18    Filed Weights   09/12/13 1309  Weight: 172 lb 11.2 oz (78.336 kg)    GENERAL:alert, no distress and comfortable SKIN: skin color, texture, turgor are normal, no rashes or significant lesions EYES: normal, Conjunctiva are pink and non-injected, sclera clear OROPHARYNX:no exudate, no erythema and lips, buccal mucosa, and tongue normal  NECK: supple, thyroid normal size, non-tender, without nodularity LYMPH:  no palpable lymphadenopathy in the cervical, axillary or inguinal LUNGS: clear to auscultation and percussion with normal breathing effort HEART: regular rate & rhythm and no murmurs and no lower extremity edema ABDOMEN:abdomen soft, non-tender and normal bowel sounds Musculoskeletal:no cyanosis of digits and no clubbing  NEURO: alert & oriented x 3 with fluent speech, no focal motor/sensory deficits  LABORATORY DATA:  I have reviewed the data as listed    Component Value Date/Time   NA 143 08/30/2013 1317   NA 141 08/16/2013 1504   K 3.7 08/30/2013 1317   K 3.5 08/16/2013 1504   CL 108 08/16/2013 1504   CL 111* 03/29/2013 1351   CO2 22 08/30/2013 1317   CO2 25 08/16/2013 1504   GLUCOSE 90 08/30/2013 1317   GLUCOSE 113* 08/16/2013 1504   GLUCOSE 129* 03/29/2013 1351   BUN 17.6 08/30/2013 1317   BUN 15 08/16/2013 1504   CREATININE 0.9 08/30/2013 1317   CREATININE 1.03 08/16/2013 1504   CREATININE 1.00 05/02/2013 1412   CALCIUM 8.7 08/30/2013 1317   CALCIUM 9.1 08/16/2013 1504   PROT 6.3* 08/30/2013 1317   PROT 6.4 08/16/2013 1504   ALBUMIN 3.3* 08/30/2013 1317   ALBUMIN 3.4* 08/16/2013 1504   AST 18 08/30/2013 1317   AST 21 08/16/2013 1504   ALT 20 08/30/2013 1317   ALT 20 08/16/2013 1504   ALKPHOS 99 08/30/2013 1317   ALKPHOS 99 08/16/2013 1504   BILITOT 0.38 08/30/2013 1317   BILITOT 0.2* 08/16/2013 1504   GFRNONAA 12* 02/04/2013 0850   GFRAA 14* 02/04/2013 0850    No results found for this basename: SPEP,  UPEP,   kappa and lambda light chains    Lab Results  Component  Value Date   WBC 5.3 08/30/2013   NEUTROABS 2.8 08/30/2013   HGB 11.8 08/30/2013   HCT 35.9 08/30/2013   MCV 102.0* 08/30/2013   PLT 218 08/30/2013      Chemistry      Component Value Date/Time   NA 143 08/30/2013 1317   NA 141 08/16/2013 1504   K 3.7 08/30/2013 1317   K 3.5 08/16/2013 1504   CL 108 08/16/2013 1504   CL 111* 03/29/2013 1351   CO2 22 08/30/2013 1317   CO2 25 08/16/2013 1504   BUN 17.6 08/30/2013 1317   BUN 15 08/16/2013 1504   CREATININE 0.9 08/30/2013 1317   CREATININE 1.03 08/16/2013 1504   CREATININE 1.00 05/02/2013 1412      Component Value Date/Time   CALCIUM 8.7 08/30/2013 1317   CALCIUM 9.1 08/16/2013 1504   ALKPHOS 99 08/30/2013 1317   ALKPHOS 99 08/16/2013 1504   AST 18 08/30/2013 1317   AST 21 08/16/2013  1504   ALT 20 08/30/2013 1317   ALT 20 08/16/2013 1504   BILITOT 0.38 08/30/2013 1317   BILITOT 0.2* 08/16/2013 1504      ASSESSMENT & PLAN:  #1 multiple myeloma The patient has received induction chemotherapy with Velcade dexamethasone and Revlimid. She has achieved very good partial response. We are awaiting insurance clearance to get her a bone marrow transplant evaluation. In the meantime we'll continue on Revlimid only, 15 mg by mouth for 3 weeks, off 1 week. She just started her new cycle on 09/08/2013. I plan to see her back in about a month. We will repeat blood work then. I will also continue maintenance Zometa once a month. I will schedule this on the day of her return visit. #2 HIV infection This is not active. She will continue her entire retroviral therapy. #3 hypertension The patient is asymptomatic. We will observe. #4 antimicrobial prophylaxis She will continue acyclovir for minimum of 6 months after discontinuation of Velcade #5 peripheral neuropathy She had peripheral neuropathy likely HIV associated before chemotherapy. While she was on Velcade, and it got worse. At present time I will observe only.  All questions were answered.  The patient knows to call the clinic with any problems, questions or concerns. No barriers to learning was detected. I spent 25 minutes counseling the patient face to face. The total time spent in the appointment was 40 minutes and more than 50% was on counseling and review of test results     [facility], Roberto Hlavaty, MD 09/12/2013 1:51 PM

## 2013-09-18 ENCOUNTER — Ambulatory Visit (INDEPENDENT_AMBULATORY_CARE_PROVIDER_SITE_OTHER): Payer: No Typology Code available for payment source | Admitting: Infectious Disease

## 2013-09-18 ENCOUNTER — Encounter: Payer: Self-pay | Admitting: Infectious Disease

## 2013-09-18 VITALS — BP 149/88 | HR 93 | Temp 98.1°F | Wt 171.0 lb

## 2013-09-18 DIAGNOSIS — Z23 Encounter for immunization: Secondary | ICD-10-CM

## 2013-09-18 DIAGNOSIS — C9000 Multiple myeloma not having achieved remission: Secondary | ICD-10-CM

## 2013-09-18 DIAGNOSIS — N179 Acute kidney failure, unspecified: Secondary | ICD-10-CM

## 2013-09-18 DIAGNOSIS — B20 Human immunodeficiency virus [HIV] disease: Secondary | ICD-10-CM

## 2013-09-18 MED ORDER — ABACAVIR-DOLUTEGRAVIR-LAMIVUD 600-50-300 MG PO TABS: 1.0000 | ORAL_TABLET | Freq: Every day | ORAL | Status: DC

## 2013-09-18 NOTE — Progress Notes (Signed)
  Subjective:    Patient ID: Jennifer Boyle, female    DOB: 10-07-1966, 47 y.o.   MRN: [id number]  HPI   Jennifer Boyle is a 47 y.o. female with HIV infection who is doing superbly well on her  antiviral regimen, with undetectable viral load and health cd4 count when last checked. She has been diagnosed with multiple myeloma and is receiving chemotherapy through the cancer Center. When last checked her renal function  To normal and she was consolidated to  [facility] and Epzicom. Did try to place her on TRIUMEQ but [employer] had not yet approved it. I will do so today.    Review of Systems  Constitutional: Negative for fever, chills, diaphoresis, activity change, appetite change, fatigue and unexpected weight change.  HENT: Negative for congestion, rhinorrhea, sinus pressure, sneezing, sore throat and trouble swallowing.   Eyes: Negative for photophobia and visual disturbance.  Respiratory: Negative for cough, chest tightness, shortness of breath, wheezing and stridor.   Cardiovascular: Negative for chest pain, palpitations and leg swelling.  Gastrointestinal: Negative for nausea, vomiting, abdominal pain, diarrhea, constipation, blood in stool, abdominal distention and anal bleeding.  Genitourinary: Negative for dysuria, hematuria, flank pain and difficulty urinating.  Musculoskeletal: Negative for arthralgias, back pain, gait problem, joint swelling and myalgias.  Skin: Negative for color change, pallor, rash and wound.  Neurological: Negative for dizziness, tremors, weakness and light-headedness.  Hematological: Negative for adenopathy. Does not bruise/bleed easily.  Psychiatric/Behavioral: Negative for behavioral problems, confusion, sleep disturbance, dysphoric mood, decreased concentration and agitation.       Objective:   Physical Exam  Constitutional: She is oriented to person, place, and time. She appears well-developed and well-nourished. No distress.  HENT:  Head: Normocephalic and  atraumatic.  Mouth/Throat: Oropharynx is clear and moist. No oropharyngeal exudate.  Eyes: Conjunctivae and EOM are normal. Pupils are equal, round, and reactive to light. No scleral icterus.  Neck: Normal range of motion. Neck supple. No JVD present.  Cardiovascular: Normal rate, regular rhythm and normal heart sounds.  Exam reveals no gallop and no friction rub.   No murmur heard. Pulmonary/Chest: Effort normal and breath sounds normal. No respiratory distress. She has no wheezes. She has no rales. She exhibits no tenderness.  Abdominal: She exhibits no distension and no mass. There is no tenderness. There is no rebound and no guarding.  Musculoskeletal: She exhibits no edema and no tenderness.  Lymphadenopathy:    She has no cervical adenopathy.  Neurological: She is alert and oriented to person, place, and time. She exhibits normal muscle tone. Coordination normal.  Skin: Skin is warm and dry. She is not diaphoretic. No erythema. No pallor.  Psychiatric: She has a normal mood and affect. Her behavior is normal. Judgment and thought content normal.          Assessment & Plan:  HIV: change to TRIUMEQ. I spent greater than 20 minutes with the patient including greater than 50% of time in face to face counsel of the patient and in coordination of their care.   Multiple myeloma: following at cancer center  Renal failure: due to #2 and resolved   Need for flu: she has had flu vaccine

## 2013-09-23 ENCOUNTER — Other Ambulatory Visit: Payer: Self-pay | Admitting: *Deleted

## 2013-09-23 NOTE — Telephone Encounter (Signed)
THIS REFILL REQUEST FOR REVLIMID WAS GIVEN TO DR.GORSUCH'S NURSE, CAMEO WINDHAM,RN. 

## 2013-09-25 ENCOUNTER — Other Ambulatory Visit: Payer: Self-pay | Admitting: *Deleted

## 2013-09-25 NOTE — Telephone Encounter (Signed)
Refill request to MD desk for approval upon next visit 09/30/13. Will require urine pregnancy test to refill. Called [facility] to inform.

## 2013-09-30 ENCOUNTER — Telehealth: Payer: Self-pay | Admitting: Hematology and Oncology

## 2013-09-30 ENCOUNTER — Encounter: Payer: Self-pay | Admitting: Hematology and Oncology

## 2013-09-30 ENCOUNTER — Ambulatory Visit (HOSPITAL_BASED_OUTPATIENT_CLINIC_OR_DEPARTMENT_OTHER): Payer: No Typology Code available for payment source | Admitting: Hematology and Oncology

## 2013-09-30 ENCOUNTER — Other Ambulatory Visit (HOSPITAL_BASED_OUTPATIENT_CLINIC_OR_DEPARTMENT_OTHER): Payer: No Typology Code available for payment source

## 2013-09-30 ENCOUNTER — Ambulatory Visit (HOSPITAL_BASED_OUTPATIENT_CLINIC_OR_DEPARTMENT_OTHER): Payer: No Typology Code available for payment source

## 2013-09-30 ENCOUNTER — Other Ambulatory Visit: Payer: Self-pay | Admitting: *Deleted

## 2013-09-30 ENCOUNTER — Ambulatory Visit: Payer: No Typology Code available for payment source

## 2013-09-30 VITALS — BP 149/84 | HR 83 | Temp 97.7°F | Resp 18 | Ht 64.0 in | Wt 172.5 lb

## 2013-09-30 DIAGNOSIS — C9000 Multiple myeloma not having achieved remission: Secondary | ICD-10-CM

## 2013-09-30 DIAGNOSIS — I10 Essential (primary) hypertension: Secondary | ICD-10-CM

## 2013-09-30 DIAGNOSIS — D4989 Neoplasm of unspecified behavior of other specified sites: Secondary | ICD-10-CM

## 2013-09-30 DIAGNOSIS — G609 Hereditary and idiopathic neuropathy, unspecified: Secondary | ICD-10-CM

## 2013-09-30 DIAGNOSIS — B20 Human immunodeficiency virus [HIV] disease: Secondary | ICD-10-CM

## 2013-09-30 LAB — COMPREHENSIVE METABOLIC PANEL (CC13)
Albumin: 3.6 g/dL (ref 3.5–5.0)
Alkaline Phosphatase: 101 U/L (ref 40–150)
BUN: 18.1 mg/dL (ref 7.0–26.0)
Calcium: 9.2 mg/dL (ref 8.4–10.4)
Potassium: 3.8 mEq/L (ref 3.5–5.1)
Total Protein: 6.7 g/dL (ref 6.4–8.3)

## 2013-09-30 LAB — CBC WITH DIFFERENTIAL/PLATELET
Basophils Absolute: 0 10*3/uL (ref 0.0–0.1)
EOS%: 6.2 % (ref 0.0–7.0)
Eosinophils Absolute: 0.3 10*3/uL (ref 0.0–0.5)
HCT: 35.9 % (ref 34.8–46.6)
HGB: 12 g/dL (ref 11.6–15.9)
MCH: 33 pg (ref 25.1–34.0)
MCV: 98.6 fL (ref 79.5–101.0)
MONO#: 0.7 10*3/uL (ref 0.1–0.9)
NEUT#: 2.6 10*3/uL (ref 1.5–6.5)
NEUT%: 48.7 % (ref 38.4–76.8)
RBC: 3.64 10*6/uL — ABNORMAL LOW (ref 3.70–5.45)
lymph#: 1.7 10*3/uL (ref 0.9–3.3)

## 2013-09-30 LAB — PREGNANCY, URINE: Preg Test, Ur: NEGATIVE

## 2013-09-30 MED ORDER — HEPARIN SOD (PORK) LOCK FLUSH 100 UNIT/ML IV SOLN
500.0000 [IU] | Freq: Once | INTRAVENOUS | Status: AC
  Administered 2013-09-30: 500 [IU] via INTRAVENOUS
  Filled 2013-09-30: qty 5

## 2013-09-30 MED ORDER — SODIUM CHLORIDE 0.9 % IJ SOLN
10.0000 mL | INTRAMUSCULAR | Status: DC | PRN
  Administered 2013-09-30: 10 mL via INTRAVENOUS
  Filled 2013-09-30: qty 10

## 2013-09-30 MED ORDER — LENALIDOMIDE 15 MG PO CAPS: 15.0000 mg | ORAL_CAPSULE | Freq: Every day | ORAL | Status: DC

## 2013-09-30 MED ORDER — SODIUM CHLORIDE 0.9 % IV SOLN
INTRAVENOUS | Status: DC
  Administered 2013-09-30: 15:00:00 via INTRAVENOUS

## 2013-09-30 MED ORDER — ZOLEDRONIC ACID 4 MG/100ML IV SOLN
4.0000 mg | Freq: Once | INTRAVENOUS | Status: AC
  Administered 2013-09-30: 4 mg via INTRAVENOUS
  Filled 2013-09-30: qty 100

## 2013-09-30 NOTE — Patient Instructions (Signed)

## 2013-09-30 NOTE — Telephone Encounter (Signed)
gv and printed appt sched and avs for pt for Jan 2015  sed added tx. °

## 2013-09-30 NOTE — Progress Notes (Signed)
[facility] OFFICE PROGRESS NOTE  Patient Care Team: Randall Hiss, MD as PCP - General (Infectious Diseases) Cliffton Asters, MD as PCP - Infectious Diseases (Infectious Diseases) Hartley Barefoot. Allena Katz, MD (Nephrology) Artis Delay, MD as Consulting Physician (Hematology and Oncology) Artis Delay, MD as Consulting Physician (Hematology and Oncology) Hartley Barefoot. Allena Katz, MD as Consulting Physician (Nephrology)  DIAGNOSIS: IgG kappa multiple myeloma, on maintenance Revlimid and Zometa  SUMMARY OF ONCOLOGIC HISTORY: This is a very pleasant 47 year old lady who presented with anemia, acute renal failure, and bony abnormalities. Bone marrow aspirate and biopsy in April 2014 show 40% plasma cell involvement and an M spike of over 5 g. She has complex cytogenetics on the bone marrow. The patient was initially treated with Velcade, dexamethasone in April 2014.  In May of 2014 Zometa was started after she obtained dental clearance. Revlimid was subsequently added when her kidney function so to normalize.  In October 2014, the patient has achieved very good partial response In November 2014, we discontinue Velcade due to worsening peripheral neuropathy. We also discontinue dexamethasone. Her anticoagulation management was switched from warfarin to aspirin  INTERVAL HISTORY: Jennifer Boyle 47 y.o. female returns for further followup. According to the patient, she still awaiting for approval to get Medicaid. She denies any bone pain. She said her persistent peripheral neuropathy but they were not worse. She denies any recent fever, chills, night sweats or abnormal weight loss  I have reviewed the past medical history, past surgical history, social history and family history with the patient and they are unchanged from previous note.  ALLERGIES:  is allergic to aspirin and efavirenz.  MEDICATIONS:  Current Outpatient Prescriptions  Medication Sig Dispense Refill  . Abacavir-Dolutegravir-Lamivud  (TRIUMEQ) 600-50-300 MG TABS Take 1 tablet by mouth daily.  30 tablet  11  . acyclovir (ZOVIRAX) 400 MG tablet Take 0.5 tablets (200 mg total) by mouth daily.  60 tablet  3  . aspirin 81 MG tablet Take 81 mg by mouth daily.      . cholecalciferol (VITAMIN D) 1000 UNITS tablet Take 1 tablet (1,000 Units total) by mouth daily.  30 tablet  3  . docusate sodium (COLACE) 100 MG capsule Take 100 mg by mouth 2 (two) times daily.      [name] lenalidomide (REVLIMID) 15 MG capsule Take 1 capsule (15 mg total) by mouth daily. Rest 7 days and repeat  21 capsule  0  . lidocaine-prilocaine (EMLA) cream Apply topically as needed. Apply to portacath one hour before chemo access or blood draw.  30 g  3  . polyethylene glycol (MIRALAX / GLYCOLAX) packet Take 17 g by mouth daily.      . prochlorperazine (COMPAZINE) 10 MG tablet Take 1 tablet (10 mg total) by mouth every 6 (six) hours as needed (Nausea or vomiting).  30 tablet  1  . promethazine (PHENERGAN) 25 MG tablet Take 1 tablet (25 mg total) by mouth every 6 (six) hours as needed for nausea.  30 tablet  0  . sodium bicarbonate 650 MG tablet Take 1 tablet (650 mg total) by mouth 3 (three) times daily.  90 tablet  3   No current facility-administered medications for this visit.    REVIEW OF SYSTEMS:   Constitutional: Denies fevers, chills or abnormal weight loss Eyes: Denies blurriness of vision Ears, nose, mouth, throat, and face: Denies mucositis or sore throat Respiratory: Denies cough, dyspnea or wheezes Cardiovascular: Denies palpitation, chest discomfort or lower extremity swelling Gastrointestinal:  Denies nausea, heartburn or change in bowel habits Skin: Denies abnormal skin rashes Lymphatics: Denies new lymphadenopathy or easy bruising Behavioral/Psych: Mood is stable, no new changes  All other systems were reviewed with the patient and are negative.  PHYSICAL EXAMINATION: ECOG PERFORMANCE STATUS: 0 - Asymptomatic  Filed Vitals:   09/30/13 1311   BP: 149/84  Pulse: 83  Temp: 97.7 F (36.5 C)  Resp: 18   Filed Weights   09/30/13 1311  Weight: 172 lb 8 oz (78.245 kg)    GENERAL:alert, no distress and comfortable SKIN: skin color, texture, turgor are normal, no rashes or significant lesions EYES: normal, Conjunctiva are pink and non-injected, sclera clear OROPHARYNX:no exudate, no erythema and lips, buccal mucosa, and tongue normal  NECK: supple, thyroid normal size, non-tender, without nodularity LYMPH:  no palpable lymphadenopathy in the cervical, axillary or inguinal LUNGS: clear to auscultation and percussion with normal breathing effort HEART: regular rate & rhythm and no murmurs and no lower extremity edema ABDOMEN:abdomen soft, non-tender and normal bowel sounds Musculoskeletal:no cyanosis of digits and no clubbing  NEURO: alert & oriented x 3 with fluent speech, no focal motor/sensory deficits  LABORATORY DATA:  I have reviewed the data as listed    Component Value Date/Time   NA 143 09/30/2013 1245   NA 141 08/16/2013 1504   K 3.8 09/30/2013 1245   K 3.5 08/16/2013 1504   CL 108 08/16/2013 1504   CL 111* 03/29/2013 1351   CO2 23 09/30/2013 1245   CO2 25 08/16/2013 1504   GLUCOSE 110 09/30/2013 1245   GLUCOSE 113* 08/16/2013 1504   GLUCOSE 129* 03/29/2013 1351   BUN 18.1 09/30/2013 1245   BUN 15 08/16/2013 1504   CREATININE 1.0 09/30/2013 1245   CREATININE 1.03 08/16/2013 1504   CREATININE 1.00 05/02/2013 1412   CALCIUM 9.2 09/30/2013 1245   CALCIUM 9.1 08/16/2013 1504   PROT 6.7 09/30/2013 1245   PROT 6.4 08/16/2013 1504   ALBUMIN 3.6 09/30/2013 1245   ALBUMIN 3.4* 08/16/2013 1504   AST 25 09/30/2013 1245   AST 21 08/16/2013 1504   ALT 21 09/30/2013 1245   ALT 20 08/16/2013 1504   ALKPHOS 101 09/30/2013 1245   ALKPHOS 99 08/16/2013 1504   BILITOT 0.35 09/30/2013 1245   BILITOT 0.2* 08/16/2013 1504   GFRNONAA 12* 02/04/2013 0850   GFRAA 14* 02/04/2013 0850    No results found for this basename: SPEP,  UPEP,    kappa and lambda light chains    Lab Results  Component Value Date   WBC 5.3 09/30/2013   NEUTROABS 2.6 09/30/2013   HGB 12.0 09/30/2013   HCT 35.9 09/30/2013   MCV 98.6 09/30/2013   PLT 205 09/30/2013      Chemistry      Component Value Date/Time   NA 143 09/30/2013 1245   NA 141 08/16/2013 1504   K 3.8 09/30/2013 1245   K 3.5 08/16/2013 1504   CL 108 08/16/2013 1504   CL 111* 03/29/2013 1351   CO2 23 09/30/2013 1245   CO2 25 08/16/2013 1504   BUN 18.1 09/30/2013 1245   BUN 15 08/16/2013 1504   CREATININE 1.0 09/30/2013 1245   CREATININE 1.03 08/16/2013 1504   CREATININE 1.00 05/02/2013 1412      Component Value Date/Time   CALCIUM 9.2 09/30/2013 1245   CALCIUM 9.1 08/16/2013 1504   ALKPHOS 101 09/30/2013 1245   ALKPHOS 99 08/16/2013 1504   AST 25 09/30/2013 1245   AST 21  08/16/2013 1504   ALT 21 09/30/2013 1245   ALT 20 08/16/2013 1504   BILITOT 0.35 09/30/2013 1245   BILITOT 0.2* 08/16/2013 1504      ASSESSMENT & PLAN:  #1 multiple myeloma The patient has received induction chemotherapy with Velcade, dexamethasone and Revlimid. She has achieved very good partial response. We are awaiting insurance clearance to get her a bone marrow transplant evaluation. In the meantime we'll continue on Revlimid only, 15 mg by mouth for 3 weeks, off 1 week. She just started her new cycle on 09/08/2013. I plan to see her back in about a month. We will repeat blood work then. I will also continue maintenance Zometa once a month.  #2 HIV infection This is not active. She will continue her antiretroviral therapy. #3 hypertension The patient is asymptomatic. We will observe. #4 antimicrobial prophylaxis She will continue acyclovir for minimum of 6 months after discontinuation of Velcade #5 peripheral neuropathy She had peripheral neuropathy likely HIV associated before chemotherapy. While she was on Velcade, and it got worse. At present time I will observe only. All questions were  answered. The patient knows to call the clinic with any problems, questions or concerns. No barriers to learning was detected. I spent 25 minutes counseling the patient face to face. The total time spent in the appointment was 40 minutes and more than 50% was on counseling and review of test results     [facility] Treveon Bourcier, MD 09/30/2013 2:11 PM

## 2013-10-02 LAB — SPEP & IFE WITH QIG
Alpha-2-Globulin: 11.5 % (ref 7.1–11.8)
Beta Globulin: 5.9 % (ref 4.7–7.2)
IgA: 123 mg/dL (ref 69–380)
IgG (Immunoglobin G), Serum: 943 mg/dL (ref 690–1700)
IgM, Serum: 18 mg/dL — ABNORMAL LOW (ref 52–322)
M-Spike, %: 0.29 g/dL
Total Protein, Serum Electrophoresis: 6.4 g/dL (ref 6.0–8.3)

## 2013-10-02 LAB — BETA 2 MICROGLOBULIN, SERUM: Beta-2 Microglobulin: 1.62 mg/L (ref 1.01–1.73)

## 2013-10-02 NOTE — Telephone Encounter (Signed)
[facility] sent facsimile confirmation of Revlimid prescription shipment.  Revlimid was shipped on 10-01-2013 with next business day delivery.

## 2013-10-14 ENCOUNTER — Other Ambulatory Visit: Payer: Self-pay | Admitting: *Deleted

## 2013-10-14 DIAGNOSIS — B20 Human immunodeficiency virus [HIV] disease: Secondary | ICD-10-CM

## 2013-10-14 MED ORDER — SODIUM BICARBONATE 650 MG PO TABS: 650.0000 mg | ORAL_TABLET | Freq: Three times a day (TID) | ORAL | Status: DC

## 2013-10-25 ENCOUNTER — Other Ambulatory Visit: Payer: Self-pay | Admitting: Hematology and Oncology

## 2013-10-25 ENCOUNTER — Other Ambulatory Visit: Payer: Self-pay | Admitting: *Deleted

## 2013-10-25 NOTE — Telephone Encounter (Signed)
THIS REFILL REQUEST FOR REVLIMID WAS GIVEN TO DR.GORSUCH'S NURSE, TAMMI HOLLAND,RN.

## 2013-10-28 ENCOUNTER — Ambulatory Visit: Payer: Self-pay

## 2013-10-28 ENCOUNTER — Telehealth: Payer: Self-pay | Admitting: Hematology and Oncology

## 2013-10-28 ENCOUNTER — Ambulatory Visit: Payer: Self-pay | Admitting: Hematology and Oncology

## 2013-10-28 ENCOUNTER — Other Ambulatory Visit: Payer: Self-pay

## 2013-10-28 ENCOUNTER — Telehealth: Payer: Self-pay | Admitting: *Deleted

## 2013-10-28 NOTE — Telephone Encounter (Signed)
Informed pt of all appts from today will be r/s to next week on Monday.   Pt asks if she should come in for labs at the end of this week if she feels better so Revlimid can be refilled.  Instructed pt to call back if feeling better and we can get labs sooner if ok w/ Dr. Alvy Bimler.   Meanwhile, instructed pt to drink plenty of fluids and go to ED if she cannot keep any fluids down or her condition worsens.  Pt verbalized understanding.

## 2013-10-28 NOTE — Telephone Encounter (Signed)
Sent michelle a staff message to r/s the zometa appt to 11/04/2013.

## 2013-10-28 NOTE — Telephone Encounter (Signed)
S/w the pt and she is aware of her appts on 11/04/2013 for now. Pt was told by the nurse to call ahead if she feels better and maybe she can be seen on Friday. Advised the pt to make sure that they can see her this week and if the can we can always cance the appt for 11/04/2013 if she is seen sooner towards the end of this week.

## 2013-10-28 NOTE — Telephone Encounter (Signed)
Everything you had suggested is correct If patient cannot adequately hydrate herself despite anti-emetics, I suggest she goes to the ER

## 2013-10-28 NOTE — Telephone Encounter (Signed)
Pt states has a "stomach virus"  Started yesterday afternoon with diarrhea x 4 and nausea w/o vomiting.   Has had diarrhea x 2 today and still nauseated.  Denies fever, cough or congestion.  States able to drink water but feels like it "goes right through me."   Pt states she will not make it in for her appts today.   Suggested pt keep appts to see MD and possible IVFs if needed.  Pt states she just doesn't think there is any way she can get here feeling nauseated.   She does have compazine and phenergan at home.  Instructed she push fluids and take either compazine or phenergan as needed for nausea.  Will call her back after report to Dr. Alvy Bimler.

## 2013-10-29 ENCOUNTER — Telehealth: Payer: Self-pay | Admitting: *Deleted

## 2013-10-29 NOTE — Telephone Encounter (Signed)
Per staff message and POF I have scheduled appts.  JMW  

## 2013-10-30 ENCOUNTER — Telehealth: Payer: Self-pay | Admitting: *Deleted

## 2013-10-30 NOTE — Telephone Encounter (Signed)
Pt left VM states feeling much better.  She thinks she had a "24 hr bug."  She missed her appts on Monday this week and wants to know if she can come in tomorrow for labs and see Dr. Alvy Bimler?

## 2013-10-30 NOTE — Telephone Encounter (Signed)
I have no availability Tell her it's scheduled for next week and it's not a bad idea to wait until then

## 2013-10-30 NOTE — Telephone Encounter (Signed)
[facility] faxed Revlimid refill request for.  Request to provider in basket for review.

## 2013-10-30 NOTE — Telephone Encounter (Signed)
Instructed pt better to wait for appts until Monday 1/26.  She verbalized understanding.

## 2013-11-04 ENCOUNTER — Telehealth: Payer: Self-pay | Admitting: Hematology and Oncology

## 2013-11-04 ENCOUNTER — Ambulatory Visit: Payer: No Typology Code available for payment source

## 2013-11-04 ENCOUNTER — Ambulatory Visit (HOSPITAL_BASED_OUTPATIENT_CLINIC_OR_DEPARTMENT_OTHER): Payer: No Typology Code available for payment source

## 2013-11-04 ENCOUNTER — Telehealth: Payer: Self-pay | Admitting: *Deleted

## 2013-11-04 ENCOUNTER — Ambulatory Visit (HOSPITAL_BASED_OUTPATIENT_CLINIC_OR_DEPARTMENT_OTHER): Payer: No Typology Code available for payment source | Admitting: Hematology and Oncology

## 2013-11-04 ENCOUNTER — Encounter: Payer: Self-pay | Admitting: Hematology and Oncology

## 2013-11-04 ENCOUNTER — Other Ambulatory Visit (HOSPITAL_BASED_OUTPATIENT_CLINIC_OR_DEPARTMENT_OTHER): Payer: 59

## 2013-11-04 VITALS — BP 143/82 | HR 74 | Temp 98.2°F | Resp 20 | Ht 64.0 in | Wt 171.7 lb

## 2013-11-04 DIAGNOSIS — C9000 Multiple myeloma not having achieved remission: Secondary | ICD-10-CM

## 2013-11-04 DIAGNOSIS — D4989 Neoplasm of unspecified behavior of other specified sites: Secondary | ICD-10-CM

## 2013-11-04 DIAGNOSIS — Z95828 Presence of other vascular implants and grafts: Secondary | ICD-10-CM

## 2013-11-04 DIAGNOSIS — G609 Hereditary and idiopathic neuropathy, unspecified: Secondary | ICD-10-CM

## 2013-11-04 DIAGNOSIS — B20 Human immunodeficiency virus [HIV] disease: Secondary | ICD-10-CM

## 2013-11-04 DIAGNOSIS — I10 Essential (primary) hypertension: Secondary | ICD-10-CM

## 2013-11-04 LAB — COMPREHENSIVE METABOLIC PANEL (CC13)
ALBUMIN: 3.6 g/dL (ref 3.5–5.0)
ALT: 24 U/L (ref 0–55)
ANION GAP: 9 meq/L (ref 3–11)
AST: 21 U/L (ref 5–34)
Alkaline Phosphatase: 90 U/L (ref 40–150)
BUN: 22.5 mg/dL (ref 7.0–26.0)
CO2: 21 meq/L — AB (ref 22–29)
CREATININE: 1 mg/dL (ref 0.6–1.1)
Calcium: 9.5 mg/dL (ref 8.4–10.4)
Chloride: 111 mEq/L — ABNORMAL HIGH (ref 98–109)
Glucose: 122 mg/dl (ref 70–140)
Potassium: 3.9 mEq/L (ref 3.5–5.1)
SODIUM: 141 meq/L (ref 136–145)
TOTAL PROTEIN: 6.7 g/dL (ref 6.4–8.3)
Total Bilirubin: 0.31 mg/dL (ref 0.20–1.20)

## 2013-11-04 LAB — CBC WITH DIFFERENTIAL/PLATELET
BASO%: 0.8 % (ref 0.0–2.0)
Basophils Absolute: 0 10*3/uL (ref 0.0–0.1)
EOS%: 1.8 % (ref 0.0–7.0)
Eosinophils Absolute: 0.1 10*3/uL (ref 0.0–0.5)
HCT: 37.1 % (ref 34.8–46.6)
HGB: 12.4 g/dL (ref 11.6–15.9)
LYMPH%: 43.6 % (ref 14.0–49.7)
MCH: 33.2 pg (ref 25.1–34.0)
MCHC: 33.4 g/dL (ref 31.5–36.0)
MCV: 99.2 fL (ref 79.5–101.0)
MONO#: 0.3 10*3/uL (ref 0.1–0.9)
MONO%: 6.1 % (ref 0.0–14.0)
NEUT#: 2.3 10*3/uL (ref 1.5–6.5)
NEUT%: 47.7 % (ref 38.4–76.8)
Platelets: 221 10*3/uL (ref 145–400)
RBC: 3.74 10*6/uL (ref 3.70–5.45)
RDW: 13.9 % (ref 11.2–14.5)
WBC: 4.9 10*3/uL (ref 3.9–10.3)
lymph#: 2.1 10*3/uL (ref 0.9–3.3)

## 2013-11-04 LAB — PREGNANCY, URINE: PREG TEST UR: NEGATIVE

## 2013-11-04 MED ORDER — LIDOCAINE-PRILOCAINE 2.5-2.5 % EX CREA
TOPICAL_CREAM | CUTANEOUS | Status: AC | PRN
Start: 2013-11-04 — End: ?

## 2013-11-04 MED ORDER — HEPARIN SOD (PORK) LOCK FLUSH 100 UNIT/ML IV SOLN
500.0000 [IU] | Freq: Once | INTRAVENOUS | Status: AC
Start: 2013-11-04 — End: 2013-11-04
  Administered 2013-11-04: 500 [IU] via INTRAVENOUS
  Filled 2013-11-04: qty 5

## 2013-11-04 MED ORDER — SODIUM CHLORIDE 0.9 % IV SOLN
Freq: Once | INTRAVENOUS | Status: AC
  Administered 2013-11-04: 13:00:00 via INTRAVENOUS

## 2013-11-04 MED ORDER — SODIUM CHLORIDE 0.9 % IJ SOLN
10.0000 mL | Freq: Once | INTRAMUSCULAR | Status: AC
  Administered 2013-11-04: 10 mL via INTRAVENOUS
  Filled 2013-11-04: qty 10

## 2013-11-04 MED ORDER — ACYCLOVIR 400 MG PO TABS: 200.0000 mg | ORAL_TABLET | Freq: Every day | ORAL | Status: DC

## 2013-11-04 MED ORDER — HEPARIN SOD (PORK) LOCK FLUSH 100 UNIT/ML IV SOLN
500.0000 [IU] | Freq: Once | INTRAVENOUS | Status: AC
  Administered 2013-11-04: 500 [IU] via INTRAVENOUS
  Filled 2013-11-04: qty 5

## 2013-11-04 MED ORDER — ZOLEDRONIC ACID 4 MG/100ML IV SOLN
4.0000 mg | Freq: Once | INTRAVENOUS | Status: AC
  Administered 2013-11-04: 4 mg via INTRAVENOUS
  Filled 2013-11-04: qty 100

## 2013-11-04 MED ORDER — SODIUM CHLORIDE 0.9 % IJ SOLN
10.0000 mL | INTRAMUSCULAR | Status: DC | PRN
  Administered 2013-11-04: 10 mL via INTRAVENOUS
  Filled 2013-11-04: qty 10

## 2013-11-04 NOTE — Telephone Encounter (Signed)
Called patient to let her know she has an appointment at [facility] for BMT eval on 11/14/13 @ 2:00 with Dr Shirleen Schirmer. Pt states she is waiting for her [place]

## 2013-11-04 NOTE — Progress Notes (Signed)
[place] OFFICE PROGRESS NOTE  Patient Care Team: Truman Hayward, MD as PCP - General (Infectious Diseases) Michel Bickers, MD as PCP - Infectious Diseases (Infectious Diseases) Clayborne Dana. Posey Pronto, MD (Nephrology) Heath Lark, MD as Consulting Physician (Hematology and Oncology) Heath Lark, MD as Consulting Physician (Hematology and Oncology) Clayborne Dana. Posey Pronto, MD as Consulting Physician (Nephrology)  DIAGNOSIS: IgG kappa multiple myeloma, on maintenance therapy with Revlimid and Zometa  SUMMARY OF ONCOLOGIC HISTORY: This is a very pleasant 48 year old lady who presented with anemia, acute renal failure, and bony abnormalities. Bone marrow aspirate and biopsy in April 2014 show 40% plasma cell involvement and an M spike of over 5 g. She has complex cytogenetics on the bone marrow. The patient was initially treated with Velcade, dexamethasone in April 2014.  In May of 2014 Zometa was started after she obtained dental clearance. Revlimid was subsequently added when her kidney function normalize.  In October 2014, the patient has achieved very good partial response In November 2014, we discontinue Velcade due to worsening peripheral neuropathy. We also discontinue dexamethasone. She remained on Revlimid 50 mg by mouth, 3 weeks on one week off along with Zometa once a month. Her anticoagulation management was switched from warfarin to aspirin  INTERVAL HISTORY: Jennifer Boyle 48 y.o. female returns for further followup. Last week, she had a 24-hour viral infection which caused nausea and severe diarrhea. It subsequently resolved. She is a persistent peripheral neuropathy but they were not worse. She denies any recent fever, chills, night sweats or abnormal weight loss  I have reviewed the past medical history, past surgical history, social history and family history with the patient and they are unchanged from previous note.  ALLERGIES:  is allergic to aspirin and  efavirenz.  MEDICATIONS:  Current Outpatient Prescriptions  Medication Sig Dispense Refill  . Abacavir-Dolutegravir-Lamivud (TRIUMEQ) 600-50-300 MG TABS Take 1 tablet by mouth daily.  30 tablet  11  . acyclovir (ZOVIRAX) 400 MG tablet Take 0.5 tablets (200 mg total) by mouth daily.  60 tablet  3  . aspirin 81 MG tablet Take 81 mg by mouth daily.      . cholecalciferol (VITAMIN D) 1000 UNITS tablet Take 1 tablet (1,000 Units total) by mouth daily.  30 tablet  3  . docusate sodium (COLACE) 100 MG capsule Take 100 mg by mouth 2 (two) times daily.      [name] lenalidomide (REVLIMID) 15 MG capsule Take 1 capsule (15 mg total) by mouth daily. Rest 7 days and repeat  21 capsule  0  . lidocaine-prilocaine (EMLA) cream Apply topically as needed. Apply to portacath one hour before chemo access or blood draw.  30 g  3  . polyethylene glycol (MIRALAX / GLYCOLAX) packet Take 17 g by mouth daily.      . prochlorperazine (COMPAZINE) 10 MG tablet Take 1 tablet (10 mg total) by mouth every 6 (six) hours as needed (Nausea or vomiting).  30 tablet  1  . promethazine (PHENERGAN) 25 MG tablet Take 1 tablet (25 mg total) by mouth every 6 (six) hours as needed for nausea.  30 tablet  0  . sodium bicarbonate 650 MG tablet Take 1 tablet (650 mg total) by mouth 3 (three) times daily.  90 tablet  3   No current facility-administered medications for this visit.    REVIEW OF SYSTEMS:   Constitutional: Denies fevers, chills or abnormal weight loss Eyes: Denies blurriness of vision Ears, nose, mouth, throat, and face: Denies  mucositis or sore throat Respiratory: Denies cough, dyspnea or wheezes Cardiovascular: Denies palpitation, chest discomfort or lower extremity swelling Skin: Denies abnormal skin rashes Lymphatics: Denies new lymphadenopathy or easy bruising Neurological:Denies numbness, tingling or new weaknesses Behavioral/Psych: Mood is stable, no new changes  All other systems were reviewed with the patient and  are negative.  PHYSICAL EXAMINATION: ECOG PERFORMANCE STATUS: 1 - Symptomatic but completely ambulatory  Filed Vitals:   11/04/13 1313  BP: 143/82  Pulse: 74  Temp: 98.2 F (36.8 C)  Resp: 20   Filed Weights   11/04/13 1313  Weight: 171 lb 11.2 oz (77.883 kg)    GENERAL:alert, no distress and comfortable SKIN: skin color, texture, turgor are normal, no rashes or significant lesions EYES: normal, Conjunctiva are pink and non-injected, sclera clear OROPHARYNX:no exudate, no erythema and lips, buccal mucosa, and tongue normal  NECK: supple, thyroid normal size, non-tender, without nodularity LYMPH:  no palpable lymphadenopathy in the cervical, axillary or inguinal LUNGS: clear to auscultation and percussion with normal breathing effort HEART: regular rate & rhythm and no murmurs and no lower extremity edema ABDOMEN:abdomen soft, non-tender and normal bowel sounds Musculoskeletal:no cyanosis of digits and no clubbing  NEURO: alert & oriented x 3 with fluent speech, no focal motor/sensory deficits  LABORATORY DATA:  I have reviewed the data as listed    Component Value Date/Time   NA 141 11/04/2013 1244   NA 141 08/16/2013 1504   K 3.9 11/04/2013 1244   K 3.5 08/16/2013 1504   CL 108 08/16/2013 1504   CL 111* 03/29/2013 1351   CO2 21* 11/04/2013 1244   CO2 25 08/16/2013 1504   GLUCOSE 122 11/04/2013 1244   GLUCOSE 113* 08/16/2013 1504   GLUCOSE 129* 03/29/2013 1351   BUN 22.5 11/04/2013 1244   BUN 15 08/16/2013 1504   CREATININE 1.0 11/04/2013 1244   CREATININE 1.03 08/16/2013 1504   CREATININE 1.00 05/02/2013 1412   CALCIUM 9.5 11/04/2013 1244   CALCIUM 9.1 08/16/2013 1504   PROT 6.7 11/04/2013 1244   PROT 6.4 08/16/2013 1504   ALBUMIN 3.6 11/04/2013 1244   ALBUMIN 3.4* 08/16/2013 1504   AST 21 11/04/2013 1244   AST 21 08/16/2013 1504   ALT 24 11/04/2013 1244   ALT 20 08/16/2013 1504   ALKPHOS 90 11/04/2013 1244   ALKPHOS 99 08/16/2013 1504   BILITOT 0.31 11/04/2013 1244   BILITOT 0.2*  08/16/2013 1504   GFRNONAA 12* 02/04/2013 0850   GFRAA 14* 02/04/2013 0850    No results found for this basename: SPEP,  UPEP,   kappa and lambda light chains    Lab Results  Component Value Date   WBC 4.9 11/04/2013   NEUTROABS 2.3 11/04/2013   HGB 12.4 11/04/2013   HCT 37.1 11/04/2013   MCV 99.2 11/04/2013   PLT 221 11/04/2013      Chemistry      Component Value Date/Time   NA 141 11/04/2013 1244   NA 141 08/16/2013 1504   K 3.9 11/04/2013 1244   K 3.5 08/16/2013 1504   CL 108 08/16/2013 1504   CL 111* 03/29/2013 1351   CO2 21* 11/04/2013 1244   CO2 25 08/16/2013 1504   BUN 22.5 11/04/2013 1244   BUN 15 08/16/2013 1504   CREATININE 1.0 11/04/2013 1244   CREATININE 1.03 08/16/2013 1504   CREATININE 1.00 05/02/2013 1412      Component Value Date/Time   CALCIUM 9.5 11/04/2013 1244   CALCIUM 9.1 08/16/2013 1504  ALKPHOS 90 11/04/2013 1244   ALKPHOS 99 08/16/2013 1504   AST 21 11/04/2013 1244   AST 21 08/16/2013 1504   ALT 24 11/04/2013 1244   ALT 20 08/16/2013 1504   BILITOT 0.31 11/04/2013 1244   BILITOT 0.2* 08/16/2013 1504     ASSESSMENT & PLAN:  #1 multiple myeloma The patient has received induction chemotherapy with Velcade, dexamethasone and Revlimid. She has achieved very good partial response. There is a delay of referral of the patient the bone marrow transplant service due to insurance issue. According to the patient, she has insurance approval recently to get her medical insurance. I will proceed with referral to a bone marrow transplant as soon as possible In the meantime we'll continue on Revlimid only, 15 mg by mouth for 3 weeks, off 1 week. I plan to see her back in about a month. We will repeat blood work then. I will also continue maintenance Zometa once a month.  #2 HIV infection This is not active. She will continue her antiretroviral therapy. #3 hypertension The patient is asymptomatic. We will observe. #4 antimicrobial prophylaxis She will continue acyclovir for minimum  of 6 months after discontinuation of Velcade #5 peripheral neuropathy She had peripheral neuropathy likely HIV associated before chemotherapy. While she was on Velcade, and it got worse. At present time I will observe only. #6 DVT prophylaxis I recommend she continue on aspirin  Orders Placed This Encounter  Procedures  . Kappa/lambda light chains    Standing Status: Future     Number of Occurrences:      Standing Expiration Date: 11/04/2014  . SPEP & IFE with QIG    Standing Status: Future     Number of Occurrences:      Standing Expiration Date: 11/04/2014   All questions were answered. The patient knows to call the clinic with any problems, questions or concerns. No barriers to learning was detected.    [facility], [place], MD 11/04/2013 1:37 PM

## 2013-11-04 NOTE — Patient Instructions (Signed)

## 2013-11-04 NOTE — Telephone Encounter (Signed)
gv and printed appt sched and avs for pt for Feb...sed added tx.   °

## 2013-11-04 NOTE — Patient Instructions (Signed)

## 2013-11-05 ENCOUNTER — Ambulatory Visit: Payer: Self-pay | Admitting: Hematology and Oncology

## 2013-11-05 ENCOUNTER — Telehealth: Payer: Self-pay | Admitting: Hematology and Oncology

## 2013-11-05 ENCOUNTER — Other Ambulatory Visit: Payer: Self-pay | Admitting: *Deleted

## 2013-11-05 DIAGNOSIS — D4989 Neoplasm of unspecified behavior of other specified sites: Secondary | ICD-10-CM

## 2013-11-05 MED ORDER — LENALIDOMIDE 15 MG PO CAPS: 15.0000 mg | ORAL_CAPSULE | Freq: Every day | ORAL | Status: DC

## 2013-11-05 NOTE — Telephone Encounter (Signed)
RECEIVED A FAX FROM [facility] CONCERNING A CONFIRMATION OF FACSIMILE RECEIPT FOR PT. REFERRAL. 

## 2013-11-05 NOTE — Telephone Encounter (Signed)
Faxed pt medical records to [facility]. Slides and scans will be fedex'ed °

## 2013-11-06 LAB — SPEP & IFE WITH QIG
ALBUMIN ELP: 60.2 % (ref 55.8–66.1)
ALPHA-2-GLOBULIN: 11.8 % (ref 7.1–11.8)
Alpha-1-Globulin: 4 % (ref 2.9–4.9)
BETA 2: 3.3 % (ref 3.2–6.5)
BETA GLOBULIN: 6.1 % (ref 4.7–7.2)
Gamma Globulin: 14.6 % (ref 11.1–18.8)
IGA: 127 mg/dL (ref 69–380)
IgG (Immunoglobin G), Serum: 1010 mg/dL (ref 690–1700)
IgM, Serum: 19 mg/dL — ABNORMAL LOW (ref 52–322)
M-Spike, %: 0.33 g/dL
TOTAL PROTEIN, SERUM ELECTROPHOR: 6.4 g/dL (ref 6.0–8.3)

## 2013-11-06 LAB — HCG, SERUM, QUALITATIVE: Preg, Serum: NEGATIVE

## 2013-11-06 LAB — BETA 2 MICROGLOBULIN, SERUM: Beta-2 Microglobulin: 1.36 mg/L (ref 1.01–1.73)

## 2013-11-06 NOTE — Telephone Encounter (Signed)
[facility] sent facsimile confirmation of Revlimid prescription shipment.  Revlimid was shipped on 11-05-2013 with next business day delivery.

## 2013-11-11 ENCOUNTER — Encounter: Payer: Self-pay | Admitting: Pharmacist

## 2013-11-11 NOTE — Progress Notes (Signed)
Fax received in pharmacy from [facility] stating pt has been inactivated from [place] due to 2 or more months having lapsed since an order placed for Velcade shipment. Noted that Velcade was d/c'd in 08/2013 due to worsening peripheral neuropathy.  Pt currently on Revlimid + Zometa. Copy of fax placed in pts pharmacy chart & also copy sent to medical records. Kennith Center, Pharm.D., CPP 11/11/2013@8 :20 AM

## 2013-11-12 ENCOUNTER — Telehealth: Payer: Self-pay | Admitting: *Deleted

## 2013-11-12 NOTE — Telephone Encounter (Signed)
Pt states [employer] needs referral from PCP to go to [facility].  She has not seen PCP,  Dr. Jeanie Cooks in several years due to being treated primarily by Dr. Tommy Medal and Dr. Alvy Bimler.   Called Dr. Lucianne Lei Dam's office and unfortunately they cannot make referral as he is also specialist and not PCP.   Called Dr. Santiago Bur office and they request pt be seen in office to re-establish care w/ PCP in order for him to be able to make necessary referrals.  Informed pt of above.  Instructed her to contact Dr. Santiago Bur office for appt asap for above reasons.  She verbalized understanding and will do this.  She also plans to call [facility] to cancel appt there for now until she knows she has the referral.   Faxed over recent office notes to Dr. Santiago Bur office.

## 2013-11-18 ENCOUNTER — Telehealth: Payer: Self-pay | Admitting: *Deleted

## 2013-11-18 NOTE — Telephone Encounter (Signed)
Pt called to say [facility] has stated she will need to go to [facility] to be seen rather than [facility].

## 2013-11-19 ENCOUNTER — Telehealth: Payer: Self-pay | Admitting: *Deleted

## 2013-11-20 ENCOUNTER — Encounter: Payer: Self-pay | Admitting: Infectious Disease

## 2013-11-20 NOTE — Telephone Encounter (Signed)
Received call yesterday from Lilly at [facility] states pt's insurance will not cover BMT at [facility],  She recommends referral made to [facility].  States they take [employer], [place].  Canceled appt at [facility] and referral now being made to [facility] instead.   Request sent to [facility] in [facility] to change referral.  Dr. Alvy Bimler aware.

## 2013-11-20 NOTE — Telephone Encounter (Signed)
Called Dr. Santiago Bur office and s/w Caryl Pina, pt care coordinator.  Requested referral made to [facility]. Explained that referral needs to come from pt's PCP for her insurance.  Caryl Pina states she will give request to their Referral staff.

## 2013-11-26 ENCOUNTER — Other Ambulatory Visit: Payer: Self-pay | Admitting: *Deleted

## 2013-11-26 NOTE — Telephone Encounter (Signed)
THIS REFILL REQUEST FOR REVLIMID WAS PLACED IN DR.GORSUCH'S ACTIVE WORK FOLDER.

## 2013-12-02 ENCOUNTER — Ambulatory Visit: Payer: No Typology Code available for payment source

## 2013-12-02 ENCOUNTER — Ambulatory Visit (HOSPITAL_BASED_OUTPATIENT_CLINIC_OR_DEPARTMENT_OTHER): Payer: No Typology Code available for payment source

## 2013-12-02 ENCOUNTER — Other Ambulatory Visit (HOSPITAL_BASED_OUTPATIENT_CLINIC_OR_DEPARTMENT_OTHER): Payer: No Typology Code available for payment source

## 2013-12-02 ENCOUNTER — Telehealth: Payer: Self-pay | Admitting: Hematology and Oncology

## 2013-12-02 ENCOUNTER — Ambulatory Visit (HOSPITAL_BASED_OUTPATIENT_CLINIC_OR_DEPARTMENT_OTHER): Payer: No Typology Code available for payment source | Admitting: Hematology and Oncology

## 2013-12-02 ENCOUNTER — Other Ambulatory Visit: Payer: Self-pay

## 2013-12-02 ENCOUNTER — Other Ambulatory Visit: Payer: Self-pay | Admitting: *Deleted

## 2013-12-02 ENCOUNTER — Telehealth: Payer: Self-pay | Admitting: *Deleted

## 2013-12-02 VITALS — BP 137/72 | HR 87 | Temp 98.1°F | Resp 20 | Ht 64.0 in | Wt 178.4 lb

## 2013-12-02 DIAGNOSIS — C9000 Multiple myeloma not having achieved remission: Secondary | ICD-10-CM

## 2013-12-02 DIAGNOSIS — Z95828 Presence of other vascular implants and grafts: Secondary | ICD-10-CM

## 2013-12-02 DIAGNOSIS — D4989 Neoplasm of unspecified behavior of other specified sites: Secondary | ICD-10-CM

## 2013-12-02 DIAGNOSIS — B20 Human immunodeficiency virus [HIV] disease: Secondary | ICD-10-CM

## 2013-12-02 DIAGNOSIS — I10 Essential (primary) hypertension: Secondary | ICD-10-CM

## 2013-12-02 DIAGNOSIS — G609 Hereditary and idiopathic neuropathy, unspecified: Secondary | ICD-10-CM

## 2013-12-02 LAB — CBC WITH DIFFERENTIAL/PLATELET
BASO%: 0.6 % (ref 0.0–2.0)
BASOS ABS: 0 10*3/uL (ref 0.0–0.1)
EOS ABS: 0.1 10*3/uL (ref 0.0–0.5)
EOS%: 3.4 % (ref 0.0–7.0)
HCT: 35.4 % (ref 34.8–46.6)
HEMOGLOBIN: 11.7 g/dL (ref 11.6–15.9)
LYMPH%: 46 % (ref 14.0–49.7)
MCH: 33.3 pg (ref 25.1–34.0)
MCHC: 32.9 g/dL (ref 31.5–36.0)
MCV: 101.4 fL — ABNORMAL HIGH (ref 79.5–101.0)
MONO#: 0.5 10*3/uL (ref 0.1–0.9)
MONO%: 12.4 % (ref 0.0–14.0)
NEUT%: 37.6 % — ABNORMAL LOW (ref 38.4–76.8)
NEUTROS ABS: 1.4 10*3/uL — AB (ref 1.5–6.5)
PLATELETS: 178 10*3/uL (ref 145–400)
RBC: 3.49 10*6/uL — AB (ref 3.70–5.45)
RDW: 14.4 % (ref 11.2–14.5)
WBC: 3.7 10*3/uL — AB (ref 3.9–10.3)
lymph#: 1.7 10*3/uL (ref 0.9–3.3)

## 2013-12-02 LAB — COMPREHENSIVE METABOLIC PANEL (CC13)
ALBUMIN: 3.3 g/dL — AB (ref 3.5–5.0)
ALK PHOS: 88 U/L (ref 40–150)
ALT: 20 U/L (ref 0–55)
AST: 22 U/L (ref 5–34)
Anion Gap: 12 mEq/L — ABNORMAL HIGH (ref 3–11)
BILIRUBIN TOTAL: 0.43 mg/dL (ref 0.20–1.20)
BUN: 11.9 mg/dL (ref 7.0–26.0)
CO2: 24 mEq/L (ref 22–29)
CREATININE: 0.9 mg/dL (ref 0.6–1.1)
Calcium: 8.9 mg/dL (ref 8.4–10.4)
Chloride: 110 mEq/L — ABNORMAL HIGH (ref 98–109)
Glucose: 122 mg/dl (ref 70–140)
POTASSIUM: 3.7 meq/L (ref 3.5–5.1)
Sodium: 147 mEq/L — ABNORMAL HIGH (ref 136–145)
Total Protein: 6 g/dL — ABNORMAL LOW (ref 6.4–8.3)

## 2013-12-02 LAB — PREGNANCY, URINE: Preg Test, Ur: NEGATIVE

## 2013-12-02 MED ORDER — HEPARIN SOD (PORK) LOCK FLUSH 100 UNIT/ML IV SOLN
500.0000 [IU] | Freq: Once | INTRAVENOUS | Status: AC | PRN
  Administered 2013-12-02: 500 [IU]
  Filled 2013-12-02: qty 5

## 2013-12-02 MED ORDER — ZOLEDRONIC ACID 4 MG/100ML IV SOLN
4.0000 mg | Freq: Once | INTRAVENOUS | Status: AC
  Administered 2013-12-02: 4 mg via INTRAVENOUS
  Filled 2013-12-02: qty 100

## 2013-12-02 MED ORDER — SODIUM CHLORIDE 0.9 % IJ SOLN
10.0000 mL | INTRAMUSCULAR | Status: DC | PRN
  Administered 2013-12-02: 10 mL
  Filled 2013-12-02: qty 10

## 2013-12-02 MED ORDER — HEPARIN SOD (PORK) LOCK FLUSH 100 UNIT/ML IV SOLN
500.0000 [IU] | Freq: Once | INTRAVENOUS | Status: AC
  Administered 2013-12-02: 500 [IU] via INTRAVENOUS
  Filled 2013-12-02: qty 5

## 2013-12-02 MED ORDER — SODIUM CHLORIDE 0.9 % IV SOLN
Freq: Once | INTRAVENOUS | Status: AC
  Administered 2013-12-02: 14:00:00 via INTRAVENOUS

## 2013-12-02 MED ORDER — SODIUM CHLORIDE 0.9 % IJ SOLN
10.0000 mL | INTRAMUSCULAR | Status: DC | PRN
  Administered 2013-12-02: 10 mL via INTRAVENOUS
  Filled 2013-12-02: qty 10

## 2013-12-02 NOTE — Patient Instructions (Signed)

## 2013-12-02 NOTE — Progress Notes (Signed)
[place] OFFICE PROGRESS NOTE  Patient Care Team: Philis Fendt, MD as PCP - General (Internal Medicine) Clayborne Dana. Posey Pronto, MD (Nephrology) Heath Lark, MD as Consulting Physician (Hematology and Oncology) Heath Lark, MD as Consulting Physician (Hematology and Oncology) Clayborne Dana. Posey Pronto, MD as Consulting Physician (Nephrology) Truman Hayward, MD as Consulting Physician (Infectious Diseases)  DIAGNOSIS: IgG kappa multiple myeloma  SUMMARY OF ONCOLOGIC HISTORY: Oncology History   Multiple myeloma, IgG kappa with complex cytogenetics   Primary site: Multiple Myeloma   Staging method: AJCC 6th Edition   Clinical: Stage IIIB   Pathologic: Stage IIIB signed by Heath Lark, MD on 12/02/2013  1:39 PM   Summary: Stage IIIB       Multiple myeloma   01/11/2013 Bone Marrow Biopsy Bone marrow aspirate and biopsy show 40% plasma cell involvement with complex cytogenetics. She had M spike over 5 g.   01/25/2013 - 08/23/2013 Chemotherapy The patient was started on Velcade, Revlimid and dexamethasone. The dose of Velcade was reduced from September due to peripheral neuropathy. Subsequently, Velcade was discontinued after November 14 due to peripheral neuropathy.   08/30/2013 -  Chemotherapy The patient goes on maintenance therapy with Revlimid and Zometa. Bone marrow transplant evaluation was delayed due to insurance problems    INTERVAL HISTORY: Jennifer Boyle 48 y.o. female returns for further followup. She is having insurance issue and wants to change primary care provider. She said her persistent peripheral neuropathy but they were not worse. She denies any recent fever, chills, night sweats or abnormal weight loss  I have reviewed the past medical history, past surgical history, social history and family history with the patient and they are unchanged from previous note.  ALLERGIES:  is allergic to aspirin and efavirenz.  MEDICATIONS:  Current Outpatient Prescriptions   Medication Sig Dispense Refill  . Abacavir-Dolutegravir-Lamivud (TRIUMEQ) 600-50-300 MG TABS Take 1 tablet by mouth daily.  30 tablet  11  . acyclovir (ZOVIRAX) 400 MG tablet Take 0.5 tablets (200 mg total) by mouth daily.  60 tablet  3  . aspirin 81 MG tablet Take 81 mg by mouth daily.      . calcium carbonate (OS-CAL) 600 MG TABS tablet Take 600 mg by mouth daily with breakfast.      . cholecalciferol (VITAMIN D) 1000 UNITS tablet Take 1 tablet (1,000 Units total) by mouth daily.  30 tablet  3  . docusate sodium (COLACE) 100 MG capsule Take 100 mg by mouth 2 (two) times daily.      [name] lenalidomide (REVLIMID) 15 MG capsule Take 1 capsule (15 mg total) by mouth daily. Rest 7 days and repeat  21 capsule  0  . lidocaine-prilocaine (EMLA) cream Apply topically as needed. Apply to portacath one hour before chemo access or blood draw.  30 g  3  . polyethylene glycol (MIRALAX / GLYCOLAX) packet Take 17 g by mouth daily.      . prochlorperazine (COMPAZINE) 10 MG tablet Take 1 tablet (10 mg total) by mouth every 6 (six) hours as needed (Nausea or vomiting).  30 tablet  1  . promethazine (PHENERGAN) 25 MG tablet Take 1 tablet (25 mg total) by mouth every 6 (six) hours as needed for nausea.  30 tablet  0  . sodium bicarbonate 650 MG tablet Take 1 tablet (650 mg total) by mouth 3 (three) times daily.  90 tablet  3   No current facility-administered medications for this visit.   Facility-Administered Medications Ordered in  Other Visits  Medication Dose Route Frequency Provider Last Rate Last Dose  . heparin lock flush 100 unit/mL  500 Units Intracatheter Once PRN Heath Lark, MD      . sodium chloride 0.9 % injection 10 mL  10 mL Intracatheter PRN Heath Lark, MD      . Zoledronic Acid (ZOMETA) 4 mg IVPB  4 mg Intravenous Once Heath Lark, MD 400 mL/hr at 12/02/13 1332 4 mg at 12/02/13 1332    REVIEW OF SYSTEMS:   Constitutional: Denies fevers, chills or abnormal weight loss Eyes: Denies blurriness of  vision Ears, nose, mouth, throat, and face: Denies mucositis or sore throat Respiratory: Denies cough, dyspnea or wheezes Cardiovascular: Denies palpitation, chest discomfort or lower extremity swelling Gastrointestinal:  Denies nausea, heartburn or change in bowel habits Skin: Denies abnormal skin rashes Lymphatics: Denies new lymphadenopathy or easy bruising Neurological:Denies numbness, tingling or new weaknesses Behavioral/Psych: Mood is stable, no new changes  All other systems were reviewed with the patient and are negative.  PHYSICAL EXAMINATION: ECOG PERFORMANCE STATUS: 0 - Asymptomatic  Filed Vitals:   12/02/13 1304  BP: 137/72  Pulse: 87  Temp: 98.1 F (36.7 C)  Resp: 20   Filed Weights   12/02/13 1304  Weight: 178 lb 6.4 oz (80.922 kg)    GENERAL:alert, no distress and comfortable SKIN: skin color, texture, turgor are normal, no rashes or significant lesions EYES: normal, Conjunctiva are pink and non-injected, sclera clear OROPHARYNX:no exudate, no erythema and lips, buccal mucosa, and tongue normal  NECK: supple, thyroid normal size, non-tender, without nodularity LYMPH:  no palpable lymphadenopathy in the cervical, axillary or inguinal LUNGS: clear to auscultation and percussion with normal breathing effort HEART: regular rate & rhythm and no murmurs and no lower extremity edema ABDOMEN:abdomen soft, non-tender and normal bowel sounds Musculoskeletal:no cyanosis of digits and no clubbing  NEURO: alert & oriented x 3 with fluent speech, no focal motor/sensory deficits  LABORATORY DATA:  I have reviewed the data as listed    Component Value Date/Time   NA 147* 12/02/2013 1220   NA 141 08/16/2013 1504   K 3.7 12/02/2013 1220   K 3.5 08/16/2013 1504   CL 108 08/16/2013 1504   CL 111* 03/29/2013 1351   CO2 24 12/02/2013 1220   CO2 25 08/16/2013 1504   GLUCOSE 122 12/02/2013 1220   GLUCOSE 113* 08/16/2013 1504   GLUCOSE 129* 03/29/2013 1351   BUN 11.9 12/02/2013 1220    BUN 15 08/16/2013 1504   CREATININE 0.9 12/02/2013 1220   CREATININE 1.03 08/16/2013 1504   CREATININE 1.00 05/02/2013 1412   CALCIUM 8.9 12/02/2013 1220   CALCIUM 9.1 08/16/2013 1504   PROT 6.0* 12/02/2013 1220   PROT 6.4 08/16/2013 1504   ALBUMIN 3.3* 12/02/2013 1220   ALBUMIN 3.4* 08/16/2013 1504   AST 22 12/02/2013 1220   AST 21 08/16/2013 1504   ALT 20 12/02/2013 1220   ALT 20 08/16/2013 1504   ALKPHOS 88 12/02/2013 1220   ALKPHOS 99 08/16/2013 1504   BILITOT 0.43 12/02/2013 1220   BILITOT 0.2* 08/16/2013 1504   GFRNONAA 12* 02/04/2013 0850   GFRAA 14* 02/04/2013 0850    No results found for this basename: SPEP,  UPEP,   kappa and lambda light chains    Lab Results  Component Value Date   WBC 3.7* 12/02/2013   NEUTROABS 1.4* 12/02/2013   HGB 11.7 12/02/2013   HCT 35.4 12/02/2013   MCV 101.4* 12/02/2013   PLT 178 12/02/2013  Chemistry      Component Value Date/Time   NA 147* 12/02/2013 1220   NA 141 08/16/2013 1504   K 3.7 12/02/2013 1220   K 3.5 08/16/2013 1504   CL 108 08/16/2013 1504   CL 111* 03/29/2013 1351   CO2 24 12/02/2013 1220   CO2 25 08/16/2013 1504   BUN 11.9 12/02/2013 1220   BUN 15 08/16/2013 1504   CREATININE 0.9 12/02/2013 1220   CREATININE 1.03 08/16/2013 1504   CREATININE 1.00 05/02/2013 1412      Component Value Date/Time   CALCIUM 8.9 12/02/2013 1220   CALCIUM 9.1 08/16/2013 1504   ALKPHOS 88 12/02/2013 1220   ALKPHOS 99 08/16/2013 1504   AST 22 12/02/2013 1220   AST 21 08/16/2013 1504   ALT 20 12/02/2013 1220   ALT 20 08/16/2013 1504   BILITOT 0.43 12/02/2013 1220   BILITOT 0.2* 08/16/2013 1504     ASSESSMENT & PLAN:  #1 multiple myeloma, in VGPR The patient has received induction chemotherapy with Velcade, dexamethasone and Revlimid. She has achieved very good partial response. There is a delay of referral of the patient the bone marrow transplant service due to insurance issue. According to the patient, she has insurance approval recently to get her medical  insurance.  In the meantime we'll continue on Revlimid only, 15 mg by mouth for 3 weeks, off 1 week. I plan to see her back in about a month. We will repeat blood work then. I will also continue maintenance Zometa once a month.  #2 HIV infection This is not active. She will continue her antiretroviral therapy. #3 hypertension The patient is asymptomatic. We will observe. #4 antimicrobial prophylaxis She will continue acyclovir for minimum of 6 months after discontinuation of Velcade #5 peripheral neuropathy She had peripheral neuropathy likely HIV associated before chemotherapy. While she was on Velcade, and it got worse. At present time I will observe only. #6 DVT prophylaxis I recommend she continue on aspirin  Orders Placed This Encounter  Procedures  . Kappa/lambda light chains    Standing Status: Future     Number of Occurrences:      Standing Expiration Date: 12/02/2014  . SPEP & IFE with QIG    Standing Status: Future     Number of Occurrences:      Standing Expiration Date: 12/02/2014   All questions were answered. The patient knows to call the clinic with any problems, questions or concerns. No barriers to learning was detected. I spent 25 minutes counseling the patient face to face. The total time spent in the appointment was 40 minutes and more than 50% was on counseling and review of test results     [facility], [place], MD 12/02/2013 1:45 PM

## 2013-12-02 NOTE — Telephone Encounter (Signed)
Gave pt appt for lab and MD for [facility] 2015 em,ailed Michelle regarding Zometa

## 2013-12-02 NOTE — Patient Instructions (Signed)

## 2013-12-02 NOTE — Telephone Encounter (Signed)
Per staff message and POF I have scheduled appts.  JMW  

## 2013-12-03 ENCOUNTER — Telehealth: Payer: Self-pay | Admitting: Hematology and Oncology

## 2013-12-03 MED ORDER — LENALIDOMIDE 15 MG PO CAPS: 15.0000 mg | ORAL_CAPSULE | Freq: Every day | ORAL | Status: DC

## 2013-12-03 NOTE — Telephone Encounter (Signed)
Talked to pt and she is aware of appt on March 2015 lab,md and chemo

## 2013-12-04 LAB — KAPPA/LAMBDA LIGHT CHAINS
KAPPA FREE LGHT CHN: 36.1 mg/dL — AB (ref 0.33–1.94)
Kappa:Lambda Ratio: 17.11 — ABNORMAL HIGH (ref 0.26–1.65)
LAMBDA FREE LGHT CHN: 2.11 mg/dL (ref 0.57–2.63)

## 2013-12-04 LAB — SPEP & IFE WITH QIG
Albumin ELP: 59.4 % (ref 55.8–66.1)
Alpha-1-Globulin: 3.9 % (ref 2.9–4.9)
Alpha-2-Globulin: 11.5 % (ref 7.1–11.8)
BETA 2: 3.4 % (ref 3.2–6.5)
BETA GLOBULIN: 6.3 % (ref 4.7–7.2)
Gamma Globulin: 15.5 % (ref 11.1–18.8)
IGA: 146 mg/dL (ref 69–380)
IgG (Immunoglobin G), Serum: 939 mg/dL (ref 690–1700)
IgM, Serum: 18 mg/dL — ABNORMAL LOW (ref 52–322)
M-Spike, %: 0.35 g/dL
TOTAL PROTEIN, SERUM ELECTROPHOR: 5.7 g/dL — AB (ref 6.0–8.3)

## 2013-12-06 NOTE — Telephone Encounter (Signed)
Received confirmation of revlimid shipment on 2/25/215 for delivery next business day from [facility].

## 2013-12-17 ENCOUNTER — Encounter: Payer: Self-pay | Admitting: *Deleted

## 2013-12-17 NOTE — Progress Notes (Signed)
Physician Information form from the [employer] pay assistance Program completed and signed by Dr. Alvy Bimler.  Faxed to [facility] at fax [phone].

## 2013-12-24 ENCOUNTER — Encounter: Payer: Self-pay | Admitting: Hematology and Oncology

## 2013-12-24 NOTE — Progress Notes (Signed)
[employer], [id number], approved revlimid from 12/22/13-12/23/14.

## 2013-12-24 NOTE — Progress Notes (Signed)
Left patient a message to call back to confirm if she has insurance. The balance showing on her bill will be corrected/adjustment.

## 2013-12-25 ENCOUNTER — Encounter: Payer: Self-pay | Admitting: Hematology and Oncology

## 2013-12-25 NOTE — Progress Notes (Signed)
I called the patient to confirm her coverage with [facility], she said will bring the card when she comes to make sure all is in. I advised the billing in question was found and being taken care of.

## 2013-12-30 ENCOUNTER — Telehealth: Payer: Self-pay | Admitting: Hematology and Oncology

## 2013-12-30 ENCOUNTER — Encounter: Payer: Self-pay | Admitting: Hematology and Oncology

## 2013-12-30 ENCOUNTER — Other Ambulatory Visit (HOSPITAL_BASED_OUTPATIENT_CLINIC_OR_DEPARTMENT_OTHER): Payer: 59

## 2013-12-30 ENCOUNTER — Ambulatory Visit (HOSPITAL_BASED_OUTPATIENT_CLINIC_OR_DEPARTMENT_OTHER): Payer: 59 | Admitting: Hematology and Oncology

## 2013-12-30 ENCOUNTER — Ambulatory Visit: Payer: 59

## 2013-12-30 ENCOUNTER — Ambulatory Visit (HOSPITAL_BASED_OUTPATIENT_CLINIC_OR_DEPARTMENT_OTHER): Payer: 59

## 2013-12-30 VITALS — BP 143/83 | HR 82 | Temp 98.2°F | Resp 18 | Ht 64.0 in | Wt 175.0 lb

## 2013-12-30 DIAGNOSIS — B20 Human immunodeficiency virus [HIV] disease: Secondary | ICD-10-CM

## 2013-12-30 DIAGNOSIS — G609 Hereditary and idiopathic neuropathy, unspecified: Secondary | ICD-10-CM

## 2013-12-30 DIAGNOSIS — Z95828 Presence of other vascular implants and grafts: Secondary | ICD-10-CM

## 2013-12-30 DIAGNOSIS — C9000 Multiple myeloma not having achieved remission: Secondary | ICD-10-CM

## 2013-12-30 DIAGNOSIS — I10 Essential (primary) hypertension: Secondary | ICD-10-CM

## 2013-12-30 LAB — COMPREHENSIVE METABOLIC PANEL (CC13)
ALT: 23 U/L (ref 0–55)
AST: 17 U/L (ref 5–34)
Albumin: 3.5 g/dL (ref 3.5–5.0)
Alkaline Phosphatase: 132 U/L (ref 40–150)
Anion Gap: 11 mEq/L (ref 3–11)
BUN: 21.4 mg/dL (ref 7.0–26.0)
CALCIUM: 9.4 mg/dL (ref 8.4–10.4)
CHLORIDE: 110 meq/L — AB (ref 98–109)
CO2: 22 mEq/L (ref 22–29)
Creatinine: 1.2 mg/dL — ABNORMAL HIGH (ref 0.6–1.1)
Glucose: 116 mg/dl (ref 70–140)
POTASSIUM: 3.7 meq/L (ref 3.5–5.1)
Sodium: 143 mEq/L (ref 136–145)
Total Bilirubin: 0.32 mg/dL (ref 0.20–1.20)
Total Protein: 7 g/dL (ref 6.4–8.3)

## 2013-12-30 LAB — CBC WITH DIFFERENTIAL/PLATELET
BASO%: 0.5 % (ref 0.0–2.0)
Basophils Absolute: 0 10*3/uL (ref 0.0–0.1)
EOS%: 3 % (ref 0.0–7.0)
Eosinophils Absolute: 0.1 10*3/uL (ref 0.0–0.5)
HCT: 39.3 % (ref 34.8–46.6)
HGB: 13 g/dL (ref 11.6–15.9)
LYMPH%: 56.2 % — ABNORMAL HIGH (ref 14.0–49.7)
MCH: 32.7 pg (ref 25.1–34.0)
MCHC: 33.1 g/dL (ref 31.5–36.0)
MCV: 98.9 fL (ref 79.5–101.0)
MONO#: 0.4 10*3/uL (ref 0.1–0.9)
MONO%: 8 % (ref 0.0–14.0)
NEUT#: 1.5 10*3/uL (ref 1.5–6.5)
NEUT%: 32.3 % — ABNORMAL LOW (ref 38.4–76.8)
Platelets: 323 10*3/uL (ref 145–400)
RBC: 3.97 10*6/uL (ref 3.70–5.45)
RDW: 14.8 % — AB (ref 11.2–14.5)
WBC: 4.6 10*3/uL (ref 3.9–10.3)
lymph#: 2.6 10*3/uL (ref 0.9–3.3)

## 2013-12-30 LAB — PREGNANCY, URINE: Preg Test, Ur: NEGATIVE

## 2013-12-30 MED ORDER — HEPARIN SOD (PORK) LOCK FLUSH 100 UNIT/ML IV SOLN
500.0000 [IU] | Freq: Once | INTRAVENOUS | Status: AC
  Administered 2013-12-30: 500 [IU] via INTRAVENOUS
  Filled 2013-12-30: qty 5

## 2013-12-30 MED ORDER — HEPARIN SOD (PORK) LOCK FLUSH 100 UNIT/ML IV SOLN
500.0000 [IU] | Freq: Once | INTRAVENOUS | Status: AC | PRN
  Administered 2013-12-30: 500 [IU]
  Filled 2013-12-30: qty 5

## 2013-12-30 MED ORDER — SODIUM CHLORIDE 0.9 % IJ SOLN
10.0000 mL | INTRAMUSCULAR | Status: DC | PRN
  Administered 2013-12-30: 10 mL
  Filled 2013-12-30: qty 10

## 2013-12-30 MED ORDER — ZOLEDRONIC ACID 4 MG/100ML IV SOLN
4.0000 mg | Freq: Once | INTRAVENOUS | Status: AC
  Administered 2013-12-30: 4 mg via INTRAVENOUS
  Filled 2013-12-30: qty 100

## 2013-12-30 MED ORDER — SODIUM CHLORIDE 0.9 % IJ SOLN
10.0000 mL | INTRAMUSCULAR | Status: DC | PRN
  Administered 2013-12-30: 10 mL via INTRAVENOUS
  Filled 2013-12-30: qty 10

## 2013-12-30 NOTE — Patient Instructions (Signed)

## 2013-12-30 NOTE — Patient Instructions (Signed)

## 2013-12-30 NOTE — Progress Notes (Signed)
[name] OFFICE PROGRESS NOTE  Patient Care Team: Dorrene German, MD as PCP - General (Internal Medicine) Hartley Barefoot. Allena Katz, MD (Nephrology) Artis Delay, MD as Consulting Physician (Hematology and Oncology) Artis Delay, MD as Consulting Physician (Hematology and Oncology) Hartley Barefoot. Allena Katz, MD as Consulting Physician (Nephrology) Randall Hiss, MD as Consulting Physician (Infectious Diseases)  DIAGNOSIS: IgG kappa multiple myeloma, ongoing treatment with Revlimid and Zometa  SUMMARY OF ONCOLOGIC HISTORY: Oncology History   Multiple myeloma, IgG kappa with complex cytogenetics   Primary site: Multiple Myeloma   Staging method: AJCC 6th Edition   Clinical: Stage IIIB   Pathologic: Stage IIIB signed by Artis Delay, MD on 12/02/2013  1:39 PM   Summary: Stage IIIB       Multiple myeloma   01/11/2013 Bone Marrow Biopsy Bone marrow aspirate and biopsy show 40% plasma cell involvement with complex cytogenetics. She had M spike over 5 g.   01/25/2013 - 08/23/2013 Chemotherapy The patient was started on Velcade, Revlimid and dexamethasone. The dose of Velcade was reduced from September due to peripheral neuropathy. Subsequently, Velcade was discontinued after November 14 due to peripheral neuropathy.   08/30/2013 -  Chemotherapy The patient goes on maintenance therapy with Revlimid and Zometa. Bone marrow transplant evaluation was delayed due to insurance problems    INTERVAL HISTORY: Jennifer Boyle 48 y.o. female returns for further followup. She is still waiting for insurance approval and referral for primary care provider to see a transplant physician. She still had persistent peripheral neuropathy but they were not worse. She denies any bone pain. No new infection.  I have reviewed the past medical history, past surgical history, social history and family history with the patient and they are unchanged from previous note.  ALLERGIES:  is allergic to aspirin and  efavirenz.  MEDICATIONS:  Current Outpatient Prescriptions  Medication Sig Dispense Refill  . Abacavir-Dolutegravir-Lamivud (TRIUMEQ) 600-50-300 MG TABS Take 1 tablet by mouth daily.  30 tablet  11  . acyclovir (ZOVIRAX) 400 MG tablet Take 0.5 tablets (200 mg total) by mouth daily.  60 tablet  3  . aspirin 81 MG tablet Take 81 mg by mouth daily.      . calcium carbonate (OS-CAL) 600 MG TABS tablet Take 600 mg by mouth daily with breakfast.      . cholecalciferol (VITAMIN D) 1000 UNITS tablet Take 1 tablet (1,000 Units total) by mouth daily.  30 tablet  3  . docusate sodium (COLACE) 100 MG capsule Take 100 mg by mouth 2 (two) times daily.      [name] lenalidomide (REVLIMID) 15 MG capsule Take 1 capsule (15 mg total) by mouth daily. Rest 7 days and repeat  21 capsule  0  . lidocaine-prilocaine (EMLA) cream Apply topically as needed. Apply to portacath one hour before chemo access or blood draw.  30 g  3  . polyethylene glycol (MIRALAX / GLYCOLAX) packet Take 17 g by mouth daily.      . prochlorperazine (COMPAZINE) 10 MG tablet Take 1 tablet (10 mg total) by mouth every 6 (six) hours as needed (Nausea or vomiting).  30 tablet  1  . promethazine (PHENERGAN) 25 MG tablet Take 1 tablet (25 mg total) by mouth every 6 (six) hours as needed for nausea.  30 tablet  0  . sodium bicarbonate 650 MG tablet Take 1 tablet (650 mg total) by mouth 3 (three) times daily.  90 tablet  3   No current facility-administered medications for  this visit.    REVIEW OF SYSTEMS:   Constitutional: Denies fevers, chills or abnormal weight loss Eyes: Denies blurriness of vision Ears, nose, mouth, throat, and face: Denies mucositis or sore throat Respiratory: Denies cough, dyspnea or wheezes Cardiovascular: Denies palpitation, chest discomfort or lower extremity swelling Gastrointestinal:  Denies nausea, heartburn or change in bowel habits Skin: Denies abnormal skin rashes Lymphatics: Denies new lymphadenopathy or easy  bruising Behavioral/Psych: Mood is stable, no new changes  All other systems were reviewed with the patient and are negative.  PHYSICAL EXAMINATION: ECOG PERFORMANCE STATUS: 0 - Asymptomatic  Filed Vitals:   12/30/13 1301  BP: 143/83  Pulse: 82  Temp: 98.2 F (36.8 C)  Resp: 18   Filed Weights   12/30/13 1301  Weight: 175 lb (79.379 kg)    GENERAL:alert, no distress and comfortable SKIN: skin color, texture, turgor are normal, no rashes or significant lesions EYES: normal, Conjunctiva are pink and non-injected, sclera clear OROPHARYNX:no exudate, no erythema and lips, buccal mucosa, and tongue normal  NECK: supple, thyroid normal size, non-tender, without nodularity LYMPH:  no palpable lymphadenopathy in the cervical, axillary or inguinal LUNGS: clear to auscultation and percussion with normal breathing effort HEART: regular rate & rhythm and no murmurs and no lower extremity edema ABDOMEN:abdomen soft, non-tender and normal bowel sounds Musculoskeletal:no cyanosis of digits and no clubbing  NEURO: alert & oriented x 3 with fluent speech, no focal motor/sensory deficits  LABORATORY DATA:  I have reviewed the data as listed    Component Value Date/Time   NA 147* 12/02/2013 1220   NA 141 08/16/2013 1504   K 3.7 12/02/2013 1220   K 3.5 08/16/2013 1504   CL 108 08/16/2013 1504   CL 111* 03/29/2013 1351   CO2 24 12/02/2013 1220   CO2 25 08/16/2013 1504   GLUCOSE 122 12/02/2013 1220   GLUCOSE 113* 08/16/2013 1504   GLUCOSE 129* 03/29/2013 1351   BUN 11.9 12/02/2013 1220   BUN 15 08/16/2013 1504   CREATININE 0.9 12/02/2013 1220   CREATININE 1.03 08/16/2013 1504   CREATININE 1.00 05/02/2013 1412   CALCIUM 8.9 12/02/2013 1220   CALCIUM 9.1 08/16/2013 1504   PROT 6.0* 12/02/2013 1220   PROT 6.4 08/16/2013 1504   ALBUMIN 3.3* 12/02/2013 1220   ALBUMIN 3.4* 08/16/2013 1504   AST 22 12/02/2013 1220   AST 21 08/16/2013 1504   ALT 20 12/02/2013 1220   ALT 20 08/16/2013 1504   ALKPHOS 88  12/02/2013 1220   ALKPHOS 99 08/16/2013 1504   BILITOT 0.43 12/02/2013 1220   BILITOT 0.2* 08/16/2013 1504   GFRNONAA 12* 02/04/2013 0850   GFRAA 14* 02/04/2013 0850    No results found for this basename: SPEP,  UPEP,   kappa and lambda light chains    Lab Results  Component Value Date   WBC 4.6 12/30/2013   NEUTROABS 1.5 12/30/2013   HGB 13.0 12/30/2013   HCT 39.3 12/30/2013   MCV 98.9 12/30/2013   PLT 323 12/30/2013      Chemistry      Component Value Date/Time   NA 147* 12/02/2013 1220   NA 141 08/16/2013 1504   K 3.7 12/02/2013 1220   K 3.5 08/16/2013 1504   CL 108 08/16/2013 1504   CL 111* 03/29/2013 1351   CO2 24 12/02/2013 1220   CO2 25 08/16/2013 1504   BUN 11.9 12/02/2013 1220   BUN 15 08/16/2013 1504   CREATININE 0.9 12/02/2013 1220   CREATININE 1.03 08/16/2013  1504   CREATININE 1.00 05/02/2013 1412      Component Value Date/Time   CALCIUM 8.9 12/02/2013 1220   CALCIUM 9.1 08/16/2013 1504   ALKPHOS 88 12/02/2013 1220   ALKPHOS 99 08/16/2013 1504   AST 22 12/02/2013 1220   AST 21 08/16/2013 1504   ALT 20 12/02/2013 1220   ALT 20 08/16/2013 1504   BILITOT 0.43 12/02/2013 1220   BILITOT 0.2* 08/16/2013 1504      ASSESSMENT & PLAN:  #1 multiple myeloma, in VGPR The patient has received induction chemotherapy with Velcade, dexamethasone and Revlimid. She has achieved very good partial response. There is a delay of referral of the patient the bone marrow transplant service due to insurance issue. According to the patient, she has insurance approval recently to get her medical insurance.  In the meantime we'll continue on Revlimid only, 15 mg by mouth for 3 weeks, off 1 week. I plan to see her back in about a month. We will repeat blood work then. I will also continue maintenance Zometa once a month.  #2 HIV infection This is not active. She will continue her antiretroviral therapy. #3 hypertension The patient is asymptomatic. We will observe. #4 antimicrobial prophylaxis She will  continue acyclovir for minimum of 6 months after discontinuation of Velcade #5 peripheral neuropathy She had peripheral neuropathy likely HIV associated before chemotherapy. While she was on Velcade, and it got worse. At present time I will observe only. #6 DVT prophylaxis I recommend she continue on aspirin  All questions were answered. The patient knows to call the clinic with any problems, questions or concerns. No barriers to learning was detected. I spent 25 minutes counseling the patient face to face. The total time spent in the appointment was 40 minutes and more than 50% was on counseling and review of test results     [facility], [place], MD 12/30/2013 1:19 PM

## 2013-12-30 NOTE — Telephone Encounter (Signed)
[facility] AD[facility] PRINTED APPT SCHED AND AVS FORPT FOR aPRIL    °

## 2013-12-31 ENCOUNTER — Other Ambulatory Visit: Payer: Self-pay | Admitting: *Deleted

## 2013-12-31 DIAGNOSIS — D4989 Neoplasm of unspecified behavior of other specified sites: Secondary | ICD-10-CM

## 2013-12-31 MED ORDER — LENALIDOMIDE 15 MG PO CAPS: 15.0000 mg | ORAL_CAPSULE | Freq: Every day | ORAL | Status: DC

## 2014-01-01 ENCOUNTER — Encounter: Payer: Self-pay | Admitting: Hematology and Oncology

## 2014-01-01 LAB — SPEP & IFE WITH QIG
ALBUMIN ELP: 56.2 % (ref 55.8–66.1)
ALPHA-1-GLOBULIN: 4.2 % (ref 2.9–4.9)
Alpha-2-Globulin: 11.8 % (ref 7.1–11.8)
BETA 2: 5 % (ref 3.2–6.5)
BETA GLOBULIN: 6.2 % (ref 4.7–7.2)
Gamma Globulin: 16.6 % (ref 11.1–18.8)
IgA: 167 mg/dL (ref 69–380)
IgG (Immunoglobin G), Serum: 1200 mg/dL (ref 690–1700)
IgM, Serum: 18 mg/dL — ABNORMAL LOW (ref 52–322)
M-Spike, %: 0.48 g/dL
Total Protein, Serum Electrophoresis: 6.5 g/dL (ref 6.0–8.3)

## 2014-01-01 LAB — KAPPA/LAMBDA LIGHT CHAINS
KAPPA FREE LGHT CHN: 57.4 mg/dL — AB (ref 0.33–1.94)
Kappa:Lambda Ratio: 29.44 — ABNORMAL HIGH (ref 0.26–1.65)
LAMBDA FREE LGHT CHN: 1.95 mg/dL (ref 0.57–2.63)

## 2014-01-01 NOTE — Progress Notes (Signed)
Per [employer] patient is approved for Revlimid asst.

## 2014-01-02 NOTE — Telephone Encounter (Signed)
RECEIVED TWO FAXES FROM [employer] CONCERNING A FACSIMILE RECEIPT AND A PRESCRIPTION SHIPMENT FOR REVLIMID ON 01/01/14.

## 2014-01-08 ENCOUNTER — Encounter: Payer: Self-pay | Admitting: *Deleted

## 2014-01-15 ENCOUNTER — Telehealth: Payer: Self-pay | Admitting: *Deleted

## 2014-01-15 NOTE — Telephone Encounter (Signed)
Needing MD, Lab and PAP smear appts

## 2014-01-21 ENCOUNTER — Encounter: Payer: Self-pay | Admitting: *Deleted

## 2014-01-21 NOTE — Progress Notes (Signed)
Pt got referral to [facility] BMT from her PCP, Dr. Justin Mend.  They sent Dr. Justin Mend a letter requesting pt's medical records.  Pt forwarded letter to this RN and I gave the letter requesting medical records to Artemio Aly in our HIM dept.

## 2014-01-22 ENCOUNTER — Other Ambulatory Visit: Payer: Self-pay | Admitting: *Deleted

## 2014-01-22 ENCOUNTER — Telehealth: Payer: Self-pay | Admitting: Hematology and Oncology

## 2014-01-22 DIAGNOSIS — B20 Human immunodeficiency virus [HIV] disease: Secondary | ICD-10-CM

## 2014-01-22 DIAGNOSIS — C9000 Multiple myeloma not having achieved remission: Secondary | ICD-10-CM

## 2014-01-22 DIAGNOSIS — D4989 Neoplasm of unspecified behavior of other specified sites: Secondary | ICD-10-CM

## 2014-01-22 MED ORDER — SODIUM BICARBONATE 650 MG PO TABS: 650.0000 mg | ORAL_TABLET | Freq: Three times a day (TID) | ORAL | Status: DC

## 2014-01-22 NOTE — Telephone Encounter (Signed)
Faxed medical records to Dr. Ok Edwards @ [facility].  Scans and slides will be fedex.

## 2014-01-23 ENCOUNTER — Other Ambulatory Visit: Payer: Self-pay | Admitting: *Deleted

## 2014-01-23 NOTE — Telephone Encounter (Addendum)
THIS REFILL REQUEST FOR REVLIMID WAS SHOWN TO [email or web address] ORDER AND READ BACK TO [place]- PLACE REFILL FOR REVLIMID ON HOLD UNTIL PT. IS SEEN ON 01/29/14. NOTIFIED [facility].

## 2014-01-29 ENCOUNTER — Other Ambulatory Visit: Payer: Self-pay

## 2014-01-29 ENCOUNTER — Ambulatory Visit: Payer: Self-pay

## 2014-01-29 ENCOUNTER — Ambulatory Visit: Payer: Self-pay | Admitting: Hematology and Oncology

## 2014-01-30 ENCOUNTER — Encounter: Payer: Self-pay | Admitting: Hematology and Oncology

## 2014-01-30 ENCOUNTER — Telehealth: Payer: Self-pay | Admitting: Hematology and Oncology

## 2014-01-30 ENCOUNTER — Encounter: Payer: Self-pay | Admitting: Infectious Disease

## 2014-01-30 ENCOUNTER — Other Ambulatory Visit: Payer: Self-pay | Admitting: Hematology and Oncology

## 2014-01-30 NOTE — Telephone Encounter (Signed)
pt came in to r/s appt..done per MD orders

## 2014-01-31 ENCOUNTER — Telehealth: Payer: Self-pay | Admitting: *Deleted

## 2014-01-31 NOTE — Telephone Encounter (Signed)
Pt instructed to keep appointment at [facility], Dr Alvy Bimler will discuss change in therapy with her at next visit.

## 2014-02-07 ENCOUNTER — Encounter: Payer: Self-pay | Admitting: *Deleted

## 2014-02-07 ENCOUNTER — Telehealth: Payer: Self-pay | Admitting: *Deleted

## 2014-02-07 DIAGNOSIS — C9000 Multiple myeloma not having achieved remission: Secondary | ICD-10-CM | POA: Insufficient documentation

## 2014-02-07 NOTE — Progress Notes (Signed)
Faxed lab results from past 5 months to Dr. Ok Edwards at [facility] fax [phone].

## 2014-02-07 NOTE — Telephone Encounter (Signed)
Left VM for pt to call Nurse on Monday. (Dr. Alvy Bimler s/w Dr. Ok Edwards at [facility].  They are going to schedule BMBX at [facility] and are planning on transplant soon if appropriate.  Dr. Alvy Bimler does not need to see pt here next week as scheduled until after BMBX is done and we can cancel pt's appts.  Need to discuss w/ pt and make sure she understands reason for canceling appts at this time).

## 2014-02-10 ENCOUNTER — Telehealth: Payer: Self-pay | Admitting: *Deleted

## 2014-02-10 NOTE — Telephone Encounter (Signed)
Informed pt we will cancel appts here on Thursday 5/07 since we are still waiting on BMBx results from [facility].  Her next appt at [facility] is on 5/18.  Instructed pt to notify us if she needs to be seen here again after that appt. She verbalized understanding.

## 2014-02-12 ENCOUNTER — Encounter: Payer: Self-pay | Admitting: Licensed Clinical Social Worker

## 2014-02-12 NOTE — Progress Notes (Signed)
CM-Dannica Bickham assigned to CT until 06/2014

## 2014-02-13 ENCOUNTER — Ambulatory Visit: Payer: Self-pay

## 2014-02-13 ENCOUNTER — Other Ambulatory Visit: Payer: Self-pay

## 2014-02-13 ENCOUNTER — Ambulatory Visit: Payer: Self-pay | Admitting: Hematology and Oncology

## 2014-02-14 ENCOUNTER — Other Ambulatory Visit: Payer: Self-pay | Admitting: *Deleted

## 2014-02-14 NOTE — Telephone Encounter (Signed)
Refill request for Revlimid on MD desk. Looks like waiting for bone marrow results and transplant, so may not be indicated at this time.

## 2014-02-26 ENCOUNTER — Telehealth: Payer: Self-pay | Admitting: *Deleted

## 2014-02-26 NOTE — Telephone Encounter (Signed)
Call from Transplant coordinator, Conchita Paris, at [facility].  States they want Dr. Alvy Bimler to start pt on another round of chemotherapy to finish by June 17.  Ph [phone].

## 2014-02-28 ENCOUNTER — Encounter: Payer: Self-pay | Admitting: Hematology and Oncology

## 2014-02-28 ENCOUNTER — Telehealth: Payer: Self-pay | Admitting: *Deleted

## 2014-02-28 ENCOUNTER — Other Ambulatory Visit: Payer: Self-pay | Admitting: *Deleted

## 2014-02-28 ENCOUNTER — Other Ambulatory Visit: Payer: Self-pay | Admitting: Hematology and Oncology

## 2014-02-28 DIAGNOSIS — C9000 Multiple myeloma not having achieved remission: Secondary | ICD-10-CM

## 2014-02-28 NOTE — Telephone Encounter (Signed)
Placed POF. Spoke with transplant MD from [facility], recommended weekly Velcade and Dex

## 2014-02-28 NOTE — Telephone Encounter (Signed)
Informed pt that Dr. Alvy Bimler is not ordering Revlimid until she speaks w/ Dr. Isaias Sakai at [facility].  She is waiting for Dr. Isaias Sakai to call her.   Pt verbalized a lot of frustration w/ not getting revlimid refilled today.  Explained Dr. Alvy Bimler had stopped revlimid because it wasn't working and wants to s/w MD at [facility] before reordering it.  Pt verbalized understanding.

## 2014-03-04 ENCOUNTER — Telehealth: Payer: Self-pay | Admitting: *Deleted

## 2014-03-04 IMAGING — CT CT BIOPSY
4 of 6 series · 8 of 24 positions shown, 11 images · non-contrast
Comparison: none

CLINICAL DATA: HIV, anemia

[Series 3: add scan 5.0 b70f · axial · 0.74mm/px · z∈[-70,-65]mm · 2 of 4 slices shown, 5 images (1 of 4)]
[im 2/4  soft-tissue]
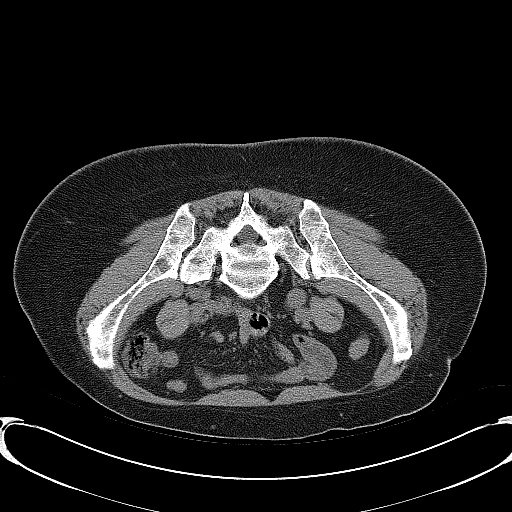
[im 2/4  lung]
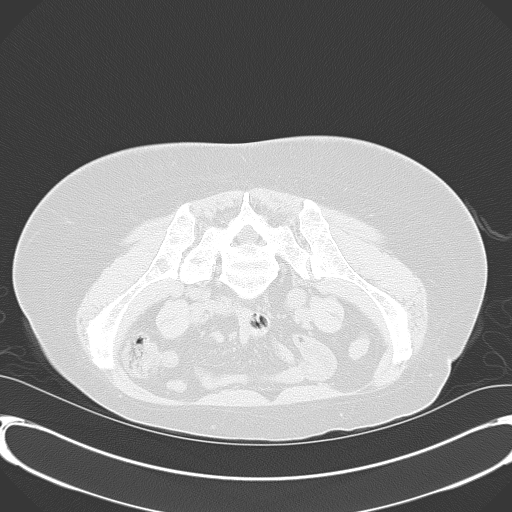
[im 2/4  bone]
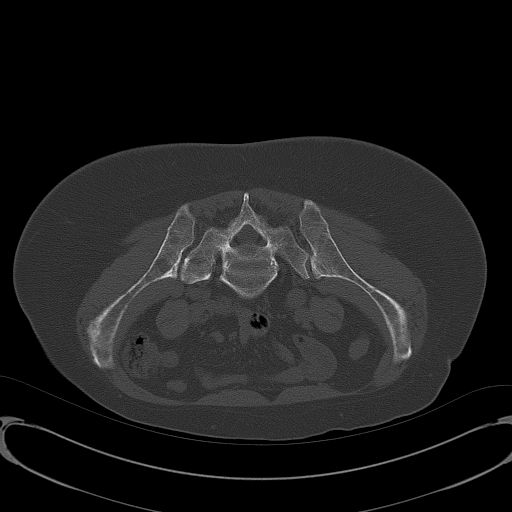
[im 3/4  soft-tissue]
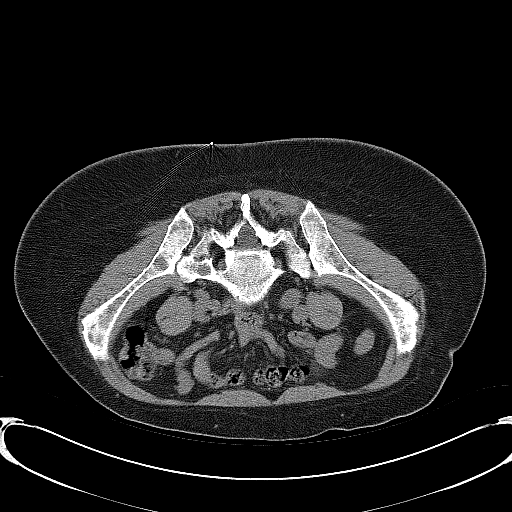
[im 3/4  lung]
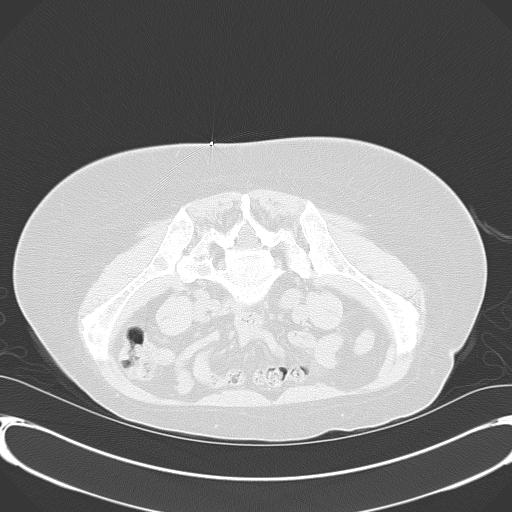

[Series 4: add scan 5.0 b70f · axial · 0.74mm/px · z∈[-77,-72]mm · 2 of 4 slices shown (2 of 4)]
[im 2/4  soft-tissue]
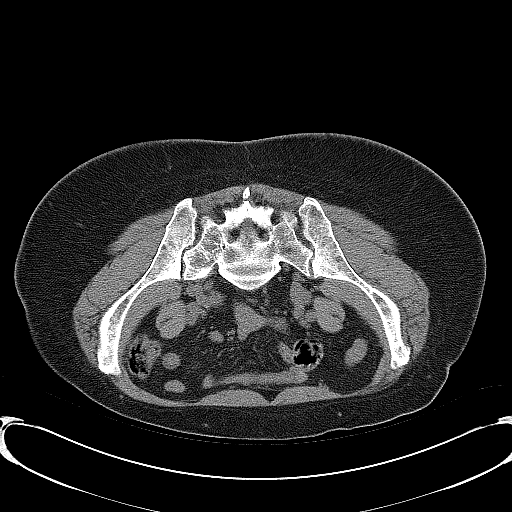
[im 3/4  soft-tissue]
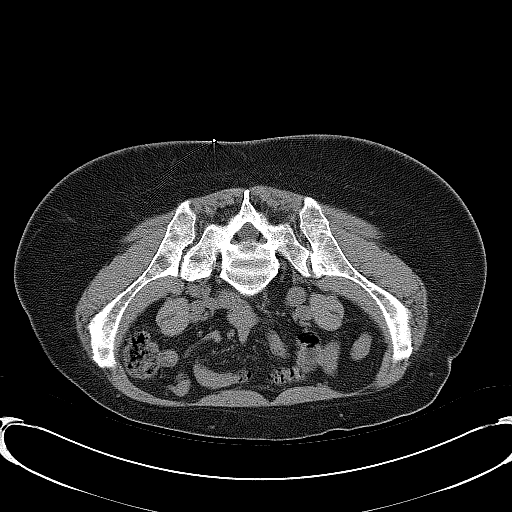

[Series 5: add scan 5.0 b70f · axial · 0.74mm/px · z∈[-77,-72]mm · 2 of 4 slices shown (3 of 4)]
[im 2/4  soft-tissue]
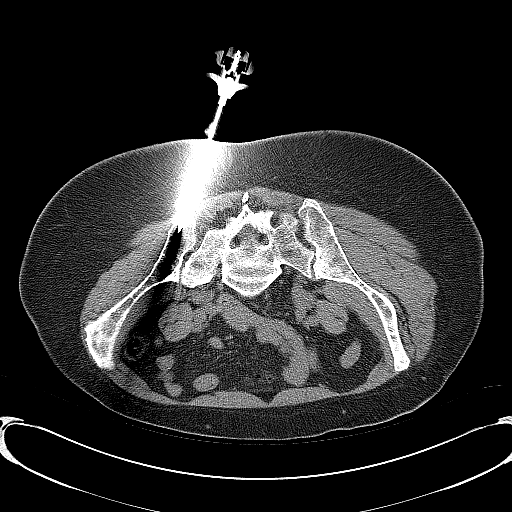
[im 3/4  soft-tissue]
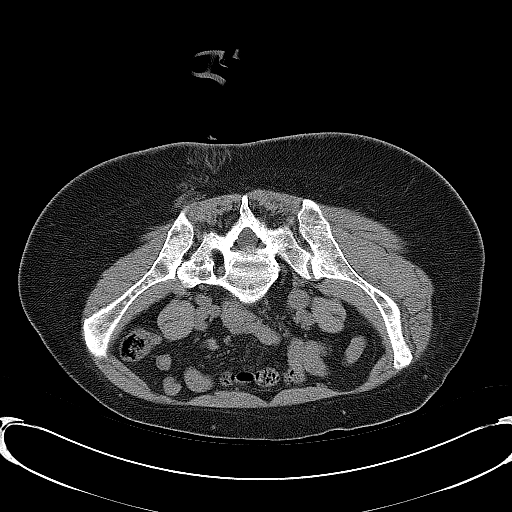

[Series 6: add scan 5.0 b70f · axial · 0.74mm/px · z∈[-77,-72]mm · 2 of 4 slices shown (4 of 4)]
[im 2/4  soft-tissue]
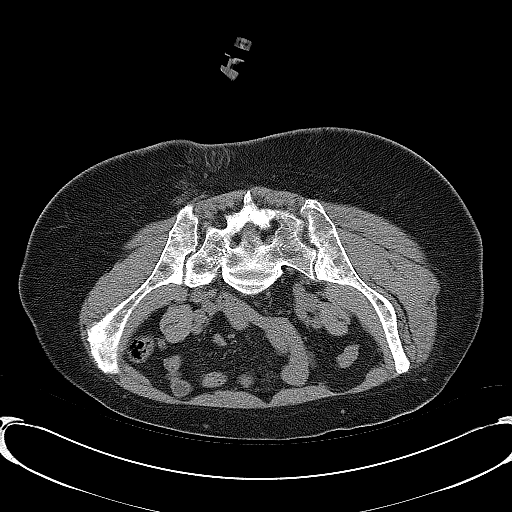
[im 3/4  soft-tissue]
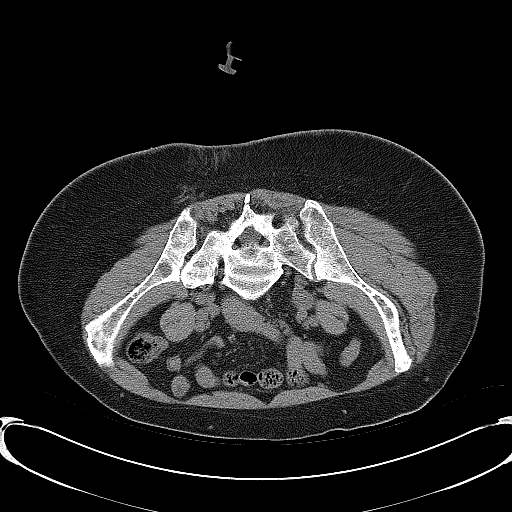

[8 of 24 positions shown; findings below may reference images not displayed]

CT GUIDED RIGHT ILIAC BONE MARROW ASPIRATION AND CORE BIOPSY

Date:  01/11/2013 [DATE]

Radiologist:  Timi Marte, M.D.

Medications:  4 mg Versed, 100 mcg Fentanyl

Guidance:  CT

Sedation time:  14 minutes

Contrast volume:  None.

Complications:  No immediate

PROCEDURE/FINDINGS:

Informed consent was obtained from the patient following
explanation of the procedure, risks, benefits and alternatives.
The patient understands, agrees and consents for the procedure.
All questions were addressed.  A time out was performed.

The patient was positioned prone and noncontrast localization CT
was performed of the pelvis to demonstrate the iliac marrow spaces.

Maximal barrier sterile technique utilized including caps, mask,
sterile gowns, sterile gloves, large sterile drape, hand hygiene,
and betadine prep.

Under sterile conditions and local anesthesia, an 11 gauge coaxial
bone biopsy needle was advanced into the right iliac marrow space.
Needle position was confirmed with CT imaging. Initially, bone
marrow aspiration was performed. Next, the 11 gauge outer cannula
was utilized to obtain a right iliac bone marrow core biopsy.
Needle was removed. Hemostasis was obtained with compression. The
patient tolerated the procedure well. Samples were prepared with
the cytotechnologist. No immediate complications.
IMPRESSION: CT guided right iliac bone marrow aspiration and core biopsy.

## 2014-03-04 NOTE — Telephone Encounter (Signed)
Per staff message and POF I have scheduled appts.  JMW  

## 2014-03-04 NOTE — Telephone Encounter (Signed)
Left VM for pt informing her Dr. Alvy Bimler s/w MD at [facility] and have decided on Velcade weekly.  Informed of appt on Friday 5/29 at 2:30 pm for lab, then MD and Velcade.  Asked her to please call us back if any questions or if she cannot make this appt.

## 2014-03-07 ENCOUNTER — Ambulatory Visit (HOSPITAL_BASED_OUTPATIENT_CLINIC_OR_DEPARTMENT_OTHER): Payer: 59

## 2014-03-07 ENCOUNTER — Encounter: Payer: Self-pay | Admitting: Hematology and Oncology

## 2014-03-07 ENCOUNTER — Other Ambulatory Visit: Payer: Self-pay | Admitting: Hematology and Oncology

## 2014-03-07 ENCOUNTER — Other Ambulatory Visit (HOSPITAL_BASED_OUTPATIENT_CLINIC_OR_DEPARTMENT_OTHER): Payer: 59

## 2014-03-07 ENCOUNTER — Telehealth: Payer: Self-pay | Admitting: Hematology and Oncology

## 2014-03-07 ENCOUNTER — Ambulatory Visit: Payer: 59

## 2014-03-07 ENCOUNTER — Ambulatory Visit (HOSPITAL_BASED_OUTPATIENT_CLINIC_OR_DEPARTMENT_OTHER): Payer: 59 | Admitting: Hematology and Oncology

## 2014-03-07 DIAGNOSIS — I10 Essential (primary) hypertension: Secondary | ICD-10-CM

## 2014-03-07 DIAGNOSIS — C9000 Multiple myeloma not having achieved remission: Secondary | ICD-10-CM

## 2014-03-07 DIAGNOSIS — G609 Hereditary and idiopathic neuropathy, unspecified: Secondary | ICD-10-CM

## 2014-03-07 DIAGNOSIS — B20 Human immunodeficiency virus [HIV] disease: Secondary | ICD-10-CM

## 2014-03-07 DIAGNOSIS — Z95828 Presence of other vascular implants and grafts: Secondary | ICD-10-CM

## 2014-03-07 DIAGNOSIS — Z5112 Encounter for antineoplastic immunotherapy: Secondary | ICD-10-CM

## 2014-03-07 LAB — CBC WITH DIFFERENTIAL/PLATELET
BASO%: 0.3 % (ref 0.0–2.0)
Basophils Absolute: 0 10*3/uL (ref 0.0–0.1)
EOS ABS: 0.1 10*3/uL (ref 0.0–0.5)
EOS%: 1.8 % (ref 0.0–7.0)
HCT: 36.2 % (ref 34.8–46.6)
HGB: 11.9 g/dL (ref 11.6–15.9)
LYMPH%: 44.6 % (ref 14.0–49.7)
MCH: 33.3 pg (ref 25.1–34.0)
MCHC: 32.9 g/dL (ref 31.5–36.0)
MCV: 101.2 fL — AB (ref 79.5–101.0)
MONO#: 0.4 10*3/uL (ref 0.1–0.9)
MONO%: 9 % (ref 0.0–14.0)
NEUT#: 2.1 10*3/uL (ref 1.5–6.5)
NEUT%: 44.3 % (ref 38.4–76.8)
PLATELETS: 191 10*3/uL (ref 145–400)
RBC: 3.57 10*6/uL — ABNORMAL LOW (ref 3.70–5.45)
RDW: 15.2 % — ABNORMAL HIGH (ref 11.2–14.5)
WBC: 4.7 10*3/uL (ref 3.9–10.3)
lymph#: 2.1 10*3/uL (ref 0.9–3.3)

## 2014-03-07 LAB — COMPREHENSIVE METABOLIC PANEL
ALBUMIN: 3.3 g/dL — AB (ref 3.5–5.2)
ALK PHOS: 91 U/L (ref 39–117)
ALT: 16 U/L (ref 0–35)
AST: 22 U/L (ref 0–37)
BILIRUBIN TOTAL: 0.2 mg/dL — AB (ref 0.3–1.2)
BUN: 19 mg/dL (ref 6–23)
CO2: 22 mEq/L (ref 19–32)
Calcium: 9.2 mg/dL (ref 8.4–10.5)
Chloride: 106 mEq/L (ref 96–112)
Creatinine, Ser: 1.02 mg/dL (ref 0.50–1.10)
GLUCOSE: 121 mg/dL — AB (ref 70–99)
Potassium: 3.9 mEq/L (ref 3.5–5.3)
SODIUM: 141 meq/L (ref 135–145)
TOTAL PROTEIN: 6.8 g/dL (ref 6.0–8.3)

## 2014-03-07 MED ORDER — SODIUM CHLORIDE 0.9 % IJ SOLN
10.0000 mL | Freq: Once | INTRAMUSCULAR | Status: AC
  Administered 2014-03-07: 10 mL
  Filled 2014-03-07: qty 10

## 2014-03-07 MED ORDER — HEPARIN SOD (PORK) LOCK FLUSH 100 UNIT/ML IV SOLN
500.0000 [IU] | Freq: Once | INTRAVENOUS | Status: AC
  Administered 2014-03-07: 500 [IU]
  Filled 2014-03-07: qty 5

## 2014-03-07 MED ORDER — ZOLEDRONIC ACID 4 MG/100ML IV SOLN
4.0000 mg | Freq: Once | INTRAVENOUS | Status: AC
  Administered 2014-03-07: 4 mg via INTRAVENOUS
  Filled 2014-03-07: qty 100

## 2014-03-07 MED ORDER — ONDANSETRON HCL 8 MG PO TABS
8.0000 mg | ORAL_TABLET | Freq: Once | ORAL | Status: AC
  Administered 2014-03-07: 8 mg via ORAL

## 2014-03-07 MED ORDER — BORTEZOMIB CHEMO SQ INJECTION 3.5 MG (2.5MG/ML)
1.0000 mg/m2 | Freq: Once | INTRAMUSCULAR | Status: AC
  Administered 2014-03-07: 1.75 mg via SUBCUTANEOUS
  Filled 2014-03-07: qty 1.75

## 2014-03-07 MED ORDER — ONDANSETRON HCL 8 MG PO TABS
ORAL_TABLET | ORAL | Status: AC
  Filled 2014-03-07: qty 1

## 2014-03-07 MED ORDER — SODIUM CHLORIDE 0.9 % IV SOLN
Freq: Once | INTRAVENOUS | Status: AC
  Administered 2014-03-07: 17:00:00 via INTRAVENOUS

## 2014-03-07 MED ORDER — SODIUM CHLORIDE 0.9 % IJ SOLN
10.0000 mL | INTRAMUSCULAR | Status: DC | PRN
  Administered 2014-03-07: 10 mL via INTRAVENOUS
  Filled 2014-03-07: qty 10

## 2014-03-07 NOTE — Patient Instructions (Signed)

## 2014-03-07 NOTE — Telephone Encounter (Signed)
gv ad printed appts ched and avs for pt for June...sed added tx.

## 2014-03-07 NOTE — Patient Instructions (Signed)
[facility] Discharge Instructions for Patients Receiving Chemotherapy  Today you received the following chemotherapy agents :  Velcade, Zometa.  To help prevent nausea and vomiting after your treatment, we encourage you to take your nausea medication as prescribed.   If you develop nausea and vomiting that is not controlled by your nausea medication, call the clinic.   BELOW ARE SYMPTOMS THAT SHOULD BE REPORTED IMMEDIATELY:  *FEVER GREATER THAN 100.5 F  *CHILLS WITH OR WITHOUT FEVER  NAUSEA AND VOMITING THAT IS NOT CONTROLLED WITH YOUR NAUSEA MEDICATION  *UNUSUAL SHORTNESS OF BREATH  *UNUSUAL BRUISING OR BLEEDING  TENDERNESS IN MOUTH AND THROAT WITH OR WITHOUT PRESENCE OF ULCERS  *URINARY PROBLEMS  *BOWEL PROBLEMS  UNUSUAL RASH Items with * indicate a potential emergency and should be followed up as soon as possible.  Feel free to call the clinic you have any questions or concerns. The clinic phone number is [phone].

## 2014-03-09 NOTE — Progress Notes (Signed)
[place] OFFICE PROGRESS NOTE  Patient Care Team: Philis Fendt, MD as PCP - General (Internal Medicine) Clayborne Dana. Posey Pronto, MD (Nephrology) Heath Lark, MD as Consulting Physician (Hematology and Oncology) Heath Lark, MD as Consulting Physician (Hematology and Oncology) Clayborne Dana. Posey Pronto, MD as Consulting Physician (Nephrology) Truman Hayward, MD as Consulting Physician (Infectious Diseases)  SUMMARY OF ONCOLOGIC HISTORY: Oncology History   Multiple myeloma, IgG kappa with complex cytogenetics   Primary site: Multiple Myeloma   Staging method: AJCC 6th Edition   Clinical: Stage IIIB   Pathologic: Stage IIIB signed by Heath Lark, MD on 12/02/2013  1:39 PM   Summary: Stage IIIB       Multiple myeloma   01/11/2013 Bone Marrow Biopsy Bone marrow aspirate and biopsy show 40% plasma cell involvement with complex cytogenetics. She had M spike over 5 g.   01/25/2013 - 08/23/2013 Chemotherapy The patient was started on Velcade, Revlimid and dexamethasone. The dose of Velcade was reduced from September due to peripheral neuropathy. Subsequently, Velcade was discontinued after November 14 due to peripheral neuropathy.   08/30/2013 -  Chemotherapy The patient goes on maintenance therapy with Revlimid and Zometa. Bone marrow transplant evaluation was delayed due to insurance problems    INTERVAL HISTORY: Please see below for problem oriented charting. Majority of the evaluation today was spent counseling the patient. Overall, she has no new symptoms.  REVIEW OF SYSTEMS:   Constitutional: Denies fevers, chills or abnormal weight loss Eyes: Denies blurriness of vision Ears, nose, mouth, throat, and face: Denies mucositis or sore throat Respiratory: Denies cough, dyspnea or wheezes Cardiovascular: Denies palpitation, chest discomfort or lower extremity swelling Gastrointestinal:  Denies nausea, heartburn or change in bowel habits Skin: Denies abnormal skin rashes Lymphatics: Denies  new lymphadenopathy or easy bruising Neurological:Denies numbness, tingling or new weaknesses Behavioral/Psych: Mood is stable, no new changes  All other systems were reviewed with the patient and are negative.  I have reviewed the past medical history, past surgical history, social history and family history with the patient and they are unchanged from previous note.  ALLERGIES:  is allergic to aspirin and efavirenz.  MEDICATIONS:  Current Outpatient Prescriptions  Medication Sig Dispense Refill  . Abacavir-Dolutegravir-Lamivud (TRIUMEQ) 600-50-300 MG TABS Take 1 tablet by mouth daily.  30 tablet  11  . acyclovir (ZOVIRAX) 400 MG tablet Take 0.5 tablets (200 mg total) by mouth daily.  60 tablet  3  . aspirin 81 MG tablet Take 81 mg by mouth daily.      . calcium carbonate (OS-CAL) 600 MG TABS tablet Take 600 mg by mouth daily with breakfast.      . cholecalciferol (VITAMIN D) 1000 UNITS tablet Take 1 tablet (1,000 Units total) by mouth daily.  30 tablet  3  . docusate sodium (COLACE) 100 MG capsule Take 100 mg by mouth 2 (two) times daily.      [name] lidocaine-prilocaine (EMLA) cream Apply topically as needed. Apply to portacath one hour before chemo access or blood draw.  30 g  3  . polyethylene glycol (MIRALAX / GLYCOLAX) packet Take 17 g by mouth daily.      . prochlorperazine (COMPAZINE) 10 MG tablet Take 1 tablet (10 mg total) by mouth every 6 (six) hours as needed (Nausea or vomiting).  30 tablet  1  . promethazine (PHENERGAN) 25 MG tablet Take 1 tablet (25 mg total) by mouth every 6 (six) hours as needed for nausea.  30 tablet  0  .  sodium bicarbonate 650 MG tablet Take 1 tablet (650 mg total) by mouth 3 (three) times daily.  90 tablet  3   No current facility-administered medications for this visit.    PHYSICAL EXAMINATION: ECOG PERFORMANCE STATUS: 0 - Asymptomatic GENERAL:alert, no distress and comfortable SKIN: skin color, texture, turgor are normal, no rashes or significant  lesions Musculoskeletal:no cyanosis of digits and no clubbing  NEURO: alert & oriented x 3 with fluent speech, no focal motor/sensory deficits  LABORATORY DATA:  I have reviewed the data as listed    Component Value Date/Time   NA 141 03/07/2014 1502   NA 143 12/30/2013 1231   K 3.9 03/07/2014 1502   K 3.7 12/30/2013 1231   CL 106 03/07/2014 1502   CL 111* 03/29/2013 1351   CO2 22 03/07/2014 1502   CO2 22 12/30/2013 1231   GLUCOSE 121* 03/07/2014 1502   GLUCOSE 116 12/30/2013 1231   GLUCOSE 129* 03/29/2013 1351   BUN 19 03/07/2014 1502   BUN 21.4 12/30/2013 1231   CREATININE 1.02 03/07/2014 1502   CREATININE 1.2* 12/30/2013 1231   CREATININE 1.00 05/02/2013 1412   CALCIUM 9.2 03/07/2014 1502   CALCIUM 9.4 12/30/2013 1231   PROT 6.8 03/07/2014 1502   PROT 7.0 12/30/2013 1231   ALBUMIN 3.3* 03/07/2014 1502   ALBUMIN 3.5 12/30/2013 1231   AST 22 03/07/2014 1502   AST 17 12/30/2013 1231   ALT 16 03/07/2014 1502   ALT 23 12/30/2013 1231   ALKPHOS 91 03/07/2014 1502   ALKPHOS 132 12/30/2013 1231   BILITOT 0.2* 03/07/2014 1502   BILITOT 0.32 12/30/2013 1231   GFRNONAA 67 05/02/2013 1412   GFRNONAA 12* 02/04/2013 0850   GFRAA 77 05/02/2013 1412   GFRAA 14* 02/04/2013 0850    No results found for this basename: SPEP,  UPEP,   kappa and lambda light chains    Lab Results  Component Value Date   WBC 4.7 03/07/2014   NEUTROABS 2.1 03/07/2014   HGB 11.9 03/07/2014   HCT 36.2 03/07/2014   MCV 101.2* 03/07/2014   PLT 191 03/07/2014      Chemistry      Component Value Date/Time   NA 141 03/07/2014 1502   NA 143 12/30/2013 1231   K 3.9 03/07/2014 1502   K 3.7 12/30/2013 1231   CL 106 03/07/2014 1502   CL 111* 03/29/2013 1351   CO2 22 03/07/2014 1502   CO2 22 12/30/2013 1231   BUN 19 03/07/2014 1502   BUN 21.4 12/30/2013 1231   CREATININE 1.02 03/07/2014 1502   CREATININE 1.2* 12/30/2013 1231   CREATININE 1.00 05/02/2013 1412      Component Value Date/Time   CALCIUM 9.2 03/07/2014 1502   CALCIUM 9.4 12/30/2013  1231   ALKPHOS 91 03/07/2014 1502   ALKPHOS 132 12/30/2013 1231   AST 22 03/07/2014 1502   AST 17 12/30/2013 1231   ALT 16 03/07/2014 1502   ALT 23 12/30/2013 1231   BILITOT 0.2* 03/07/2014 1502   BILITOT 0.32 12/30/2013 1231     ASSESSMENT & PLAN:  #1 multiple myeloma, in [facility]GPR The patient has received induction chemotherapy with Velcade, dexamethasone and Revlimid. She has achieved very good partial response. There is a delay of referral of the patient the bone marrow transplant service due to insurance issue. She was then placed on Revlimid only, 15 mg by mouth for 3 weeks, off 1 week.  However, M spike and serum kappa light chains started to get worse.  Revlimid was discontinued. The patient was frustrated because of lack of communication from here and her transplant center. I made her an appointment a month ago which she failed to show up. Subsequently, when she showed up, I could not work her into my schedule and relayed the information to my nurse to inform her that Revlimid was discontinued because her disease appeared to be progressing. While she was undergoing transplant evaluation, her transplant physicians were not aware of this event.  I left them several messages informing them that Revlimid is no longer working. Her transplant date is set for July 1st, 2015. Her transplant physician wants her to receive some form of systemic treatment before then. After much discussion, we are in agreement to just treat her with weekly Velcade along with dexamethasone. I will proceed to give her one dose of Zometa today as well. Despite trying to explain the situation to the patient today, she still expressed significant dissatisfaction about the events that happened over the last month. I contacted the administrator to talk to the patient next week. I will proceed to treat her until her transplant is scheduled. After her transplant, if she wants to transfer her care someone else, I would be glad to  assist in arranging for transfer to another physician. #2 HIV infection This is not active. She will continue her antiretroviral therapy. #3 hypertension The patient is asymptomatic. We will observe. #4 antimicrobial prophylaxis She will continue acyclovir  #5 peripheral neuropathy She had peripheral neuropathy likely HIV associated before chemotherapy. While she was on Velcade, and it got worse. At present time I will observe only. #6 DVT prophylaxis I recommend she continue on aspirin   Orders Placed This Encounter  Procedures  . Comprehensive metabolic panel  . CBC with Differential    Standing Status: Standing     Number of Occurrences: 5     Standing Expiration Date: 03/08/2015  . Comprehensive metabolic panel    Standing Status: Standing     Number of Occurrences: 5     Standing Expiration Date: 03/08/2015   All questions were answered. The patient knows to call the clinic with any problems, questions or concerns. No barriers to learning was detected. I spent 40 minutes counseling the patient face to face. The total time spent in the appointment was 55 minutes and more than 50% was on counseling and review of test results     Heath Lark, MD 03/09/2014 2:22 PM

## 2014-03-14 ENCOUNTER — Other Ambulatory Visit (HOSPITAL_BASED_OUTPATIENT_CLINIC_OR_DEPARTMENT_OTHER): Payer: 59

## 2014-03-14 ENCOUNTER — Ambulatory Visit (HOSPITAL_BASED_OUTPATIENT_CLINIC_OR_DEPARTMENT_OTHER): Payer: 59

## 2014-03-14 ENCOUNTER — Ambulatory Visit: Payer: 59

## 2014-03-14 VITALS — BP 155/93 | HR 83 | Temp 98.7°F | Resp 18

## 2014-03-14 DIAGNOSIS — C9000 Multiple myeloma not having achieved remission: Secondary | ICD-10-CM

## 2014-03-14 DIAGNOSIS — Z95828 Presence of other vascular implants and grafts: Secondary | ICD-10-CM

## 2014-03-14 DIAGNOSIS — Z5112 Encounter for antineoplastic immunotherapy: Secondary | ICD-10-CM

## 2014-03-14 LAB — COMPREHENSIVE METABOLIC PANEL (CC13)
ALT: 18 U/L (ref 0–55)
AST: 18 U/L (ref 5–34)
Albumin: 3.4 g/dL — ABNORMAL LOW (ref 3.5–5.0)
Alkaline Phosphatase: 98 U/L (ref 40–150)
Anion Gap: 13 mEq/L — ABNORMAL HIGH (ref 3–11)
BUN: 18.6 mg/dL (ref 7.0–26.0)
CHLORIDE: 112 meq/L — AB (ref 98–109)
CO2: 19 mEq/L — ABNORMAL LOW (ref 22–29)
Calcium: 9.6 mg/dL (ref 8.4–10.4)
Creatinine: 1.2 mg/dL — ABNORMAL HIGH (ref 0.6–1.1)
GLUCOSE: 123 mg/dL (ref 70–140)
POTASSIUM: 3.8 meq/L (ref 3.5–5.1)
Sodium: 144 mEq/L (ref 136–145)
Total Bilirubin: 0.3 mg/dL (ref 0.20–1.20)
Total Protein: 7 g/dL (ref 6.4–8.3)

## 2014-03-14 LAB — CBC WITH DIFFERENTIAL/PLATELET
BASO%: 0.2 % (ref 0.0–2.0)
BASOS ABS: 0 10*3/uL (ref 0.0–0.1)
EOS ABS: 0.1 10*3/uL (ref 0.0–0.5)
EOS%: 1.1 % (ref 0.0–7.0)
HEMATOCRIT: 37.6 % (ref 34.8–46.6)
HGB: 12.3 g/dL (ref 11.6–15.9)
LYMPH%: 53.9 % — ABNORMAL HIGH (ref 14.0–49.7)
MCH: 33.2 pg (ref 25.1–34.0)
MCHC: 32.8 g/dL (ref 31.5–36.0)
MCV: 101.4 fL — ABNORMAL HIGH (ref 79.5–101.0)
MONO#: 0.4 10*3/uL (ref 0.1–0.9)
MONO%: 9 % (ref 0.0–14.0)
NEUT#: 1.7 10*3/uL (ref 1.5–6.5)
NEUT%: 35.8 % — ABNORMAL LOW (ref 38.4–76.8)
Platelets: 219 10*3/uL (ref 145–400)
RBC: 3.71 10*6/uL (ref 3.70–5.45)
RDW: 15.4 % — AB (ref 11.2–14.5)
WBC: 4.9 10*3/uL (ref 3.9–10.3)
lymph#: 2.6 10*3/uL (ref 0.9–3.3)

## 2014-03-14 MED ORDER — ONDANSETRON HCL 8 MG PO TABS
8.0000 mg | ORAL_TABLET | Freq: Once | ORAL | Status: AC
  Administered 2014-03-14: 8 mg via ORAL

## 2014-03-14 MED ORDER — BORTEZOMIB CHEMO SQ INJECTION 3.5 MG (2.5MG/ML)
1.0000 mg/m2 | Freq: Once | INTRAMUSCULAR | Status: AC
  Administered 2014-03-14: 1.75 mg via SUBCUTANEOUS
  Filled 2014-03-14: qty 1.75

## 2014-03-14 MED ORDER — ONDANSETRON HCL 8 MG PO TABS
ORAL_TABLET | ORAL | Status: AC
  Filled 2014-03-14: qty 1

## 2014-03-14 MED ORDER — HEPARIN SOD (PORK) LOCK FLUSH 100 UNIT/ML IV SOLN
500.0000 [IU] | Freq: Once | INTRAVENOUS | Status: AC
  Administered 2014-03-14: 500 [IU] via INTRAVENOUS
  Filled 2014-03-14: qty 5

## 2014-03-14 MED ORDER — SODIUM CHLORIDE 0.9 % IJ SOLN
10.0000 mL | INTRAMUSCULAR | Status: DC | PRN
  Administered 2014-03-14: 10 mL via INTRAVENOUS
  Filled 2014-03-14: qty 10

## 2014-03-14 NOTE — Patient Instructions (Signed)
[facility] Discharge Instructions for Patients Receiving Chemotherapy  Today you received the following chemotherapy agents velcade.  To help prevent nausea and vomiting after your treatment, we encourage you to take your nausea medication compazine or phenergan.   If you develop nausea and vomiting that is not controlled by your nausea medication, call the clinic.   BELOW ARE SYMPTOMS THAT SHOULD BE REPORTED IMMEDIATELY:  *FEVER GREATER THAN 100.5 F  *CHILLS WITH OR WITHOUT FEVER  NAUSEA AND VOMITING THAT IS NOT CONTROLLED WITH YOUR NAUSEA MEDICATION  *UNUSUAL SHORTNESS OF BREATH  *UNUSUAL BRUISING OR BLEEDING  TENDERNESS IN MOUTH AND THROAT WITH OR WITHOUT PRESENCE OF ULCERS  *URINARY PROBLEMS  *BOWEL PROBLEMS  UNUSUAL RASH Items with * indicate a potential emergency and should be followed up as soon as possible.  Feel free to call the clinic you have any questions or concerns. The clinic phone number is [phone].

## 2014-03-14 NOTE — Progress Notes (Signed)
Patient states her B/P is elevated because she drank coffee.   Patient states she is doing well. Patient refused AVS.  She did get a print out of her lab work from today as requested.

## 2014-03-20 ENCOUNTER — Encounter: Payer: Self-pay | Admitting: Infectious Disease

## 2014-03-21 ENCOUNTER — Ambulatory Visit (HOSPITAL_BASED_OUTPATIENT_CLINIC_OR_DEPARTMENT_OTHER): Payer: 59

## 2014-03-21 ENCOUNTER — Other Ambulatory Visit (HOSPITAL_BASED_OUTPATIENT_CLINIC_OR_DEPARTMENT_OTHER): Payer: 59

## 2014-03-21 ENCOUNTER — Encounter: Payer: Self-pay | Admitting: Hematology and Oncology

## 2014-03-21 VITALS — BP 133/85 | HR 76 | Temp 97.8°F

## 2014-03-21 DIAGNOSIS — C9000 Multiple myeloma not having achieved remission: Secondary | ICD-10-CM

## 2014-03-21 DIAGNOSIS — Z5112 Encounter for antineoplastic immunotherapy: Secondary | ICD-10-CM

## 2014-03-21 DIAGNOSIS — Z95828 Presence of other vascular implants and grafts: Secondary | ICD-10-CM

## 2014-03-21 LAB — COMPREHENSIVE METABOLIC PANEL (CC13)
ALBUMIN: 3.2 g/dL — AB (ref 3.5–5.0)
ALT: 15 U/L (ref 0–55)
AST: 17 U/L (ref 5–34)
Alkaline Phosphatase: 86 U/L (ref 40–150)
Anion Gap: 9 mEq/L (ref 3–11)
BUN: 10.9 mg/dL (ref 7.0–26.0)
CO2: 22 mEq/L (ref 22–29)
CREATININE: 1 mg/dL (ref 0.6–1.1)
Calcium: 8.7 mg/dL (ref 8.4–10.4)
Chloride: 111 mEq/L — ABNORMAL HIGH (ref 98–109)
GLUCOSE: 131 mg/dL (ref 70–140)
POTASSIUM: 3.8 meq/L (ref 3.5–5.1)
Sodium: 142 mEq/L (ref 136–145)
Total Bilirubin: 0.41 mg/dL (ref 0.20–1.20)
Total Protein: 6.4 g/dL (ref 6.4–8.3)

## 2014-03-21 LAB — CBC WITH DIFFERENTIAL/PLATELET
BASO%: 0.3 % (ref 0.0–2.0)
BASOS ABS: 0 10*3/uL (ref 0.0–0.1)
EOS ABS: 0 10*3/uL (ref 0.0–0.5)
EOS%: 1 % (ref 0.0–7.0)
HEMATOCRIT: 35.2 % (ref 34.8–46.6)
HEMOGLOBIN: 11.5 g/dL — AB (ref 11.6–15.9)
LYMPH%: 49.3 % (ref 14.0–49.7)
MCH: 33.5 pg (ref 25.1–34.0)
MCHC: 32.6 g/dL (ref 31.5–36.0)
MCV: 102.7 fL — AB (ref 79.5–101.0)
MONO#: 0.5 10*3/uL (ref 0.1–0.9)
MONO%: 11.9 % (ref 0.0–14.0)
NEUT#: 1.6 10*3/uL (ref 1.5–6.5)
NEUT%: 37.5 % — AB (ref 38.4–76.8)
PLATELETS: 205 10*3/uL (ref 145–400)
RBC: 3.43 10*6/uL — ABNORMAL LOW (ref 3.70–5.45)
RDW: 15.4 % — AB (ref 11.2–14.5)
WBC: 4.3 10*3/uL (ref 3.9–10.3)
lymph#: 2.1 10*3/uL (ref 0.9–3.3)

## 2014-03-21 MED ORDER — SODIUM CHLORIDE 0.9 % IJ SOLN
10.0000 mL | INTRAMUSCULAR | Status: DC | PRN
  Administered 2014-03-21: 10 mL via INTRAVENOUS
  Filled 2014-03-21: qty 10

## 2014-03-21 MED ORDER — BORTEZOMIB CHEMO SQ INJECTION 3.5 MG (2.5MG/ML)
1.0000 mg/m2 | Freq: Once | INTRAMUSCULAR | Status: AC
  Administered 2014-03-21: 1.75 mg via SUBCUTANEOUS
  Filled 2014-03-21: qty 1.75

## 2014-03-21 MED ORDER — ONDANSETRON HCL 8 MG PO TABS
8.0000 mg | ORAL_TABLET | Freq: Once | ORAL | Status: AC
  Administered 2014-03-21: 8 mg via ORAL

## 2014-03-21 MED ORDER — HEPARIN SOD (PORK) LOCK FLUSH 100 UNIT/ML IV SOLN
500.0000 [IU] | Freq: Once | INTRAVENOUS | Status: AC
  Administered 2014-03-21: 500 [IU] via INTRAVENOUS
  Filled 2014-03-21: qty 5

## 2014-03-21 MED ORDER — ONDANSETRON HCL 8 MG PO TABS
ORAL_TABLET | ORAL | Status: AC
  Filled 2014-03-21: qty 1

## 2014-03-21 NOTE — Patient Instructions (Signed)
[facility] Discharge Instructions for Patients Receiving Chemotherapy  Today you received the following chemotherapy agents Velcade.  To help prevent nausea and vomiting after your treatment, we encourage you to take your nausea medication as directed.    If you develop nausea and vomiting that is not controlled by your nausea medication, call the clinic.   BELOW ARE SYMPTOMS THAT SHOULD BE REPORTED IMMEDIATELY:  *FEVER GREATER THAN 100.5 F  *CHILLS WITH OR WITHOUT FEVER  NAUSEA AND VOMITING THAT IS NOT CONTROLLED WITH YOUR NAUSEA MEDICATION  *UNUSUAL SHORTNESS OF BREATH  *UNUSUAL BRUISING OR BLEEDING  TENDERNESS IN MOUTH AND THROAT WITH OR WITHOUT PRESENCE OF ULCERS  *URINARY PROBLEMS  *BOWEL PROBLEMS  UNUSUAL RASH Items with * indicate a potential emergency and should be followed up as soon as possible.  Feel free to call the clinic you have any questions or concerns. The clinic phone number is [phone].    

## 2014-03-21 NOTE — Telephone Encounter (Signed)
Dr, Alvy Bimler,      May we proceed with cancelling this appointment? Thanks, Engineer, drilling, Triage

## 2014-03-21 NOTE — Patient Instructions (Signed)

## 2014-03-28 ENCOUNTER — Ambulatory Visit: Payer: 59

## 2014-03-28 ENCOUNTER — Other Ambulatory Visit (HOSPITAL_BASED_OUTPATIENT_CLINIC_OR_DEPARTMENT_OTHER): Payer: 59

## 2014-03-28 ENCOUNTER — Ambulatory Visit (HOSPITAL_BASED_OUTPATIENT_CLINIC_OR_DEPARTMENT_OTHER): Payer: 59

## 2014-03-28 ENCOUNTER — Other Ambulatory Visit: Payer: Self-pay | Admitting: Hematology and Oncology

## 2014-03-28 VITALS — BP 147/84 | HR 89 | Temp 97.3°F | Resp 19

## 2014-03-28 DIAGNOSIS — C9000 Multiple myeloma not having achieved remission: Secondary | ICD-10-CM

## 2014-03-28 DIAGNOSIS — Z5112 Encounter for antineoplastic immunotherapy: Secondary | ICD-10-CM

## 2014-03-28 DIAGNOSIS — Z95828 Presence of other vascular implants and grafts: Secondary | ICD-10-CM

## 2014-03-28 LAB — CBC WITH DIFFERENTIAL/PLATELET
BASO%: 0.4 % (ref 0.0–2.0)
Basophils Absolute: 0 10*3/uL (ref 0.0–0.1)
EOS%: 0.8 % (ref 0.0–7.0)
Eosinophils Absolute: 0 10*3/uL (ref 0.0–0.5)
HEMATOCRIT: 35.5 % (ref 34.8–46.6)
HEMOGLOBIN: 11.6 g/dL (ref 11.6–15.9)
LYMPH#: 2.2 10*3/uL (ref 0.9–3.3)
LYMPH%: 43.3 % (ref 14.0–49.7)
MCH: 32.9 pg (ref 25.1–34.0)
MCHC: 32.7 g/dL (ref 31.5–36.0)
MCV: 100.6 fL (ref 79.5–101.0)
MONO#: 0.7 10*3/uL (ref 0.1–0.9)
MONO%: 12.9 % (ref 0.0–14.0)
NEUT#: 2.2 10*3/uL (ref 1.5–6.5)
NEUT%: 42.6 % (ref 38.4–76.8)
Platelets: 199 10*3/uL (ref 145–400)
RBC: 3.53 10*6/uL — ABNORMAL LOW (ref 3.70–5.45)
RDW: 15.1 % — AB (ref 11.2–14.5)
WBC: 5.1 10*3/uL (ref 3.9–10.3)

## 2014-03-28 LAB — COMPREHENSIVE METABOLIC PANEL (CC13)
ALBUMIN: 3.4 g/dL — AB (ref 3.5–5.0)
ALT: 16 U/L (ref 0–55)
ANION GAP: 9 meq/L (ref 3–11)
AST: 19 U/L (ref 5–34)
Alkaline Phosphatase: 88 U/L (ref 40–150)
BUN: 20.7 mg/dL (ref 7.0–26.0)
CO2: 22 meq/L (ref 22–29)
CREATININE: 1.2 mg/dL — AB (ref 0.6–1.1)
Calcium: 8.8 mg/dL (ref 8.4–10.4)
Chloride: 113 mEq/L — ABNORMAL HIGH (ref 98–109)
Glucose: 110 mg/dl (ref 70–140)
POTASSIUM: 3.9 meq/L (ref 3.5–5.1)
Sodium: 144 mEq/L (ref 136–145)
TOTAL PROTEIN: 6.7 g/dL (ref 6.4–8.3)
Total Bilirubin: 0.24 mg/dL (ref 0.20–1.20)

## 2014-03-28 MED ORDER — ONDANSETRON HCL 8 MG PO TABS
ORAL_TABLET | ORAL | Status: AC
  Filled 2014-03-28: qty 1

## 2014-03-28 MED ORDER — HEPARIN SOD (PORK) LOCK FLUSH 100 UNIT/ML IV SOLN
500.0000 [IU] | Freq: Once | INTRAVENOUS | Status: AC
  Administered 2014-03-28: 500 [IU] via INTRAVENOUS
  Filled 2014-03-28: qty 5

## 2014-03-28 MED ORDER — SODIUM CHLORIDE 0.9 % IJ SOLN
10.0000 mL | Freq: Once | INTRAMUSCULAR | Status: AC
  Administered 2014-03-28: 10 mL via INTRAVENOUS
  Filled 2014-03-28: qty 10

## 2014-03-28 MED ORDER — SODIUM CHLORIDE 0.9 % IJ SOLN
10.0000 mL | INTRAMUSCULAR | Status: DC | PRN
  Administered 2014-03-28: 10 mL via INTRAVENOUS
  Filled 2014-03-28: qty 10

## 2014-03-28 MED ORDER — BORTEZOMIB CHEMO SQ INJECTION 3.5 MG (2.5MG/ML)
1.0000 mg/m2 | Freq: Once | INTRAMUSCULAR | Status: AC
  Administered 2014-03-28: 1.75 mg via SUBCUTANEOUS
  Filled 2014-03-28: qty 1.75

## 2014-03-28 MED ORDER — ONDANSETRON HCL 8 MG PO TABS
8.0000 mg | ORAL_TABLET | Freq: Once | ORAL | Status: AC
  Administered 2014-03-28: 8 mg via ORAL

## 2014-03-28 NOTE — Patient Instructions (Signed)

## 2014-03-28 NOTE — Patient Instructions (Signed)
[facility] Discharge Instructions for Patients Receiving Chemotherapy  Today you received the following chemotherapy agents: Velcade  To help prevent nausea and vomiting after your treatment, we encourage you to take your nausea medication: Compazine 10 mg every 6 hrs as needed.    If you develop nausea and vomiting that is not controlled by your nausea medication, call the clinic.   BELOW ARE SYMPTOMS THAT SHOULD BE REPORTED IMMEDIATELY:  *FEVER GREATER THAN 100.5 F  *CHILLS WITH OR WITHOUT FEVER  NAUSEA AND VOMITING THAT IS NOT CONTROLLED WITH YOUR NAUSEA MEDICATION  *UNUSUAL SHORTNESS OF BREATH  *UNUSUAL BRUISING OR BLEEDING  TENDERNESS IN MOUTH AND THROAT WITH OR WITHOUT PRESENCE OF ULCERS  *URINARY PROBLEMS  *BOWEL PROBLEMS  UNUSUAL RASH Items with * indicate a potential emergency and should be followed up as soon as possible.  Feel free to call the clinic you have any questions or concerns. The clinic phone number is [phone].    

## 2014-04-02 ENCOUNTER — Encounter: Payer: Self-pay | Admitting: Licensed Clinical Social Worker

## 2014-04-02 ENCOUNTER — Telehealth: Payer: Self-pay | Admitting: Hematology & Oncology

## 2014-04-02 NOTE — Telephone Encounter (Signed)
Pt aware of 6-30 appointment with Dr. Marin Olp. She wants to switch care to [place].

## 2014-04-02 NOTE — Progress Notes (Signed)
CM Jacob Moores) discharged CT this day-per CT's request, no longer needing assistance/stable. CM has completed ICAP re-certification and will provide to clinic when complete.

## 2014-04-03 ENCOUNTER — Other Ambulatory Visit: Payer: Self-pay | Admitting: Hematology and Oncology

## 2014-04-03 ENCOUNTER — Telehealth: Payer: Self-pay | Admitting: *Deleted

## 2014-04-03 NOTE — Telephone Encounter (Signed)
I will put Velcade back X 1 tomorrow

## 2014-04-03 NOTE — Telephone Encounter (Signed)
Called patient for clarification on appt for 04/04/2014. Patient had previously sent email to cancel, however appt was not. So this nurse called and spoke to patient, she states Dr Ok Edwards DOES want her to have Velcade on 04/04/2014. This nurse has also called BMT team to call us back and confirm this. Appt has been left as scheduled at this time.

## 2014-04-03 NOTE — Telephone Encounter (Addendum)
Rec'd call back from Conchita Paris, RN BMT Coordinator for this patient. Apparently patient will not have proper care at home for her Bone Marrow Immobilization that was scheduled in 2 weeks, and this will be rescheduled to 05/07/2014. Dr Ok Edwards wants AT LEAST 2 weeks off all treatment. Therefore, patient should receive her Velcade on tomorrow 04/04/2014. Patient will be establishing care with Dr Lattie Haw at our [facility] on 04/08/2014. Will forward this information to him so he will know to contact Dr Ok Edwards regarding any future decisions regarding treatment and transplant. Any questions, Call Raynaldo Opitz @ [phone], fax [phone]

## 2014-04-04 ENCOUNTER — Ambulatory Visit (HOSPITAL_BASED_OUTPATIENT_CLINIC_OR_DEPARTMENT_OTHER): Payer: 59

## 2014-04-04 ENCOUNTER — Other Ambulatory Visit (HOSPITAL_BASED_OUTPATIENT_CLINIC_OR_DEPARTMENT_OTHER): Payer: 59

## 2014-04-04 ENCOUNTER — Ambulatory Visit: Payer: 59

## 2014-04-04 VITALS — BP 145/86 | HR 81 | Temp 98.0°F | Resp 18

## 2014-04-04 DIAGNOSIS — Z95828 Presence of other vascular implants and grafts: Secondary | ICD-10-CM

## 2014-04-04 DIAGNOSIS — C9000 Multiple myeloma not having achieved remission: Secondary | ICD-10-CM

## 2014-04-04 DIAGNOSIS — Z5112 Encounter for antineoplastic immunotherapy: Secondary | ICD-10-CM

## 2014-04-04 LAB — CBC WITH DIFFERENTIAL/PLATELET
BASO%: 0.3 % (ref 0.0–2.0)
BASOS ABS: 0 10*3/uL (ref 0.0–0.1)
EOS%: 0.8 % (ref 0.0–7.0)
Eosinophils Absolute: 0.1 10*3/uL (ref 0.0–0.5)
HEMATOCRIT: 37.1 % (ref 34.8–46.6)
HEMOGLOBIN: 12.3 g/dL (ref 11.6–15.9)
LYMPH#: 2.4 10*3/uL (ref 0.9–3.3)
LYMPH%: 37.9 % (ref 14.0–49.7)
MCH: 34 pg (ref 25.1–34.0)
MCHC: 33 g/dL (ref 31.5–36.0)
MCV: 103 fL — ABNORMAL HIGH (ref 79.5–101.0)
MONO#: 0.7 10*3/uL (ref 0.1–0.9)
MONO%: 11.4 % (ref 0.0–14.0)
NEUT#: 3.2 10*3/uL (ref 1.5–6.5)
NEUT%: 49.6 % (ref 38.4–76.8)
Platelets: 194 10*3/uL (ref 145–400)
RBC: 3.61 10*6/uL — ABNORMAL LOW (ref 3.70–5.45)
RDW: 15.5 % — ABNORMAL HIGH (ref 11.2–14.5)
WBC: 6.4 10*3/uL (ref 3.9–10.3)

## 2014-04-04 LAB — COMPREHENSIVE METABOLIC PANEL (CC13)
ALBUMIN: 3.7 g/dL (ref 3.5–5.0)
ALT: 17 U/L (ref 0–55)
AST: 20 U/L (ref 5–34)
Alkaline Phosphatase: 94 U/L (ref 40–150)
Anion Gap: 11 mEq/L (ref 3–11)
BUN: 20.9 mg/dL (ref 7.0–26.0)
CHLORIDE: 107 meq/L (ref 98–109)
CO2: 22 mEq/L (ref 22–29)
Calcium: 9.4 mg/dL (ref 8.4–10.4)
Creatinine: 1.2 mg/dL — ABNORMAL HIGH (ref 0.6–1.1)
Glucose: 118 mg/dl (ref 70–140)
Potassium: 4.2 mEq/L (ref 3.5–5.1)
Sodium: 141 mEq/L (ref 136–145)
Total Bilirubin: 0.23 mg/dL (ref 0.20–1.20)
Total Protein: 7.2 g/dL (ref 6.4–8.3)

## 2014-04-04 MED ORDER — ONDANSETRON HCL 8 MG PO TABS
ORAL_TABLET | ORAL | Status: AC
  Filled 2014-04-04: qty 1

## 2014-04-04 MED ORDER — ZOLEDRONIC ACID 4 MG/5ML IV CONC
4.0000 mg | Freq: Once | INTRAVENOUS | Status: AC
  Administered 2014-04-04: 4 mg via INTRAVENOUS
  Filled 2014-04-04: qty 5

## 2014-04-04 MED ORDER — HEPARIN SOD (PORK) LOCK FLUSH 100 UNIT/ML IV SOLN
500.0000 [IU] | Freq: Once | INTRAVENOUS | Status: AC
  Administered 2014-04-04: 500 [IU] via INTRAVENOUS
  Filled 2014-04-04: qty 5

## 2014-04-04 MED ORDER — SODIUM CHLORIDE 0.9 % IJ SOLN
10.0000 mL | INTRAMUSCULAR | Status: DC | PRN
  Administered 2014-04-04: 10 mL
  Filled 2014-04-04: qty 10

## 2014-04-04 MED ORDER — ONDANSETRON HCL 8 MG PO TABS
8.0000 mg | ORAL_TABLET | Freq: Once | ORAL | Status: AC
  Administered 2014-04-04: 8 mg via ORAL

## 2014-04-04 MED ORDER — SODIUM CHLORIDE 0.9 % IJ SOLN
10.0000 mL | INTRAMUSCULAR | Status: DC | PRN
  Administered 2014-04-04: 10 mL via INTRAVENOUS
  Filled 2014-04-04: qty 10

## 2014-04-04 MED ORDER — HEPARIN SOD (PORK) LOCK FLUSH 100 UNIT/ML IV SOLN
500.0000 [IU] | Freq: Once | INTRAVENOUS | Status: AC | PRN
  Administered 2014-04-04: 500 [IU]
  Filled 2014-04-04: qty 5

## 2014-04-04 MED ORDER — BORTEZOMIB CHEMO SQ INJECTION 3.5 MG (2.5MG/ML)
1.7500 mg | Freq: Once | INTRAMUSCULAR | Status: AC
  Administered 2014-04-04: 1.75 mg via SUBCUTANEOUS
  Filled 2014-04-04: qty 1.75

## 2014-04-04 NOTE — Patient Instructions (Signed)
[facility] Discharge Instructions for Patients Receiving Chemotherapy  Today you received the following chemotherapy agents Zometa and Velcade.  To help prevent nausea and vomiting after your treatment, we encourage you to take your nausea medication.   If you develop nausea and vomiting that is not controlled by your nausea medication, call the clinic.   BELOW ARE SYMPTOMS THAT SHOULD BE REPORTED IMMEDIATELY:  *FEVER GREATER THAN 100.5 F  *CHILLS WITH OR WITHOUT FEVER  NAUSEA AND VOMITING THAT IS NOT CONTROLLED WITH YOUR NAUSEA MEDICATION  *UNUSUAL SHORTNESS OF BREATH  *UNUSUAL BRUISING OR BLEEDING  TENDERNESS IN MOUTH AND THROAT WITH OR WITHOUT PRESENCE OF ULCERS  *URINARY PROBLEMS  *BOWEL PROBLEMS  UNUSUAL RASH Items with * indicate a potential emergency and should be followed up as soon as possible.  Feel free to call the clinic you have any questions or concerns. The clinic phone number is [phone].

## 2014-04-08 ENCOUNTER — Ambulatory Visit (HOSPITAL_BASED_OUTPATIENT_CLINIC_OR_DEPARTMENT_OTHER): Payer: 59 | Admitting: Hematology & Oncology

## 2014-04-08 ENCOUNTER — Encounter: Payer: Self-pay | Admitting: Hematology & Oncology

## 2014-04-08 ENCOUNTER — Other Ambulatory Visit: Payer: Self-pay | Admitting: Lab

## 2014-04-08 VITALS — BP 153/80 | HR 86 | Temp 97.6°F | Resp 14 | Ht 62.0 in | Wt 182.0 lb

## 2014-04-08 DIAGNOSIS — C9000 Multiple myeloma not having achieved remission: Secondary | ICD-10-CM

## 2014-04-08 DIAGNOSIS — Z21 Asymptomatic human immunodeficiency virus [HIV] infection status: Secondary | ICD-10-CM

## 2014-04-08 NOTE — Progress Notes (Signed)
Hematology and Oncology Follow Up Visit  Jennifer Boyle [id number] 1966/09/09 48 y.o. 04/08/2014   Principle Diagnosis:   IgG kappa myeloma   HIV-positive  Current Therapy:    Velcade every week  Zometa 4 mg IV every 3 weeks     Interim History:  Ms.  Boyle is back to our office. This is Jennifer first office visit. She had been seen at the main cancer Center. However, she lives and works out in our area. She has myeloma. She has HIV. She is a traveling Engineer, civil (consulting). She currently is working at [employer].  She is being seen in [facility]. She is getting ready for a bone marrow transplant. This would be a stem cell transplant. The issues with care for Jennifer at home. She is able waiting for Jennifer Boyle did come down from [place] to by mouth help out.  She had a bone marrow biopsy done at [facility]. It appears she had a good response. I think she may have had 5% plasma cells. She still has the complex cytogenetics. She is hyperdiploid.  She had improve we on Revlimid. She has been off this since April. She's currently on Velcade alone. She does this weekly. That she feels okay. She's had no pain. She's had no nausea vomiting. There's been no change in bowel or bladder habits. She does get some constipation which he is on Velcade.  There is no issues with the HIV. She is on the typical "cocktail" for this.  She's had no fever. There's been no rashes. Jennifer overall performance status is ECOG 1.          Medications: Current outpatient prescriptions:Abacavir-Dolutegravir-Lamivud (TRIUMEQ) 600-50-300 MG TABS, Take 1 tablet by mouth daily., Disp: 30 tablet, Rfl: 11;  acyclovir (ZOVIRAX) 400 MG tablet, Take 0.5 tablets (200 mg total) by mouth daily., Disp: 60 tablet, Rfl: 3;  aspirin 81 MG tablet, Take 81 mg by mouth daily., Disp: , Rfl: ;  calcium carbonate (OS-CAL) 600 MG TABS tablet, Take 600 mg by mouth daily with breakfast., Disp: , Rfl:  cholecalciferol (VITAMIN D) 1000  UNITS tablet, Take 1 tablet (1,000 Units total) by mouth daily., Disp: 30 tablet, Rfl: 3;  docusate sodium (COLACE) 100 MG capsule, Take 100 mg by mouth 2 (two) times daily., Disp: , Rfl: ;  lidocaine-prilocaine (EMLA) cream, Apply topically as needed. Apply to portacath one hour before chemo access or blood draw., Disp: 30 g, Rfl: 3 polyethylene glycol (MIRALAX / GLYCOLAX) packet, Take 17 g by mouth daily., Disp: , Rfl: ;  prochlorperazine (COMPAZINE) 10 MG tablet, Take 1 tablet (10 mg total) by mouth every 6 (six) hours as needed (Nausea or vomiting)., Disp: 30 tablet, Rfl: 1;  promethazine (PHENERGAN) 25 MG tablet, Take 1 tablet (25 mg total) by mouth every 6 (six) hours as needed for nausea., Disp: 30 tablet, Rfl: 0 sodium bicarbonate 650 MG tablet, Take 1 tablet (650 mg total) by mouth 3 (three) times daily., Disp: 90 tablet, Rfl: 3  Allergies:  Allergies  Allergen Reactions  . Aspirin     Upset stomach   . Efavirenz Hives and Rash    (brand name sustiva)    Past Medical History, Surgical history, Social history, and Family History were reviewed and updated.  Review of Systems: As above  Physical Exam:  height is 5\' 2"  (1.575 m) and weight is 182 lb (82.555 kg). Jennifer oral temperature is 97.6 F (36.4 C). Jennifer blood pressure is 153/80 and Jennifer  pulse is 86. Jennifer respiration is 14.   Well-developed and well-nourished Afro-American female. Jennifer head and neck exam shows no ocular or oral lesions. There are no palpable cervical or supraclavicular lymph nodes. Lungs are clear bilaterally. Cardiac exam regular in rhythm with a normal S1 and S2. There are no murmurs rubs or bruits. Abdomen is soft. Has good bowel sounds. There is no fluid wave. There is no palpable liver or spleen tip. Back exam shows no tenderness over the spine was or hips. Extremities shows no clubbing cyanosis or edema. Neurological exam is nonfocal.  Lab Results  Component Value Date   WBC 6.4 04/04/2014   HGB 12.3 04/04/2014    HCT 37.1 04/04/2014   MCV 103.0* 04/04/2014   PLT 194 04/04/2014     Chemistry      Component Value Date/Time   NA 141 04/04/2014 0852   NA 141 03/07/2014 1502   K 4.2 04/04/2014 0852   K 3.9 03/07/2014 1502   CL 106 03/07/2014 1502   CL 111* 03/29/2013 1351   CO2 22 04/04/2014 0852   CO2 22 03/07/2014 1502   BUN 20.9 04/04/2014 0852   BUN 19 03/07/2014 1502   CREATININE 1.2* 04/04/2014 0852   CREATININE 1.02 03/07/2014 1502   CREATININE 1.00 05/02/2013 1412      Component Value Date/Time   CALCIUM 9.4 04/04/2014 0852   CALCIUM 9.2 03/07/2014 1502   ALKPHOS 94 04/04/2014 0852   ALKPHOS 91 03/07/2014 1502   AST 20 04/04/2014 0852   AST 22 03/07/2014 1502   ALT 17 04/04/2014 0852   ALT 16 03/07/2014 1502   BILITOT 0.23 04/04/2014 0852   BILITOT 0.2* 03/07/2014 1502         Impression and Plan: Jennifer Boyle is 48 year old African American female. She is HIV positive. She has IgG kappa myeloma.  Looks like she is doing fairly well by Jennifer recent bone marrow test. This has to be a good indicator that she has responded nicely to treatment. Jennifer Boyle did a fantastic job with that getting Jennifer myeloma under good control. Again she is ready for transplant from my point of view. As a matter of logistics and make sure that she has help at home.  Hopefully, the transplant workup in preparation will begin the and of July.  We will treat Jennifer weekly with Velcade. We will  plan to see Jennifer back in about 3 weeks.   Jennifer Napoleon, MD 6/30/20159:29 AM

## 2014-04-09 ENCOUNTER — Telehealth: Payer: Self-pay | Admitting: Hematology & Oncology

## 2014-04-09 ENCOUNTER — Other Ambulatory Visit: Payer: Self-pay | Admitting: *Deleted

## 2014-04-09 DIAGNOSIS — C9000 Multiple myeloma not having achieved remission: Secondary | ICD-10-CM

## 2014-04-09 NOTE — Telephone Encounter (Signed)
[facility] BORTEZOMIB INJECTION APPROVED AUTH: [id number]  VALID:  03/13/2014 TO 03/14/2015  [id number] PR CHEMOTHER, NON-HORMONE ANTI-NEOPL, SUB-Q/IM [id number] PR ONDANSETRON ORAL  [id number] PR STERILE WATER/SALINE, 10 ML  [id number] PR INJ HEPARIN SODIUM PER 10 U    Multiple Myeloma - Primary 203.00

## 2014-04-10 ENCOUNTER — Ambulatory Visit (HOSPITAL_BASED_OUTPATIENT_CLINIC_OR_DEPARTMENT_OTHER): Payer: 59

## 2014-04-10 ENCOUNTER — Ambulatory Visit: Payer: Self-pay | Admitting: Family

## 2014-04-10 ENCOUNTER — Other Ambulatory Visit (HOSPITAL_BASED_OUTPATIENT_CLINIC_OR_DEPARTMENT_OTHER): Payer: 59 | Admitting: Lab

## 2014-04-10 ENCOUNTER — Other Ambulatory Visit: Payer: Self-pay | Admitting: *Deleted

## 2014-04-10 ENCOUNTER — Ambulatory Visit: Payer: Self-pay

## 2014-04-10 ENCOUNTER — Other Ambulatory Visit: Payer: Self-pay | Admitting: Lab

## 2014-04-10 VITALS — BP 124/80 | HR 83 | Temp 97.5°F | Resp 14

## 2014-04-10 DIAGNOSIS — Z5112 Encounter for antineoplastic immunotherapy: Secondary | ICD-10-CM

## 2014-04-10 DIAGNOSIS — C9000 Multiple myeloma not having achieved remission: Secondary | ICD-10-CM

## 2014-04-10 DIAGNOSIS — B20 Human immunodeficiency virus [HIV] disease: Secondary | ICD-10-CM

## 2014-04-10 LAB — CBC WITH DIFFERENTIAL (CANCER CENTER ONLY)
BASO#: 0 10*3/uL (ref 0.0–0.2)
BASO%: 0.2 % (ref 0.0–2.0)
EOS ABS: 0.1 10*3/uL (ref 0.0–0.5)
EOS%: 1.2 % (ref 0.0–7.0)
HCT: 36.6 % (ref 34.8–46.6)
HGB: 12.5 g/dL (ref 11.6–15.9)
LYMPH#: 2.5 10*3/uL (ref 0.9–3.3)
LYMPH%: 43.4 % (ref 14.0–48.0)
MCH: 34.6 pg — ABNORMAL HIGH (ref 26.0–34.0)
MCHC: 34.2 g/dL (ref 32.0–36.0)
MCV: 101 fL (ref 81–101)
MONO#: 0.7 10*3/uL (ref 0.1–0.9)
MONO%: 11.7 % (ref 0.0–13.0)
NEUT%: 43.5 % (ref 39.6–80.0)
NEUTROS ABS: 2.5 10*3/uL (ref 1.5–6.5)
Platelets: 202 10*3/uL (ref 145–400)
RBC: 3.61 10*6/uL — AB (ref 3.70–5.32)
RDW: 14.3 % (ref 11.1–15.7)
WBC: 5.7 10*3/uL (ref 3.9–10.0)

## 2014-04-10 LAB — CMP (CANCER CENTER ONLY)
ALBUMIN: 3.4 g/dL (ref 3.3–5.5)
ALT(SGPT): 17 U/L (ref 10–47)
AST: 26 U/L (ref 11–38)
Alkaline Phosphatase: 80 U/L (ref 26–84)
BUN: 20 mg/dL (ref 7–22)
CALCIUM: 9 mg/dL (ref 8.0–10.3)
CHLORIDE: 102 meq/L (ref 98–108)
CO2: 25 mEq/L (ref 18–33)
Creat: 1.1 mg/dl (ref 0.6–1.2)
Glucose, Bld: 109 mg/dL (ref 73–118)
Potassium: 3.6 mEq/L (ref 3.3–4.7)
Sodium: 141 mEq/L (ref 128–145)
TOTAL PROTEIN: 7.2 g/dL (ref 6.4–8.1)
Total Bilirubin: 0.6 mg/dl (ref 0.20–1.60)

## 2014-04-10 MED ORDER — SODIUM BICARBONATE 650 MG PO TABS: 650.0000 mg | ORAL_TABLET | Freq: Three times a day (TID) | ORAL | Status: DC

## 2014-04-10 MED ORDER — ONDANSETRON HCL 8 MG PO TABS
8.0000 mg | ORAL_TABLET | Freq: Once | ORAL | Status: AC
  Administered 2014-04-10: 8 mg via ORAL

## 2014-04-10 MED ORDER — ONDANSETRON HCL 8 MG PO TABS
ORAL_TABLET | ORAL | Status: AC
  Filled 2014-04-10: qty 1

## 2014-04-10 MED ORDER — BORTEZOMIB CHEMO SQ INJECTION 3.5 MG (2.5MG/ML)
2.0000 mg | Freq: Once | INTRAMUSCULAR | Status: AC
  Administered 2014-04-10: 2 mg via SUBCUTANEOUS
  Filled 2014-04-10: qty 2

## 2014-04-10 MED ORDER — HEPARIN SOD (PORK) LOCK FLUSH 100 UNIT/ML IV SOLN
500.0000 [IU] | Freq: Once | INTRAVENOUS | Status: AC
  Administered 2014-04-10: 500 [IU] via INTRAVENOUS
  Filled 2014-04-10: qty 5

## 2014-04-10 MED ORDER — SODIUM CHLORIDE 0.9 % IJ SOLN
10.0000 mL | INTRAMUSCULAR | Status: DC | PRN
  Administered 2014-04-10: 10 mL via INTRAVENOUS
  Filled 2014-04-10: qty 10

## 2014-04-10 NOTE — Patient Instructions (Signed)
Bortezomib injection What is this medicine? BORTEZOMIB (bor TEZ oh mib) is a chemotherapy drug. It slows the growth of cancer cells. This medicine is used to treat multiple myeloma, and certain lymphomas, such as mantle-cell lymphoma. This medicine may be used for other purposes; ask your health care provider or pharmacist if you have questions. COMMON BRAND NAME(S): Velcade What should I tell my health care provider before I take this medicine? They need to know if you have any of these conditions: -diabetes -heart disease -irregular heartbeat -liver disease -on hemodialysis -low blood counts, like low white blood cells, platelets, or hemoglobin -peripheral neuropathy -taking medicine for blood pressure -an unusual or allergic reaction to bortezomib, mannitol, boron, other medicines, foods, dyes, or preservatives -pregnant or trying to get pregnant -breast-feeding How should I use this medicine? This medicine is for injection into a vein or for injection under the skin. It is given by a health care professional in a hospital or clinic setting. Talk to your pediatrician regarding the use of this medicine in children. Special care may be needed. Overdosage: If you think you have taken too much of this medicine contact a poison control center or emergency room at once. NOTE: This medicine is only for you. Do not share this medicine with others. What if I miss a dose? It is important not to miss your dose. Call your doctor or health care professional if you are unable to keep an appointment. What may interact with this medicine? This medicine may interact with the following medications: -ketoconazole -rifampin -ritonavir -St. John's Wort This list may not describe all possible interactions. Give your health care provider a list of all the medicines, herbs, non-prescription drugs, or dietary supplements you use. Also tell them if you smoke, drink alcohol, or use illegal drugs. Some items  may interact with your medicine. What should I watch for while using this medicine? Visit your doctor for checks on your progress. This drug may make you feel generally unwell. This is not uncommon, as chemotherapy can affect healthy cells as well as cancer cells. Report any side effects. Continue your course of treatment even though you feel ill unless your doctor tells you to stop. You may get drowsy or dizzy. Do not drive, use machinery, or do anything that needs mental alertness until you know how this medicine affects you. Do not stand or sit up quickly, especially if you are an older patient. This reduces the risk of dizzy or fainting spells. In some cases, you may be given additional medicines to help with side effects. Follow all directions for their use. Call your doctor or health care professional for advice if you get a fever, chills or sore throat, or other symptoms of a cold or flu. Do not treat yourself. This drug decreases your body's ability to fight infections. Try to avoid being around people who are sick. This medicine may increase your risk to bruise or bleed. Call your doctor or health care professional if you notice any unusual bleeding. You may need blood work done while you are taking this medicine. In some patients, this medicine may cause a serious brain infection that may cause death. If you have any problems seeing, thinking, speaking, walking, or standing, tell your doctor right away. If you cannot reach your doctor, urgently seek other source of medical care. Do not become pregnant while taking this medicine. Women should inform their doctor if they wish to become pregnant or think they might be pregnant. There is   a potential for serious side effects to an unborn child. Talk to your health care professional or pharmacist for more information. Do not breast-feed an infant while taking this medicine. Check with your doctor or health care professional if you get an attack of  severe diarrhea, nausea and vomiting, or if you sweat a lot. The loss of too much body fluid can make it dangerous for you to take this medicine. What side effects may I notice from receiving this medicine? Side effects that you should report to your doctor or health care professional as soon as possible: -allergic reactions like skin rash, itching or hives, swelling of the face, lips, or tongue -breathing problems -changes in hearing -changes in vision -fast, irregular heartbeat -feeling faint or lightheaded, falls -pain, tingling, numbness in the hands or feet -right upper belly pain -seizures -swelling of the ankles, feet, hands -unusual bleeding or bruising -unusually weak or tired -vomiting -yellowing of the eyes or skin Side effects that usually do not require medical attention (report to your doctor or health care professional if they continue or are bothersome): -changes in emotions or moods -constipation -diarrhea -loss of appetite -headache -irritation at site where injected -nausea This list may not describe all possible side effects. Call your doctor for medical advice about side effects. You may report side effects to FDA at 1-800-FDA-1088. Where should I keep my medicine? This drug is given in a hospital or clinic and will not be stored at home. NOTE: This sheet is a summary. It may not cover all possible information. If you have questions about this medicine, talk to your doctor, pharmacist, or health care provider.  2015, Elsevier/Gold Standard. (2013-07-22 12:46:32)  

## 2014-04-14 ENCOUNTER — Other Ambulatory Visit: Payer: Self-pay | Admitting: Hematology and Oncology

## 2014-04-16 LAB — UIFE/LIGHT CHAINS/TP QN, 24-HR UR
ALBUMIN, U: DETECTED
Alpha 1, Urine: DETECTED — AB
Alpha 2, Urine: DETECTED — AB
Beta, Urine: DETECTED — AB
FREE KAPPA LT CHAINS, UR: 1.31 mg/dL (ref 0.14–2.42)
FREE KAPPA/LAMBDA RATIO: 43.67 ratio — AB (ref 2.04–10.37)
FREE LAMBDA LT CHAINS, UR: 0.03 mg/dL (ref 0.02–0.67)
Free Lambda Excretion/Day: 0.6 mg/d
Free Lt Chn Excr Rate: 26.2 mg/d
GAMMA UR: DETECTED — AB
TOTAL PROTEIN, URINE-UPE24: 1.9 mg/dL
Time: 24 hours
Total Protein, Urine-Ur/day: 38 mg/d (ref 10–140)
VOLUME, URINE-UPE24: 2000 mL

## 2014-04-17 ENCOUNTER — Other Ambulatory Visit: Payer: Self-pay | Admitting: Lab

## 2014-04-17 ENCOUNTER — Ambulatory Visit (HOSPITAL_BASED_OUTPATIENT_CLINIC_OR_DEPARTMENT_OTHER): Payer: 59

## 2014-04-17 ENCOUNTER — Ambulatory Visit: Payer: Self-pay | Admitting: Family

## 2014-04-17 ENCOUNTER — Other Ambulatory Visit (HOSPITAL_BASED_OUTPATIENT_CLINIC_OR_DEPARTMENT_OTHER): Payer: 59 | Admitting: Lab

## 2014-04-17 ENCOUNTER — Ambulatory Visit: Payer: Self-pay

## 2014-04-17 ENCOUNTER — Encounter: Payer: Self-pay | Admitting: Pharmacist

## 2014-04-17 VITALS — BP 152/89 | HR 79 | Temp 97.0°F | Resp 14 | Ht 62.0 in | Wt 182.0 lb

## 2014-04-17 DIAGNOSIS — Z452 Encounter for adjustment and management of vascular access device: Secondary | ICD-10-CM

## 2014-04-17 DIAGNOSIS — C9000 Multiple myeloma not having achieved remission: Secondary | ICD-10-CM

## 2014-04-17 DIAGNOSIS — Z5112 Encounter for antineoplastic immunotherapy: Secondary | ICD-10-CM

## 2014-04-17 LAB — CBC WITH DIFFERENTIAL (CANCER CENTER ONLY)
BASO#: 0 10*3/uL (ref 0.0–0.2)
BASO%: 0.1 % (ref 0.0–2.0)
EOS ABS: 0.1 10*3/uL (ref 0.0–0.5)
EOS%: 0.8 % (ref 0.0–7.0)
HCT: 36.2 % (ref 34.8–46.6)
HEMOGLOBIN: 12.2 g/dL (ref 11.6–15.9)
LYMPH#: 3.4 10*3/uL — ABNORMAL HIGH (ref 0.9–3.3)
LYMPH%: 47.2 % (ref 14.0–48.0)
MCH: 34.4 pg — ABNORMAL HIGH (ref 26.0–34.0)
MCHC: 33.7 g/dL (ref 32.0–36.0)
MCV: 102 fL — AB (ref 81–101)
MONO#: 0.8 10*3/uL (ref 0.1–0.9)
MONO%: 10.8 % (ref 0.0–13.0)
NEUT#: 2.9 10*3/uL (ref 1.5–6.5)
NEUT%: 41.1 % (ref 39.6–80.0)
PLATELETS: 208 10*3/uL (ref 145–400)
RBC: 3.55 10*6/uL — AB (ref 3.70–5.32)
RDW: 14.1 % (ref 11.1–15.7)
WBC: 7.1 10*3/uL (ref 3.9–10.0)

## 2014-04-17 LAB — COMPREHENSIVE METABOLIC PANEL
ALBUMIN: 4 g/dL (ref 3.5–5.2)
ALT: 17 U/L (ref 0–35)
AST: 23 U/L (ref 0–37)
Alkaline Phosphatase: 88 U/L (ref 39–117)
BILIRUBIN TOTAL: 0.3 mg/dL (ref 0.2–1.2)
BUN: 20 mg/dL (ref 6–23)
CO2: 20 meq/L (ref 19–32)
Calcium: 9.2 mg/dL (ref 8.4–10.5)
Chloride: 105 mEq/L (ref 96–112)
Creatinine, Ser: 1.08 mg/dL (ref 0.50–1.10)
GLUCOSE: 110 mg/dL — AB (ref 70–99)
Potassium: 4.1 mEq/L (ref 3.5–5.3)
Sodium: 139 mEq/L (ref 135–145)
TOTAL PROTEIN: 6.7 g/dL (ref 6.0–8.3)

## 2014-04-17 MED ORDER — ONDANSETRON 8 MG/50ML IVPB (CHCC): 8.0000 mg | Freq: Once | INTRAVENOUS | Status: DC

## 2014-04-17 MED ORDER — ONDANSETRON HCL 8 MG PO TABS
ORAL_TABLET | ORAL | Status: AC
  Filled 2014-04-17: qty 1

## 2014-04-17 MED ORDER — BORTEZOMIB CHEMO IV INJECTION 3.5 MG: 1.3000 mg/m2 | Freq: Once | INTRAMUSCULAR | Status: DC

## 2014-04-17 MED ORDER — HEPARIN SOD (PORK) LOCK FLUSH 100 UNIT/ML IV SOLN
250.0000 [IU] | Freq: Once | INTRAVENOUS | Status: DC | PRN
  Filled 2014-04-17: qty 5

## 2014-04-17 MED ORDER — ONDANSETRON HCL 8 MG PO TABS
8.0000 mg | ORAL_TABLET | Freq: Once | ORAL | Status: AC
  Administered 2014-04-17: 8 mg via ORAL

## 2014-04-17 MED ORDER — BORTEZOMIB CHEMO SQ INJECTION 3.5 MG (2.5MG/ML)
1.3000 mg/m2 | Freq: Once | INTRAMUSCULAR | Status: AC
  Administered 2014-04-17: 2.5 mg via SUBCUTANEOUS
  Filled 2014-04-17: qty 2.5

## 2014-04-17 MED ORDER — SODIUM CHLORIDE 0.9 % IJ SOLN
10.0000 mL | INTRAMUSCULAR | Status: DC | PRN
  Administered 2014-04-17: 10 mL
  Filled 2014-04-17: qty 10

## 2014-04-17 MED ORDER — ALTEPLASE 2 MG IJ SOLR
2.0000 mg | Freq: Once | INTRAMUSCULAR | Status: DC | PRN
  Filled 2014-04-17: qty 2

## 2014-04-17 MED ORDER — SODIUM CHLORIDE 0.9 % IJ SOLN
3.0000 mL | INTRAMUSCULAR | Status: DC | PRN
  Filled 2014-04-17: qty 10

## 2014-04-17 MED ORDER — HEPARIN SOD (PORK) LOCK FLUSH 100 UNIT/ML IV SOLN
500.0000 [IU] | Freq: Once | INTRAVENOUS | Status: AC | PRN
  Administered 2014-04-17: 500 [IU]
  Filled 2014-04-17: qty 5

## 2014-04-17 NOTE — Patient Instructions (Signed)
Bortezomib injection What is this medicine? BORTEZOMIB (bor TEZ oh mib) is a chemotherapy drug. It slows the growth of cancer cells. This medicine is used to treat multiple myeloma, and certain lymphomas, such as mantle-cell lymphoma. This medicine may be used for other purposes; ask your health care provider or pharmacist if you have questions. COMMON BRAND NAME(S): Velcade What should I tell my health care provider before I take this medicine? They need to know if you have any of these conditions: -diabetes -heart disease -irregular heartbeat -liver disease -on hemodialysis -low blood counts, like low white blood cells, platelets, or hemoglobin -peripheral neuropathy -taking medicine for blood pressure -an unusual or allergic reaction to bortezomib, mannitol, boron, other medicines, foods, dyes, or preservatives -pregnant or trying to get pregnant -breast-feeding How should I use this medicine? This medicine is for injection into a vein or for injection under the skin. It is given by a health care professional in a hospital or clinic setting. Talk to your pediatrician regarding the use of this medicine in children. Special care may be needed. Overdosage: If you think you have taken too much of this medicine contact a poison control center or emergency room at once. NOTE: This medicine is only for you. Do not share this medicine with others. What if I miss a dose? It is important not to miss your dose. Call your doctor or health care professional if you are unable to keep an appointment. What may interact with this medicine? This medicine may interact with the following medications: -ketoconazole -rifampin -ritonavir -St. John's Wort This list may not describe all possible interactions. Give your health care provider a list of all the medicines, herbs, non-prescription drugs, or dietary supplements you use. Also tell them if you smoke, drink alcohol, or use illegal drugs. Some items  may interact with your medicine. What should I watch for while using this medicine? Visit your doctor for checks on your progress. This drug may make you feel generally unwell. This is not uncommon, as chemotherapy can affect healthy cells as well as cancer cells. Report any side effects. Continue your course of treatment even though you feel ill unless your doctor tells you to stop. You may get drowsy or dizzy. Do not drive, use machinery, or do anything that needs mental alertness until you know how this medicine affects you. Do not stand or sit up quickly, especially if you are an older patient. This reduces the risk of dizzy or fainting spells. In some cases, you may be given additional medicines to help with side effects. Follow all directions for their use. Call your doctor or health care professional for advice if you get a fever, chills or sore throat, or other symptoms of a cold or flu. Do not treat yourself. This drug decreases your body's ability to fight infections. Try to avoid being around people who are sick. This medicine may increase your risk to bruise or bleed. Call your doctor or health care professional if you notice any unusual bleeding. You may need blood work done while you are taking this medicine. In some patients, this medicine may cause a serious brain infection that may cause death. If you have any problems seeing, thinking, speaking, walking, or standing, tell your doctor right away. If you cannot reach your doctor, urgently seek other source of medical care. Do not become pregnant while taking this medicine. Women should inform their doctor if they wish to become pregnant or think they might be pregnant. There is   a potential for serious side effects to an unborn child. Talk to your health care professional or pharmacist for more information. Do not breast-feed an infant while taking this medicine. Check with your doctor or health care professional if you get an attack of  severe diarrhea, nausea and vomiting, or if you sweat a lot. The loss of too much body fluid can make it dangerous for you to take this medicine. What side effects may I notice from receiving this medicine? Side effects that you should report to your doctor or health care professional as soon as possible: -allergic reactions like skin rash, itching or hives, swelling of the face, lips, or tongue -breathing problems -changes in hearing -changes in vision -fast, irregular heartbeat -feeling faint or lightheaded, falls -pain, tingling, numbness in the hands or feet -right upper belly pain -seizures -swelling of the ankles, feet, hands -unusual bleeding or bruising -unusually weak or tired -vomiting -yellowing of the eyes or skin Side effects that usually do not require medical attention (report to your doctor or health care professional if they continue or are bothersome): -changes in emotions or moods -constipation -diarrhea -loss of appetite -headache -irritation at site where injected -nausea This list may not describe all possible side effects. Call your doctor for medical advice about side effects. You may report side effects to FDA at 1-800-FDA-1088. Where should I keep my medicine? This drug is given in a hospital or clinic and will not be stored at home. NOTE: This sheet is a summary. It may not cover all possible information. If you have questions about this medicine, talk to your doctor, pharmacist, or health care provider.  2015, Elsevier/Gold Standard. (2013-07-22 12:46:32)  

## 2014-04-22 ENCOUNTER — Other Ambulatory Visit: Payer: Self-pay | Admitting: *Deleted

## 2014-04-22 DIAGNOSIS — C9000 Multiple myeloma not having achieved remission: Secondary | ICD-10-CM

## 2014-04-24 ENCOUNTER — Ambulatory Visit: Payer: Self-pay

## 2014-04-24 ENCOUNTER — Ambulatory Visit (HOSPITAL_BASED_OUTPATIENT_CLINIC_OR_DEPARTMENT_OTHER): Payer: 59 | Admitting: Family

## 2014-04-24 ENCOUNTER — Ambulatory Visit: Payer: Self-pay | Admitting: Family

## 2014-04-24 ENCOUNTER — Ambulatory Visit (HOSPITAL_BASED_OUTPATIENT_CLINIC_OR_DEPARTMENT_OTHER): Payer: 59

## 2014-04-24 ENCOUNTER — Encounter: Payer: Self-pay | Admitting: Family

## 2014-04-24 ENCOUNTER — Other Ambulatory Visit (HOSPITAL_BASED_OUTPATIENT_CLINIC_OR_DEPARTMENT_OTHER): Payer: 59 | Admitting: Lab

## 2014-04-24 ENCOUNTER — Other Ambulatory Visit: Payer: Self-pay | Admitting: Lab

## 2014-04-24 VITALS — BP 147/92 | HR 85 | Temp 97.6°F | Resp 16 | Ht 62.0 in | Wt 177.0 lb

## 2014-04-24 DIAGNOSIS — B20 Human immunodeficiency virus [HIV] disease: Secondary | ICD-10-CM

## 2014-04-24 DIAGNOSIS — C9000 Multiple myeloma not having achieved remission: Secondary | ICD-10-CM

## 2014-04-24 DIAGNOSIS — Z5112 Encounter for antineoplastic immunotherapy: Secondary | ICD-10-CM

## 2014-04-24 LAB — CMP (CANCER CENTER ONLY)
ALT(SGPT): 15 U/L (ref 10–47)
AST: 22 U/L (ref 11–38)
Albumin: 3.4 g/dL (ref 3.3–5.5)
Alkaline Phosphatase: 89 U/L — ABNORMAL HIGH (ref 26–84)
BILIRUBIN TOTAL: 0.7 mg/dL (ref 0.20–1.60)
BUN: 20 mg/dL (ref 7–22)
CO2: 23 mEq/L (ref 18–33)
CREATININE: 1.1 mg/dL (ref 0.6–1.2)
Calcium: 9.4 mg/dL (ref 8.0–10.3)
Chloride: 110 mEq/L — ABNORMAL HIGH (ref 98–108)
Glucose, Bld: 113 mg/dL (ref 73–118)
Potassium: 3.4 mEq/L (ref 3.3–4.7)
Sodium: 143 mEq/L (ref 128–145)
Total Protein: 7.7 g/dL (ref 6.4–8.1)

## 2014-04-24 LAB — CBC WITH DIFFERENTIAL (CANCER CENTER ONLY)
BASO#: 0 10*3/uL (ref 0.0–0.2)
BASO%: 0.2 % (ref 0.0–2.0)
EOS%: 1 % (ref 0.0–7.0)
Eosinophils Absolute: 0.1 10*3/uL (ref 0.0–0.5)
HEMATOCRIT: 39.6 % (ref 34.8–46.6)
HGB: 13.5 g/dL (ref 11.6–15.9)
LYMPH#: 2.4 10*3/uL (ref 0.9–3.3)
LYMPH%: 47 % (ref 14.0–48.0)
MCH: 34.6 pg — ABNORMAL HIGH (ref 26.0–34.0)
MCHC: 34.1 g/dL (ref 32.0–36.0)
MCV: 102 fL — AB (ref 81–101)
MONO#: 0.6 10*3/uL (ref 0.1–0.9)
MONO%: 11.2 % (ref 0.0–13.0)
NEUT#: 2.1 10*3/uL (ref 1.5–6.5)
NEUT%: 40.6 % (ref 39.6–80.0)
Platelets: 212 10*3/uL (ref 145–400)
RBC: 3.9 10*6/uL (ref 3.70–5.32)
RDW: 13.6 % (ref 11.1–15.7)
WBC: 5.1 10*3/uL (ref 3.9–10.0)

## 2014-04-24 MED ORDER — ONDANSETRON HCL 8 MG PO TABS
ORAL_TABLET | ORAL | Status: AC
  Filled 2014-04-24: qty 1

## 2014-04-24 MED ORDER — BORTEZOMIB CHEMO SQ INJECTION 3.5 MG (2.5MG/ML)
1.3000 mg/m2 | Freq: Once | INTRAMUSCULAR | Status: AC
  Administered 2014-04-24: 2.5 mg via SUBCUTANEOUS
  Filled 2014-04-24: qty 2.5

## 2014-04-24 MED ORDER — ONDANSETRON HCL 8 MG PO TABS
8.0000 mg | ORAL_TABLET | Freq: Once | ORAL | Status: AC
  Administered 2014-04-24: 8 mg via ORAL

## 2014-04-24 MED ORDER — SODIUM CHLORIDE 0.9 % IJ SOLN
10.0000 mL | INTRAMUSCULAR | Status: DC | PRN
  Administered 2014-04-24: 10 mL via INTRAVENOUS
  Filled 2014-04-24: qty 10

## 2014-04-24 MED ORDER — HEPARIN SOD (PORK) LOCK FLUSH 100 UNIT/ML IV SOLN
500.0000 [IU] | Freq: Once | INTRAVENOUS | Status: AC
  Administered 2014-04-24: 500 [IU] via INTRAVENOUS
  Filled 2014-04-24: qty 5

## 2014-04-24 MED ORDER — SODIUM CHLORIDE 0.9 % IV SOLN: Freq: Once | INTRAVENOUS | Status: DC

## 2014-04-24 NOTE — Progress Notes (Signed)
[facility]  Telephone:([phone]) [phone] [phone]  ID: Jennifer Boyle OB: [birth year] MR#: [id number]#:[id number] Patient Care Team: Philis Fendt, MD as PCP - General (Internal Medicine) Clayborne Dana. Posey Pronto, MD (Nephrology) Heath Lark, MD as Consulting Physician (Hematology and Oncology) Heath Lark, MD as Consulting Physician (Hematology and Oncology) Clayborne Dana. Posey Pronto, MD as Consulting Physician (Nephrology) Truman Hayward, MD as Consulting Physician (Infectious Diseases) Inez Pilgrim, MD as Consulting Physician (Oncology)  DIAGNOSIS: IgG kappa myeloma  HIV-positive  INTERVAL HISTORY: Jennifer Boyle is a very pleasant African American female here alone today for a 1 month follow-up. She had been seen at the main [place]. However, she lives and works out in our area. She has myeloma and also HIV. 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  She is being seen in [facility] and is getting ready for a bone marrow transplant. This would be a stem cell transplant.  Her tentative schedule is as follow: July 29 central line placement July 30 Epogen August 3 labs and stem cell harvest  *If they are unable to harvest enough stem cells on the 3rd they will harvest again on the 4th and 5th if needed. Her sister plans to come down from [place] to help take care of her during this time.  She had a bone marrow biopsy done at [facility]. It appears she had a good response. I think she may have had 5% plasma cells. She still has the complex cytogenetics. She is hyperdiploid.  She had improve we on Revlimid. She has been off this since April. She's currently on Velcade weekly and Zometa every 3 weeks. She states that she feels good. Her appetite is good. She denies fever, chills, cough, rash, headache, dizziness, blurred vision, SOB, chest pain, palpitations, abdominal pain, diarrhea, problems urinating, blood in urine or stool. She denies swelling,  tenderness, numbness or tingling in her extremities. She does get some constipation since starting on Velcade. This is controled with Colace and Miralax. There is no issues with the HIV. She is on the typical "cocktail" for this.  CURRENT TREATMENT: Velcade every week  Zometa 4 mg IV every 3 weeks  REVIEW OF SYSTEMS: All other 10 point review of systems is negative except for those issues mentioned above.   PAST MEDICAL HISTORY: Past Medical History  Diagnosis Date  . Pneumonia 10/2002; 2010    Required INTUBATION for streptococcal pneumonia/septicemia (10/2002)  . HIV disease DX: 01/2002  . Fibroid uterus   . 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 Peripheral neuropathy     resolved.   . Multiple myeloma 01/2013    01/11/2013 cytogenetics showed 59, XX, +2, +4, +5, +7, add(7)(p22), +, +9, +11, der(11) t(1;11)(q11; q24), +12, +15, -16, +17, +19, +20, +mar1, +mar2[cp6] / 53, XX [17].   Myeloma FISH showed presence of +11, and +17.    PAST SURGICAL HISTORY: Past Surgical History  Procedure Laterality Date  . No past surgeries     FAMILY HISTORY Family History  Problem Relation Age of Onset  . Hypertension Mother    GYNECOLOGIC HISTORY:  Patient's last menstrual period was 01/04/2013.  SOCIAL HISTORY:  History   Social History  . Marital Status: Single    Spouse Name: N/A    Number of Children: 0  . Years of Education: N/A   Occupational History  .      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    Social History Main Topics  . Smoking status: Never Smoker   . Smokeless tobacco: Never Used     Comment: never used tobacco  . Alcohol Use: No  . Drug Use: No  . Sexual Activity: Not Currently    Other Topics Concern  . Not on file   Social History Narrative   The patient is single. The patient has no children.   The patient is originally from [place] then moved to the [place] approximately 25 years ago.   The patient works as a Multimedia programmer Investment banker, corporate).   Patient has never smoked. The patient does not use alcohol.    The patient has never used smokeless tobacco. The patient denies IV drug abuse.   ADVANCED DIRECTIVES: <no information>  HEALTH MAINTENANCE: History  Substance Use Topics  . Smoking status: Never Smoker   . Smokeless tobacco: Never Used     Comment: never used tobacco  . Alcohol Use: No   Colonoscopy: PAP: Bone density: Lipid panel:  Allergies  Allergen Reactions  . Aspirin     Upset stomach   . Efavirenz Hives and Rash    (brand name sustiva)   Current Outpatient Prescriptions  Medication Sig Dispense Refill  . Abacavir-Dolutegravir-Lamivud (TRIUMEQ) 600-50-300 MG TABS Take 1 tablet by mouth daily.  30 tablet  11  . acyclovir (ZOVIRAX) 400 MG tablet Take 0.5 tablets (200 mg total) by mouth daily.  60 tablet  3  . aspirin 81 MG tablet Take 81 mg by mouth daily.      . calcium carbonate (OS-CAL) 600 MG TABS tablet Take 600 mg by mouth daily with breakfast.      . cholecalciferol (VITAMIN D) 1000 UNITS tablet Take 1 tablet (1,000 Units total) by mouth daily.  30 tablet  3  . docusate sodium (COLACE) 100 MG capsule Take 100 mg by mouth 2 (two) times daily.      [name] lidocaine-prilocaine (EMLA) cream Apply topically as needed. Apply to portacath one hour before chemo access or blood draw.  30 g  3  . polyethylene glycol (MIRALAX / GLYCOLAX) packet Take 17 g by mouth daily.      . prochlorperazine (COMPAZINE) 10 MG tablet Take 1 tablet (10 mg total) by mouth every 6 (six) hours as needed (Nausea or vomiting).  30 tablet  1  . promethazine (PHENERGAN) 25 MG tablet Take 1 tablet (25 mg total) by mouth every 6 (six) hours as needed for nausea.  30 tablet  0   . sodium bicarbonate 650 MG tablet Take 1 tablet (650 mg total) by mouth 3 (three) times daily.  90 tablet  3   No current facility-administered medications for this visit.   OBJECTIVE: Filed Vitals:   04/24/14 1336  BP: 147/92  Pulse: 85  Temp: 97.6 F (36.4 C)  Resp: 16   Body mass index is 32.37 kg/(m^2). ECOG FS:0 - Asymptomatic Ocular: Sclerae unicteric, pupils equal, round and reactive to light Ear-nose-throat: Oropharynx clear, dentition fair Lymphatic: No cervical or supraclavicular adenopathy Lungs no rales or rhonchi, good excursion bilaterally Heart regular rate and rhythm, no murmur appreciated Abd soft, nontender, positive bowel sounds MSK no focal spinal tenderness, no joint  edema Neuro: non-focal, well-oriented, appropriate affect Breasts: Deferred  LAB RESULTS: CMP     Component Value Date/Time   NA 141 04/10/2014 1331   NA 141 04/04/2014 0852   NA 141 03/07/2014 1502   K 3.6 04/10/2014 1331   K 4.2 04/04/2014 0852   K 3.9 03/07/2014 1502   CL 102 04/10/2014 1331   CL 106 03/07/2014 1502   CL 111* 03/29/2013 1351   CO2 25 04/10/2014 1331   CO2 22 04/04/2014 0852   CO2 22 03/07/2014 1502   GLUCOSE 109 04/10/2014 1331   GLUCOSE 118 04/04/2014 0852   GLUCOSE 121* 03/07/2014 1502   GLUCOSE 129* 03/29/2013 1351   BUN 20 04/10/2014 1331   BUN 20.9 04/04/2014 0852   BUN 19 03/07/2014 1502   CREATININE 1.1 04/10/2014 1331   CREATININE 1.2* 04/04/2014 0852   CREATININE 1.02 03/07/2014 1502   CALCIUM 9.0 04/10/2014 1331   CALCIUM 9.4 04/04/2014 0852   CALCIUM 9.2 03/07/2014 1502   PROT 7.2 04/10/2014 1331   PROT 7.2 04/04/2014 0852   PROT 6.8 03/07/2014 1502   ALBUMIN 3.7 04/04/2014 0852   ALBUMIN 3.3* 03/07/2014 1502   AST 26 04/10/2014 1331   AST 20 04/04/2014 0852   AST 22 03/07/2014 1502   ALT 17 04/10/2014 1331   ALT 17 04/04/2014 0852   ALT 16 03/07/2014 1502   ALKPHOS 80 04/10/2014 1331   ALKPHOS 94 04/04/2014 0852   ALKPHOS 91 03/07/2014 1502   BILITOT 0.60 04/10/2014 1331   BILITOT  0.23 04/04/2014 0852   BILITOT 0.2* 03/07/2014 1502   GFRNONAA 67 05/02/2013 1412   GFRNONAA 12* 02/04/2013 0850   GFRAA 77 05/02/2013 1412   GFRAA 14* 02/04/2013 0850   No results found for this basename: SPEP, UPEP,  kappa and lambda light chains   Lab Results  Component Value Date   WBC 7.1 04/17/2014   NEUTROABS 2.9 04/17/2014   HGB 12.2 04/17/2014   HCT 36.2 04/17/2014   MCV 102* 04/17/2014   PLT 208 04/17/2014   No results found for this basename: LABCA2   No components found with this basename: AQTMA263   No results found for this basename: INR,  in the last 168 hours  STUDIES: No results found.  ASSESSMENT/PLAN: Jennifer Boyle is 48 year old African American female who is HIV positive. She has IgG kappa myeloma.  She seems to be doing very well. This has to be a good indicator that she has responded nicely to treatment. Dr. Alvy Bimler did a fantastic job with that getting her myeloma under good control. She is asymptomatic at this time. She will begin preparation for her stem cell harvest and transplant on July 29th (as stated above).   We will proceed with Day 1 Cycle 2 of Velcade today as planned. This will be her last treatment for now. We will re-evaluate her plan after her transplant.   We discussed her CBC, which was negative, in detail.  We will wait to hear from her team at [facility] post transplant and schedule her next follow-up appointment at that time.   Eliezer Bottom, NP 04/24/2014 1:48 PM

## 2014-04-24 NOTE — Patient Instructions (Signed)
Bortezomib injection What is this medicine? BORTEZOMIB (bor TEZ oh mib) is a chemotherapy drug. It slows the growth of cancer cells. This medicine is used to treat multiple myeloma, and certain lymphomas, such as mantle-cell lymphoma. This medicine may be used for other purposes; ask your health care provider or pharmacist if you have questions. COMMON BRAND NAME(S): Velcade What should I tell my health care provider before I take this medicine? They need to know if you have any of these conditions: -diabetes -heart disease -irregular heartbeat -liver disease -on hemodialysis -low blood counts, like low white blood cells, platelets, or hemoglobin -peripheral neuropathy -taking medicine for blood pressure -an unusual or allergic reaction to bortezomib, mannitol, boron, other medicines, foods, dyes, or preservatives -pregnant or trying to get pregnant -breast-feeding How should I use this medicine? This medicine is for injection into a vein or for injection under the skin. It is given by a health care professional in a hospital or clinic setting. Talk to your pediatrician regarding the use of this medicine in children. Special care may be needed. Overdosage: If you think you have taken too much of this medicine contact a poison control center or emergency room at once. NOTE: This medicine is only for you. Do not share this medicine with others. What if I miss a dose? It is important not to miss your dose. Call your doctor or health care professional if you are unable to keep an appointment. What may interact with this medicine? This medicine may interact with the following medications: -ketoconazole -rifampin -ritonavir -St. John's Wort This list may not describe all possible interactions. Give your health care provider a list of all the medicines, herbs, non-prescription drugs, or dietary supplements you use. Also tell them if you smoke, drink alcohol, or use illegal drugs. Some items  may interact with your medicine. What should I watch for while using this medicine? Visit your doctor for checks on your progress. This drug may make you feel generally unwell. This is not uncommon, as chemotherapy can affect healthy cells as well as cancer cells. Report any side effects. Continue your course of treatment even though you feel ill unless your doctor tells you to stop. You may get drowsy or dizzy. Do not drive, use machinery, or do anything that needs mental alertness until you know how this medicine affects you. Do not stand or sit up quickly, especially if you are an older patient. This reduces the risk of dizzy or fainting spells. In some cases, you may be given additional medicines to help with side effects. Follow all directions for their use. Call your doctor or health care professional for advice if you get a fever, chills or sore throat, or other symptoms of a cold or flu. Do not treat yourself. This drug decreases your body's ability to fight infections. Try to avoid being around people who are sick. This medicine may increase your risk to bruise or bleed. Call your doctor or health care professional if you notice any unusual bleeding. You may need blood work done while you are taking this medicine. In some patients, this medicine may cause a serious brain infection that may cause death. If you have any problems seeing, thinking, speaking, walking, or standing, tell your doctor right away. If you cannot reach your doctor, urgently seek other source of medical care. Do not become pregnant while taking this medicine. Women should inform their doctor if they wish to become pregnant or think they might be pregnant. There is   a potential for serious side effects to an unborn child. Talk to your health care professional or pharmacist for more information. Do not breast-feed an infant while taking this medicine. Check with your doctor or health care professional if you get an attack of  severe diarrhea, nausea and vomiting, or if you sweat a lot. The loss of too much body fluid can make it dangerous for you to take this medicine. What side effects may I notice from receiving this medicine? Side effects that you should report to your doctor or health care professional as soon as possible: -allergic reactions like skin rash, itching or hives, swelling of the face, lips, or tongue -breathing problems -changes in hearing -changes in vision -fast, irregular heartbeat -feeling faint or lightheaded, falls -pain, tingling, numbness in the hands or feet -right upper belly pain -seizures -swelling of the ankles, feet, hands -unusual bleeding or bruising -unusually weak or tired -vomiting -yellowing of the eyes or skin Side effects that usually do not require medical attention (report to your doctor or health care professional if they continue or are bothersome): -changes in emotions or moods -constipation -diarrhea -loss of appetite -headache -irritation at site where injected -nausea This list may not describe all possible side effects. Call your doctor for medical advice about side effects. You may report side effects to FDA at 1-800-FDA-1088. Where should I keep my medicine? This drug is given in a hospital or clinic and will not be stored at home. NOTE: This sheet is a summary. It may not cover all possible information. If you have questions about this medicine, talk to your doctor, pharmacist, or health care provider.  2015, Elsevier/Gold Standard. (2013-07-22 12:46:32)  

## 2014-05-01 ENCOUNTER — Ambulatory Visit: Payer: Self-pay | Admitting: Family

## 2014-05-01 ENCOUNTER — Other Ambulatory Visit: Payer: Self-pay | Admitting: Lab

## 2014-05-01 ENCOUNTER — Ambulatory Visit: Payer: Self-pay

## 2014-05-22 DIAGNOSIS — Z949 Transplanted organ and tissue status, unspecified: Secondary | ICD-10-CM | POA: Insufficient documentation

## 2014-06-06 ENCOUNTER — Telehealth: Payer: Self-pay | Admitting: Hematology & Oncology

## 2014-06-06 ENCOUNTER — Other Ambulatory Visit: Payer: Self-pay

## 2014-06-06 NOTE — Telephone Encounter (Signed)
Rhonda at [facility] aware of pt's appointment on 9-15. She will give pt schedule. [phone])

## 2014-06-18 ENCOUNTER — Encounter: Payer: Self-pay | Admitting: Infectious Disease

## 2014-06-24 ENCOUNTER — Other Ambulatory Visit: Payer: Self-pay | Admitting: Lab

## 2014-06-24 ENCOUNTER — Other Ambulatory Visit: Payer: Self-pay | Admitting: *Deleted

## 2014-06-24 ENCOUNTER — Ambulatory Visit: Payer: Self-pay | Admitting: Hematology & Oncology

## 2014-06-25 ENCOUNTER — Other Ambulatory Visit: Payer: Self-pay | Admitting: Oncology

## 2014-06-25 ENCOUNTER — Other Ambulatory Visit (HOSPITAL_COMMUNITY)
Admission: RE | Admit: 2014-06-25 | Discharge: 2014-06-25 | Disposition: A | Discharge status: Home / Self Care | Payer: 59 | Source: Ambulatory Visit | Attending: Hematology & Oncology | Admitting: Hematology & Oncology

## 2014-06-25 ENCOUNTER — Other Ambulatory Visit (HOSPITAL_BASED_OUTPATIENT_CLINIC_OR_DEPARTMENT_OTHER): Payer: 59 | Admitting: Lab

## 2014-06-25 ENCOUNTER — Encounter: Payer: Self-pay | Admitting: *Deleted

## 2014-06-25 ENCOUNTER — Ambulatory Visit (HOSPITAL_BASED_OUTPATIENT_CLINIC_OR_DEPARTMENT_OTHER): Payer: 59 | Admitting: Hematology & Oncology

## 2014-06-25 ENCOUNTER — Encounter: Payer: Self-pay | Admitting: Hematology & Oncology

## 2014-06-25 VITALS — BP 138/83 | HR 85 | Temp 97.4°F | Resp 14 | Ht 62.0 in | Wt 179.0 lb

## 2014-06-25 DIAGNOSIS — C9000 Multiple myeloma not having achieved remission: Secondary | ICD-10-CM | POA: Insufficient documentation

## 2014-06-25 DIAGNOSIS — C9001 Multiple myeloma in remission: Secondary | ICD-10-CM

## 2014-06-25 DIAGNOSIS — Z9484 Stem cells transplant status: Secondary | ICD-10-CM | POA: Diagnosis not present

## 2014-06-25 DIAGNOSIS — Z21 Asymptomatic human immunodeficiency virus [HIV] infection status: Secondary | ICD-10-CM | POA: Diagnosis not present

## 2014-06-25 LAB — COMPREHENSIVE METABOLIC PANEL
ALT: 19 U/L (ref 0–35)
AST: 20 U/L (ref 0–37)
Albumin: 3.7 g/dL (ref 3.5–5.2)
Alkaline Phosphatase: 118 U/L — ABNORMAL HIGH (ref 39–117)
BUN: 12 mg/dL (ref 6–23)
CO2: 22 meq/L (ref 19–32)
Calcium: 8.7 mg/dL (ref 8.4–10.5)
Chloride: 110 mEq/L (ref 96–112)
Creatinine, Ser: 0.87 mg/dL (ref 0.50–1.10)
GLUCOSE: 110 mg/dL — AB (ref 70–99)
Potassium: 4.1 mEq/L (ref 3.5–5.3)
Sodium: 142 mEq/L (ref 135–145)
TOTAL PROTEIN: 6.1 g/dL (ref 6.0–8.3)
Total Bilirubin: 0.3 mg/dL (ref 0.2–1.2)

## 2014-06-25 LAB — CBC WITH DIFFERENTIAL (CANCER CENTER ONLY)
BASO#: 0 10*3/uL (ref 0.0–0.2)
BASO%: 0.2 % (ref 0.0–2.0)
EOS%: 1.4 % (ref 0.0–7.0)
Eosinophils Absolute: 0.1 10*3/uL (ref 0.0–0.5)
HEMATOCRIT: 33.8 % — AB (ref 34.8–46.6)
HGB: 11.5 g/dL — ABNORMAL LOW (ref 11.6–15.9)
LYMPH#: 3.3 10*3/uL (ref 0.9–3.3)
LYMPH%: 38.1 % (ref 14.0–48.0)
MCH: 34.6 pg — AB (ref 26.0–34.0)
MCHC: 34 g/dL (ref 32.0–36.0)
MCV: 102 fL — ABNORMAL HIGH (ref 81–101)
MONO#: 0.8 10*3/uL (ref 0.1–0.9)
MONO%: 8.6 % (ref 0.0–13.0)
NEUT#: 4.5 10*3/uL (ref 1.5–6.5)
NEUT%: 51.7 % (ref 39.6–80.0)
Platelets: 167 10*3/uL (ref 145–400)
RBC: 3.32 10*6/uL — ABNORMAL LOW (ref 3.70–5.32)
RDW: 15.2 % (ref 11.1–15.7)
WBC: 8.7 10*3/uL (ref 3.9–10.0)

## 2014-06-25 MED ORDER — SULFAMETHOXAZOLE-TMP DS 800-160 MG PO TABS: 1.0000 | ORAL_TABLET | Freq: Every day | ORAL | Status: DC

## 2014-06-25 NOTE — Progress Notes (Signed)
Hematology and Oncology Follow Up Visit  Reizy Dunlow [id number] Mar 19, 1966 48 y.o. 06/25/2014   Principle Diagnosis:   IgG kappa myeloma   HIV-positive  Current Therapy:    S/p BMT - Autologous at [facility] in 05/2014     Interim History:  Ms.  Milius is back for followup. She did undergo her autologous stem cell for the myeloma. She did really well with this. She was treated at [facility]. She received melphalan for her conditioning regimen.  There was some issues I think with respect to the HIV. Am I sure what her CD4 count was. She wasn't placed on azithromycin which he really cannot tolerate. She also is on Bactrim. She is doing better with the Bactrim. We are checking her CD4 count. If it is greater than 50, we can stop the Zithromax.  She's not had any diarrhea. There's been no nausea vomiting. She's had no headache. She's had no fever. There's been no bleeding.  Currently, her performance status is ECOG 1.  Medications: Current outpatient prescriptions:Abacavir-Dolutegravir-Lamivud 600-50-300 MG TABS, Take by mouth every morning., Disp: , Rfl: ;  azithromycin (ZITHROMAX) 600 MG tablet, Take 1,200 mg by mouth., Disp: , Rfl: ;  calcium carbonate (OS-CAL) 600 MG TABS tablet, Take 600 mg by mouth daily with breakfast., Disp: , Rfl: ;  cholecalciferol (VITAMIN D) 1000 UNITS tablet, Take 1 tablet (1,000 Units total) by mouth daily., Disp: 30 tablet, Rfl: 3 docusate sodium (COLACE) 100 MG capsule, Take 100 mg by mouth 2 (two) times daily., Disp: , Rfl: ;  lidocaine-prilocaine (EMLA) cream, Apply topically as needed. Apply to portacath one hour before chemo access or blood draw., Disp: 30 g, Rfl: 3;  ondansetron (ZOFRAN) 8 MG tablet, Take 8 mg by mouth., Disp: , Rfl: ;  polyethylene glycol (MIRALAX / GLYCOLAX) packet, Take 17 g by mouth daily., Disp: , Rfl:  prochlorperazine (COMPAZINE) 10 MG tablet, Take 1 tablet (10 mg total) by mouth every 6 (six) hours as needed (Nausea or vomiting).,  Disp: 30 tablet, Rfl: 1;  promethazine (PHENERGAN) 25 MG tablet, Take 1 tablet (25 mg total) by mouth every 6 (six) hours as needed for nausea., Disp: 30 tablet, Rfl: 0 sulfamethoxazole-trimethoprim (BACTRIM DS) 800-160 MG per tablet, Take 1 tablet by mouth daily. Take one tablet by mouth two times daily on Monday and Thursday. Do not start taking until told to start by clinic., Disp: 30 tablet, Rfl: 2;  TraMADol HCl 50 MG TBDP, Take by mouth every 6 (six) hours as needed., Disp: , Rfl: ;  valACYclovir (VALTREX) 500 MG tablet, Take 500 mg by mouth., Disp: , Rfl:   Allergies:  Allergies  Allergen Reactions  . Aspirin     Upset stomach   . Efavirenz Hives and Rash    (brand name sustiva)    Past Medical History, Surgical history, Social history, and Family History were reviewed and updated.  Review of Systems: As above  Physical Exam:  height is 5\' 2"  (1.575 m) and weight is 179 lb (81.194 kg). Her oral temperature is 97.4 F (36.3 C). Her blood pressure is 138/83 and her pulse is 85. Her respiration is 14.   [name]. Head and exam shows no ocular or oral lesions. There are no palpable cervical or supraclavicular lymph nodes. Lungs are clear. Cardiac exam regular in rhythm with no murmurs rubs or bruits. Abdomen soft. She has good bowel sounds. There is no fluid wave. There is no palpable liver or spleen  tip. Back exam no tenderness over the spine, ribs or hips. Extremities shows no clubbing, cyanosis or edema. Neurological exam shows no focal neurological deficits. Skin exam no rashes, ecchymosis or petechia.  Lab Results  Component Value Date   WBC 8.7 06/25/2014   HGB 11.5* 06/25/2014   HCT 33.8* 06/25/2014   MCV 102* 06/25/2014   PLT 167 06/25/2014     Chemistry      Component Value Date/Time   NA 143 04/24/2014 1317   NA 139 04/17/2014 0834   NA 141 04/04/2014 0852   K 3.4 04/24/2014 1317   K 4.1 04/17/2014 0834   K 4.2 04/04/2014 0852   CL 110*  04/24/2014 1317   CL 105 04/17/2014 0834   CL 111* 03/29/2013 1351   CO2 23 04/24/2014 1317   CO2 20 04/17/2014 0834   CO2 22 04/04/2014 0852   BUN 20 04/24/2014 1317   BUN 20 04/17/2014 0834   BUN 20.9 04/04/2014 0852   CREATININE 1.1 04/24/2014 1317   CREATININE 1.08 04/17/2014 0834   CREATININE 1.2* 04/04/2014 0852      Component Value Date/Time   CALCIUM 9.4 04/24/2014 1317   CALCIUM 9.2 04/17/2014 0834   CALCIUM 9.4 04/04/2014 0852   ALKPHOS 89* 04/24/2014 1317   ALKPHOS 88 04/17/2014 0834   ALKPHOS 94 04/04/2014 0852   AST 22 04/24/2014 1317   AST 23 04/17/2014 0834   AST 20 04/04/2014 0852   ALT 15 04/24/2014 1317   ALT 17 04/17/2014 0834   ALT 17 04/04/2014 0852   BILITOT 0.70 04/24/2014 1317   BILITOT 0.3 04/17/2014 0834   BILITOT 0.23 04/04/2014 0852         Impression and Plan: Ms. Mcshea is 48 year old African [place] female. She is IgG kappa myeloma. She ultimately underwent a stem cell transplant. This was back on August 14.  We will see what her labs look like. Unfortunately, none of the myeloma studies were ordered. I'll have to do this when we get her back to see her.  She may need some type of maintenance therapy. Again, I don't know she did not respond to Revlimid initially. We may want to consider maintenance Velcade although there is some neuropathy issues. We also might want to consider maintenance Pomalidomide.  We will see what the HIV status is would respect her CD4 count. She has never been cinematic with the HIV.  We will recheck her blood counts every couple weeks.  I will see her back in one month.  Volanda Napoleon, MD 9/16/20151:51 PM

## 2014-06-25 NOTE — Progress Notes (Signed)
Spoke with Blood bank to let them know that patient is only to get irradiated blood products and leukoreduced products because of transplant

## 2014-06-26 LAB — T-HELPER CELLS (CD4) COUNT (NOT AT ARMC)
CD4 % Helper T Cell: 12 % — ABNORMAL LOW (ref 33–55)
CD4 T Cell Abs: 400 /uL (ref 400–2700)

## 2014-06-27 ENCOUNTER — Encounter: Payer: Self-pay | Admitting: *Deleted

## 2014-06-30 ENCOUNTER — Other Ambulatory Visit: Payer: Self-pay | Admitting: *Deleted

## 2014-06-30 DIAGNOSIS — C9001 Multiple myeloma in remission: Secondary | ICD-10-CM

## 2014-06-30 DIAGNOSIS — B20 Human immunodeficiency virus [HIV] disease: Secondary | ICD-10-CM

## 2014-06-30 MED ORDER — ABACAVIR-DOLUTEGRAVIR-LAMIVUD 600-50-300 MG PO TABS: 1.0000 | ORAL_TABLET | Freq: Every morning | ORAL | Status: DC

## 2014-06-30 NOTE — Telephone Encounter (Signed)
ADAP Application 

## 2014-07-01 LAB — FLOW CYTOMETRY - CHCC SATELLITE

## 2014-07-03 ENCOUNTER — Other Ambulatory Visit: Payer: Self-pay | Admitting: Nurse Practitioner

## 2014-07-03 DIAGNOSIS — C9001 Multiple myeloma in remission: Secondary | ICD-10-CM

## 2014-07-03 MED ORDER — VALACYCLOVIR HCL 500 MG PO TABS: 500.0000 mg | ORAL_TABLET | Freq: Every day | ORAL | Status: DC

## 2014-07-10 ENCOUNTER — Encounter: Payer: Self-pay | Admitting: *Deleted

## 2014-07-11 ENCOUNTER — Other Ambulatory Visit (HOSPITAL_BASED_OUTPATIENT_CLINIC_OR_DEPARTMENT_OTHER): Payer: 59 | Admitting: Lab

## 2014-07-11 ENCOUNTER — Ambulatory Visit: Payer: 59

## 2014-07-11 DIAGNOSIS — C9000 Multiple myeloma not having achieved remission: Secondary | ICD-10-CM

## 2014-07-11 DIAGNOSIS — C9001 Multiple myeloma in remission: Secondary | ICD-10-CM

## 2014-07-11 LAB — CBC WITH DIFFERENTIAL (CANCER CENTER ONLY)
BASO#: 0 10*3/uL (ref 0.0–0.2)
BASO%: 0.2 % (ref 0.0–2.0)
EOS ABS: 0.2 10*3/uL (ref 0.0–0.5)
EOS%: 2.4 % (ref 0.0–7.0)
HCT: 37.5 % (ref 34.8–46.6)
HGB: 12.7 g/dL (ref 11.6–15.9)
LYMPH#: 2.9 10*3/uL (ref 0.9–3.3)
LYMPH%: 45.5 % (ref 14.0–48.0)
MCH: 35.2 pg — ABNORMAL HIGH (ref 26.0–34.0)
MCHC: 33.9 g/dL (ref 32.0–36.0)
MCV: 104 fL — AB (ref 81–101)
MONO#: 0.5 10*3/uL (ref 0.1–0.9)
MONO%: 8.5 % (ref 0.0–13.0)
NEUT%: 43.4 % (ref 39.6–80.0)
NEUTROS ABS: 2.7 10*3/uL (ref 1.5–6.5)
PLATELETS: 165 10*3/uL (ref 145–400)
RBC: 3.61 10*6/uL — ABNORMAL LOW (ref 3.70–5.32)
RDW: 16.8 % — ABNORMAL HIGH (ref 11.1–15.7)
WBC: 6.3 10*3/uL (ref 3.9–10.0)

## 2014-07-11 LAB — COMPREHENSIVE METABOLIC PANEL
ALBUMIN: 4.1 g/dL (ref 3.5–5.2)
ALT: 20 U/L (ref 0–35)
AST: 22 U/L (ref 0–37)
Alkaline Phosphatase: 116 U/L (ref 39–117)
BUN: 12 mg/dL (ref 6–23)
CALCIUM: 8.9 mg/dL (ref 8.4–10.5)
CO2: 20 meq/L (ref 19–32)
Chloride: 113 mEq/L — ABNORMAL HIGH (ref 96–112)
Creatinine, Ser: 1.03 mg/dL (ref 0.50–1.10)
GLUCOSE: 109 mg/dL — AB (ref 70–99)
POTASSIUM: 4.1 meq/L (ref 3.5–5.3)
Sodium: 142 mEq/L (ref 135–145)
TOTAL PROTEIN: 6.3 g/dL (ref 6.0–8.3)
Total Bilirubin: 0.5 mg/dL (ref 0.2–1.2)

## 2014-07-11 LAB — LACTATE DEHYDROGENASE: LDH: 228 U/L (ref 94–250)

## 2014-07-11 MED ORDER — HEPARIN SOD (PORK) LOCK FLUSH 100 UNIT/ML IV SOLN
500.0000 [IU] | Freq: Once | INTRAVENOUS | Status: AC
  Administered 2014-07-11: 500 [IU] via INTRAVENOUS
  Filled 2014-07-11: qty 5

## 2014-07-11 MED ORDER — SODIUM CHLORIDE 0.9 % IJ SOLN
10.0000 mL | INTRAMUSCULAR | Status: DC | PRN
  Administered 2014-07-11: 10 mL via INTRAVENOUS
  Filled 2014-07-11: qty 10

## 2014-07-11 NOTE — Patient Instructions (Signed)

## 2014-07-16 ENCOUNTER — Other Ambulatory Visit (HOSPITAL_COMMUNITY)
Admission: RE | Admit: 2014-07-16 | Discharge: 2014-07-16 | Disposition: A | Discharge status: Home / Self Care | Payer: 59 | Source: Ambulatory Visit | Attending: Family Medicine | Admitting: Family Medicine

## 2014-07-16 ENCOUNTER — Other Ambulatory Visit: Payer: Self-pay | Admitting: Family Medicine

## 2014-07-16 DIAGNOSIS — Z124 Encounter for screening for malignant neoplasm of cervix: Secondary | ICD-10-CM | POA: Diagnosis present

## 2014-07-16 DIAGNOSIS — Z1151 Encounter for screening for human papillomavirus (HPV): Secondary | ICD-10-CM | POA: Diagnosis present

## 2014-07-17 LAB — CYTOLOGY - PAP

## 2014-07-18 ENCOUNTER — Telehealth: Payer: Self-pay | Admitting: Hematology & Oncology

## 2014-07-18 NOTE — Telephone Encounter (Signed)
Pt cx 10-13 added MD to 10-28 she said Dr. Isaias Sakai in [facility] told her to be seen in 3 weeks. I sent MD inbasket message and left RN voice mail.

## 2014-07-22 ENCOUNTER — Other Ambulatory Visit: Payer: Self-pay | Admitting: Lab

## 2014-07-22 ENCOUNTER — Other Ambulatory Visit (HOSPITAL_COMMUNITY): Payer: Self-pay | Admitting: Family Medicine

## 2014-07-22 ENCOUNTER — Ambulatory Visit: Payer: Self-pay | Admitting: Hematology & Oncology

## 2014-07-22 DIAGNOSIS — Z1231 Encounter for screening mammogram for malignant neoplasm of breast: Secondary | ICD-10-CM

## 2014-07-25 ENCOUNTER — Encounter: Payer: Self-pay | Admitting: Infectious Disease

## 2014-07-25 ENCOUNTER — Other Ambulatory Visit: Payer: Self-pay

## 2014-07-25 ENCOUNTER — Ambulatory Visit (HOSPITAL_COMMUNITY)
Admission: RE | Admit: 2014-07-25 | Discharge: 2014-07-25 | Disposition: A | Discharge status: Home / Self Care | Payer: 59 | Source: Ambulatory Visit | Attending: Family Medicine | Admitting: Family Medicine

## 2014-07-25 DIAGNOSIS — Z1231 Encounter for screening mammogram for malignant neoplasm of breast: Secondary | ICD-10-CM

## 2014-07-30 ENCOUNTER — Other Ambulatory Visit: Payer: Self-pay | Admitting: *Deleted

## 2014-07-30 DIAGNOSIS — C9000 Multiple myeloma not having achieved remission: Secondary | ICD-10-CM

## 2014-07-30 MED ORDER — AZITHROMYCIN 600 MG PO TABS: ORAL_TABLET | ORAL | Status: DC

## 2014-08-06 ENCOUNTER — Ambulatory Visit (HOSPITAL_BASED_OUTPATIENT_CLINIC_OR_DEPARTMENT_OTHER): Payer: 59 | Admitting: Family

## 2014-08-06 ENCOUNTER — Encounter: Payer: Self-pay | Admitting: Family

## 2014-08-06 ENCOUNTER — Other Ambulatory Visit: Payer: Self-pay | Admitting: Lab

## 2014-08-06 ENCOUNTER — Other Ambulatory Visit (HOSPITAL_BASED_OUTPATIENT_CLINIC_OR_DEPARTMENT_OTHER): Payer: 59 | Admitting: Lab

## 2014-08-06 VITALS — BP 126/85 | HR 86 | Temp 98.2°F | Resp 14 | Ht 64.0 in | Wt 176.0 lb

## 2014-08-06 DIAGNOSIS — C9000 Multiple myeloma not having achieved remission: Secondary | ICD-10-CM

## 2014-08-06 DIAGNOSIS — C9001 Multiple myeloma in remission: Secondary | ICD-10-CM

## 2014-08-06 LAB — CBC WITH DIFFERENTIAL (CANCER CENTER ONLY)
BASO#: 0 10*3/uL (ref 0.0–0.2)
BASO%: 0.2 % (ref 0.0–2.0)
EOS%: 0.8 % (ref 0.0–7.0)
Eosinophils Absolute: 0 10*3/uL (ref 0.0–0.5)
HEMATOCRIT: 35.8 % (ref 34.8–46.6)
HGB: 12.4 g/dL (ref 11.6–15.9)
LYMPH#: 2.5 10*3/uL (ref 0.9–3.3)
LYMPH%: 49 % — ABNORMAL HIGH (ref 14.0–48.0)
MCH: 36.3 pg — ABNORMAL HIGH (ref 26.0–34.0)
MCHC: 34.6 g/dL (ref 32.0–36.0)
MCV: 105 fL — ABNORMAL HIGH (ref 81–101)
MONO#: 0.4 10*3/uL (ref 0.1–0.9)
MONO%: 8.8 % (ref 0.0–13.0)
NEUT#: 2.1 10*3/uL (ref 1.5–6.5)
NEUT%: 41.2 % (ref 39.6–80.0)
PLATELETS: 166 10*3/uL (ref 145–400)
RBC: 3.42 10*6/uL — ABNORMAL LOW (ref 3.70–5.32)
RDW: 14.7 % (ref 11.1–15.7)
WBC: 5 10*3/uL (ref 3.9–10.0)

## 2014-08-06 LAB — COMPREHENSIVE METABOLIC PANEL
ALBUMIN: 3.9 g/dL (ref 3.5–5.2)
ALT: 16 U/L (ref 0–35)
AST: 21 U/L (ref 0–37)
Alkaline Phosphatase: 90 U/L (ref 39–117)
BUN: 14 mg/dL (ref 6–23)
CHLORIDE: 110 meq/L (ref 96–112)
CO2: 22 meq/L (ref 19–32)
Calcium: 9 mg/dL (ref 8.4–10.5)
Creatinine, Ser: 0.91 mg/dL (ref 0.50–1.10)
Glucose, Bld: 104 mg/dL — ABNORMAL HIGH (ref 70–99)
POTASSIUM: 4 meq/L (ref 3.5–5.3)
Sodium: 142 mEq/L (ref 135–145)
TOTAL PROTEIN: 6.3 g/dL (ref 6.0–8.3)
Total Bilirubin: 0.4 mg/dL (ref 0.2–1.2)

## 2014-08-06 LAB — LACTATE DEHYDROGENASE: LDH: 211 U/L (ref 94–250)

## 2014-08-06 NOTE — Progress Notes (Signed)
[place]  [phone] [phone]  ID: [name] OB: [birth year] MR#: [id number] CSN#:[id number] Patient Care Team: Jonathon Bellows, MD as PCP - General (Family Medicine) Elmarie Shiley, MD (Nephrology) Heath Lark, MD as Consulting Physician (Hematology and Oncology) Heath Lark, MD as Consulting Physician (Hematology and Oncology) Elmarie Shiley, MD as Consulting Physician (Nephrology) Truman Hayward, MD as Consulting Physician (Infectious Diseases) Inez Pilgrim, MD as Consulting Physician (Oncology)  DIAGNOSIS: IgG kappa myeloma s/p BMT - Autologous at [facility] in 05/2014 HIV-positive  INTERVAL HISTORY: Ms. Brose is here today for follow-up after have her BMT at [facility] in August. She has done well. She has had no issues. With her next visit (3 months out from transplant) she will start Velcade every 2 weeks for up to two years out since she did not have a good response to the [place]. She states that she feels good. Her appetite is good and she is drinking plenty of fluids. She denies fever, chills, cough, rash, headache, dizziness, blurred vision, SOB, chest pain, palpitations, abdominal pain, constipation, diarrhea, problems urinating, blood in urine or stool. No swelling, tenderness, numbness or tingling in her extremities. No c/o pain. There have been no issues with the HIV.  CURRENT TREATMENT: She will begin Velcade with her next appointment  REVIEW OF SYSTEMS: All other 10 point review of systems is negative except for those issues mentioned above.   PAST MEDICAL HISTORY: Past Medical History  Diagnosis Date  . Pneumonia 10/2002; 2010    Required INTUBATION for streptococcal pneumonia/septicemia (10/2002)  . HIV disease DX: 01/2002  . Fibroid uterus   . History of abnormal Pap smear DX: 04/04-05/05    PAP showing LGSIL from 04/04-05/05 (biopsy showing CIN-II with moderate dysplasia) with subsequent multiple negative yearly PAP  smears  . 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 Peripheral neuropathy     resolved.   . Multiple myeloma 01/2013    01/11/2013 cytogenetics showed 59, XX, +2, +4, +5, +7, add(7)(p22), +, +9, +11, der(11) t(1;11)(q11; q24), +12, +15, -16, +17, +19, +20, +mar1, +mar2[cp6] / 15, XX [17].   Myeloma FISH showed presence of +11, and +17.    PAST SURGICAL HISTORY: Past Surgical History  Procedure Laterality Date  . No past surgeries     FAMILY HISTORY Family History  Problem Relation Age of Onset  . Hypertension Mother    GYNECOLOGIC HISTORY:  Patient's last menstrual period was 01/04/2013.   SOCIAL HISTORY:  History   Social History  . Marital Status: Single    Spouse Name: N/A    Number of Children: 0  . Years of Education: N/A   Occupational History  .      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    Social History Main Topics  . Smoking status: Never Smoker   . Smokeless tobacco: Never Used     Comment: never used tobacco  . Alcohol Use: No  . Drug Use: No  . Sexual Activity: Not Currently   Other Topics Concern  . Not on file   Social History Narrative   The patient is single. The patient has no children.   The patient is originally from [place] then moved to the [place] approximately 25 years ago.   The patient works as a Multimedia programmer Investment banker, corporate).   Patient has never smoked.  The patient does not use alcohol.    The patient has never used smokeless tobacco. The patient denies IV drug abuse.   ADVANCED DIRECTIVES: <no information>  HEALTH MAINTENANCE: History  Substance Use Topics  . Smoking status: Never Smoker   . Smokeless tobacco: Never Used     Comment: never used tobacco  . Alcohol Use: No   Colonoscopy: PAP: Bone density: Lipid panel:  Allergies  Allergen Reactions  . Aspirin     Upset stomach   . Efavirenz Hives and Rash    (brand name sustiva)    Current Outpatient Prescriptions  Medication Sig Dispense Refill  . Abacavir-Dolutegravir-Lamivud 600-50-300 MG TABS Take 1 tablet by mouth every morning.  30 tablet  11  . azithromycin (ZITHROMAX) 600 MG tablet Take one $Remove'600mg'zTTPagw$  tab every Wednesday and one $Remov'600mg'YfCLvH$  tab every Saturday.  8 tablet  6  . calcium carbonate (OS-CAL) 600 MG TABS tablet Take 600 mg by mouth daily with breakfast.      . cholecalciferol (VITAMIN D) 1000 UNITS tablet Take 1 tablet (1,000 Units total) by mouth daily.  30 tablet  3  . docusate sodium (COLACE) 100 MG capsule Take 100 mg by mouth 2 (two) times daily.      [name] lidocaine-prilocaine (EMLA) cream Apply topically as needed. Apply to portacath one hour before chemo access or blood draw.  30 g  3  . ondansetron (ZOFRAN) 8 MG tablet Take 8 mg by mouth every 8 (eight) hours as needed.       . polyethylene glycol (MIRALAX / GLYCOLAX) packet Take 17 g by mouth as needed.       . prochlorperazine (COMPAZINE) 10 MG tablet Take 10 mg by mouth as needed (Nausea or vomiting).      . promethazine (PHENERGAN) 25 MG tablet Take 25 mg by mouth as needed for nausea.      . TraMADol HCl 50 MG TBDP Take by mouth every 6 (six) hours as needed.      . valACYclovir (VALTREX) 500 MG tablet Take 1 tablet (500 mg total) by mouth daily.  30 tablet  4   No current facility-administered medications for this visit.   OBJECTIVE: Filed Vitals:   08/06/14 1416  BP: 126/85  Pulse: 86  Temp: 98.2 F (36.8 C)  Resp: 14   Body mass index is 30.2 kg/(m^2). ECOG FS:0 - Asymptomatic Ocular: Sclerae unicteric, pupils equal, round and reactive to light Ear-nose-throat: Oropharynx clear, dentition fair Lymphatic: No cervical or supraclavicular adenopathy Lungs no rales or rhonchi, good excursion bilaterally Heart regular rate and rhythm, no murmur appreciated Abd soft, nontender, positive bowel sounds MSK no focal spinal tenderness, no joint edema Neuro: non-focal, well-oriented, appropriate  affect Breasts: Deferred  LAB RESULTS: CMP     Component Value Date/Time   NA 142 07/11/2014 1451   NA 143 04/24/2014 1317   NA 141 04/04/2014 0852   K 4.1 07/11/2014 1451   K 3.4 04/24/2014 1317   K 4.2 04/04/2014 0852   CL 113* 07/11/2014 1451   CL 110* 04/24/2014 1317   CL 111* 03/29/2013 1351   CO2 20 07/11/2014 1451   CO2 23 04/24/2014 1317   CO2 22 04/04/2014 0852   GLUCOSE 109* 07/11/2014 1451   GLUCOSE 113 04/24/2014 1317   GLUCOSE 118 04/04/2014 0852   GLUCOSE 129* 03/29/2013 1351   BUN 12 07/11/2014 1451   BUN 20 04/24/2014 1317   BUN 20.9 04/04/2014 0852   CREATININE 1.03 07/11/2014 1451  CREATININE 1.1 04/24/2014 1317   CREATININE 1.2* 04/04/2014 0852   CALCIUM 8.9 07/11/2014 1451   CALCIUM 9.4 04/24/2014 1317   CALCIUM 9.4 04/04/2014 0852   PROT 6.3 07/11/2014 1451   PROT 7.7 04/24/2014 1317   PROT 7.2 04/04/2014 0852   ALBUMIN 4.1 07/11/2014 1451   ALBUMIN 3.7 04/04/2014 0852   AST 22 07/11/2014 1451   AST 22 04/24/2014 1317   AST 20 04/04/2014 0852   ALT 20 07/11/2014 1451   ALT 15 04/24/2014 1317   ALT 17 04/04/2014 0852   ALKPHOS 116 07/11/2014 1451   ALKPHOS 89* 04/24/2014 1317   ALKPHOS 94 04/04/2014 0852   BILITOT 0.5 07/11/2014 1451   BILITOT 0.70 04/24/2014 1317   BILITOT 0.23 04/04/2014 0852   GFRNONAA 67 05/02/2013 1412   GFRNONAA 12* 02/04/2013 0850   GFRAA 77 05/02/2013 1412   GFRAA 14* 02/04/2013 0850   No results found for this basename: SPEP,  UPEP,   kappa and lambda light chains   Lab Results  Component Value Date   WBC 5.0 08/06/2014   NEUTROABS 2.1 08/06/2014   HGB 12.4 08/06/2014   HCT 35.8 08/06/2014   MCV 105* 08/06/2014   PLT 166 08/06/2014   No results found for this basename: LABCA2   No components found with this basename: LABCA125   No results found for this basename: INR,  in the last 168 hours  STUDIES: No results found.  ASSESSMENT/PLAN: Ms. Jennifer Boyle is 48 year old African American female who is HIV positive. She has IgG kappa myeloma and had  a BMT in August. She has done well with this.  Her CBC today was good. We will wait and see what the rest of her labs show.  She will begin Velcade injections every two weeks with her next visit. This will continue up to two years out from transplant.   We will see her back in 3 weeks for follow-up, labs and injection. She knows to call here with any questions or concerns and to go to the ED in the event of an emergency. We can certainly see her sooner if need be.    Eliezer Bottom, NP 08/06/2014 2:46 PM

## 2014-08-12 ENCOUNTER — Other Ambulatory Visit: Payer: 59

## 2014-08-12 DIAGNOSIS — B20 Human immunodeficiency virus [HIV] disease: Secondary | ICD-10-CM

## 2014-08-13 LAB — T-HELPER CELL (CD4) - (RCID CLINIC ONLY)
CD4 % Helper T Cell: 14 % — ABNORMAL LOW (ref 33–55)
CD4 T Cell Abs: 300 /uL — ABNORMAL LOW (ref 400–2700)

## 2014-08-15 LAB — HIV-1 RNA QUANT-NO REFLEX-BLD
HIV 1 RNA Quant: 141 copies/mL — ABNORMAL HIGH (ref <20)
HIV-1 RNA Quant, Log: 2.15 {Log} — ABNORMAL HIGH (ref <1.30)

## 2014-08-20 ENCOUNTER — Other Ambulatory Visit: Payer: Self-pay | Admitting: Lab

## 2014-08-21 ENCOUNTER — Ambulatory Visit: Payer: 59

## 2014-08-21 ENCOUNTER — Other Ambulatory Visit (HOSPITAL_BASED_OUTPATIENT_CLINIC_OR_DEPARTMENT_OTHER): Payer: 59 | Admitting: Lab

## 2014-08-21 VITALS — BP 136/83 | HR 94 | Temp 97.6°F | Resp 16

## 2014-08-21 DIAGNOSIS — C9000 Multiple myeloma not having achieved remission: Secondary | ICD-10-CM

## 2014-08-21 DIAGNOSIS — C9001 Multiple myeloma in remission: Secondary | ICD-10-CM

## 2014-08-21 LAB — CBC WITH DIFFERENTIAL (CANCER CENTER ONLY)
BASO#: 0 10*3/uL (ref 0.0–0.2)
BASO%: 0.2 % (ref 0.0–2.0)
EOS ABS: 0 10*3/uL (ref 0.0–0.5)
EOS%: 0.6 % (ref 0.0–7.0)
HEMATOCRIT: 36.3 % (ref 34.8–46.6)
HEMOGLOBIN: 12.4 g/dL (ref 11.6–15.9)
LYMPH#: 3.2 10*3/uL (ref 0.9–3.3)
LYMPH%: 49.2 % — ABNORMAL HIGH (ref 14.0–48.0)
MCH: 36.6 pg — ABNORMAL HIGH (ref 26.0–34.0)
MCHC: 34.2 g/dL (ref 32.0–36.0)
MCV: 107 fL — ABNORMAL HIGH (ref 81–101)
MONO#: 0.5 10*3/uL (ref 0.1–0.9)
MONO%: 7.2 % (ref 0.0–13.0)
NEUT#: 2.8 10*3/uL (ref 1.5–6.5)
NEUT%: 42.8 % (ref 39.6–80.0)
Platelets: 173 10*3/uL (ref 145–400)
RBC: 3.39 10*6/uL — ABNORMAL LOW (ref 3.70–5.32)
RDW: 13.5 % (ref 11.1–15.7)
WBC: 6.6 10*3/uL (ref 3.9–10.0)

## 2014-08-21 LAB — COMPREHENSIVE METABOLIC PANEL
ALK PHOS: 95 U/L (ref 39–117)
ALT: 15 U/L (ref 0–35)
AST: 19 U/L (ref 0–37)
Albumin: 4.2 g/dL (ref 3.5–5.2)
BUN: 25 mg/dL — AB (ref 6–23)
CALCIUM: 9.3 mg/dL (ref 8.4–10.5)
CHLORIDE: 106 meq/L (ref 96–112)
CO2: 23 mEq/L (ref 19–32)
CREATININE: 1.14 mg/dL — AB (ref 0.50–1.10)
Glucose, Bld: 136 mg/dL — ABNORMAL HIGH (ref 70–99)
Potassium: 3.8 mEq/L (ref 3.5–5.3)
Sodium: 139 mEq/L (ref 135–145)
Total Bilirubin: 0.2 mg/dL (ref 0.2–1.2)
Total Protein: 6.4 g/dL (ref 6.0–8.3)

## 2014-08-21 LAB — LACTATE DEHYDROGENASE: LDH: 188 U/L (ref 94–250)

## 2014-08-21 MED ORDER — SODIUM CHLORIDE 0.9 % IJ SOLN
10.0000 mL | INTRAMUSCULAR | Status: DC | PRN
  Administered 2014-08-21: 10 mL via INTRAVENOUS
  Filled 2014-08-21: qty 10

## 2014-08-21 MED ORDER — HEPARIN SOD (PORK) LOCK FLUSH 100 UNIT/ML IV SOLN
500.0000 [IU] | Freq: Once | INTRAVENOUS | Status: AC
  Administered 2014-08-21: 500 [IU] via INTRAVENOUS
  Filled 2014-08-21: qty 5

## 2014-08-21 NOTE — Patient Instructions (Signed)

## 2014-08-26 ENCOUNTER — Ambulatory Visit (INDEPENDENT_AMBULATORY_CARE_PROVIDER_SITE_OTHER): Payer: 59 | Admitting: Infectious Disease

## 2014-08-26 ENCOUNTER — Encounter: Payer: Self-pay | Admitting: Infectious Disease

## 2014-08-26 VITALS — BP 130/89 | Temp 98.2°F | Wt 183.0 lb

## 2014-08-26 DIAGNOSIS — B20 Human immunodeficiency virus [HIV] disease: Secondary | ICD-10-CM

## 2014-08-26 DIAGNOSIS — N08 Glomerular disorders in diseases classified elsewhere: Secondary | ICD-10-CM

## 2014-08-26 DIAGNOSIS — C9000 Multiple myeloma not having achieved remission: Secondary | ICD-10-CM

## 2014-08-26 DIAGNOSIS — Z9481 Bone marrow transplant status: Secondary | ICD-10-CM

## 2014-08-26 DIAGNOSIS — N2889 Other specified disorders of kidney and ureter: Secondary | ICD-10-CM

## 2014-08-26 NOTE — Progress Notes (Signed)
Subjective:    Patient ID: Jennifer Boyle, female    DOB: [birth year], 48 y.o.   MRN: [id number]  HPI   Jennifer Boyle is a 48 y.o. female with HIV infection who is doing superbly well on her  antiviral regimen, with undetectable viral load and health cd4 count when last checked. She has been diagnosed with multiple myeloma and is receiving chemotherapy through the [facility] but has since undergone bone marrow transplant successfully at [facility] never really had a drop in her CD4 count. I was called by [facility] and they told me her CD4 was 24 but it had actually been 24% with absolute CD4 of over 500 at the time of that call. Because I was concerned when called that her CD4 count was actually 24 I recommended that she be placed on PCP prophylaxis as well as Mycobacterium avium prophylaxis. She was continued on both until recently when she was taken off of her Bactrim her most recent CD4 count at [facility] was above 400 here was above 300. She has continued on azithromycin but she does not need this with her CD4 count not having dropped below 100. I've asked her to stop her azithromycin. She is supposed to get infusions of Velcade here locally in [place] but this has not yet been started. She is in great spirits and doing quite well she states that she has received her influenza vaccination at work.  Review of Systems  Constitutional: Negative for fever, chills, diaphoresis, activity change, appetite change, fatigue and unexpected weight change.  HENT: Negative for congestion, rhinorrhea, sinus pressure, sneezing, sore throat and trouble swallowing.   Eyes: Negative for photophobia and visual disturbance.  Respiratory: Negative for cough, chest tightness, shortness of breath, wheezing and stridor.   Cardiovascular: Negative for chest pain, palpitations and leg swelling.  Gastrointestinal: Negative for nausea, vomiting, abdominal pain, diarrhea, constipation, blood in stool, abdominal distention and anal  bleeding.  Genitourinary: Negative for dysuria, hematuria, flank pain and difficulty urinating.  Musculoskeletal: Negative for myalgias, back pain, joint swelling, arthralgias and gait problem.  Skin: Negative for color change, pallor, rash and wound.  Neurological: Negative for dizziness, tremors, weakness and light-headedness.  Hematological: Negative for adenopathy. Does not bruise/bleed easily.  Psychiatric/Behavioral: Negative for behavioral problems, confusion, sleep disturbance, dysphoric mood, decreased concentration and agitation.       Objective:   Physical Exam  Constitutional: She is oriented to person, place, and time. She appears well-developed and well-nourished. No distress.  HENT:  Head: Normocephalic and atraumatic.  Mouth/Throat: Oropharynx is clear and moist. No oropharyngeal exudate.  Eyes: Conjunctivae and EOM are normal. Pupils are equal, round, and reactive to light. No scleral icterus.  Neck: Normal range of motion. Neck supple. No JVD present.  Cardiovascular: Normal rate, regular rhythm and normal heart sounds.  Exam reveals no gallop and no friction rub.   No murmur heard. Pulmonary/Chest: Effort normal and breath sounds normal. No respiratory distress. She has no wheezes. She has no rales. She exhibits no tenderness.  Abdominal: She exhibits no distension and no mass. There is no tenderness. There is no rebound and no guarding.  Musculoskeletal: She exhibits no edema or tenderness.  Lymphadenopathy:    She has no cervical adenopathy.  Neurological: She is alert and oriented to person, place, and time. She has normal reflexes. She exhibits normal muscle tone. Coordination normal.  Skin: Skin is warm and dry. She is not diaphoretic. No erythema. No pallor.  Psychiatric: She has a normal mood and  affect. Her behavior is normal. Judgment and thought content normal.          Assessment & Plan:  HIV: continue [place]. Will have labs sent to me to see what her  CBC does while on her Velcade we'll recheck HIV labs formally in 2 months.I spent greater than 25 minutes with the patient including greater than 50% of time in face to face counsel of the patient and in coordination of their care.   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

## 2014-09-02 ENCOUNTER — Other Ambulatory Visit (HOSPITAL_BASED_OUTPATIENT_CLINIC_OR_DEPARTMENT_OTHER): Payer: 59 | Admitting: Lab

## 2014-09-02 ENCOUNTER — Ambulatory Visit (HOSPITAL_BASED_OUTPATIENT_CLINIC_OR_DEPARTMENT_OTHER): Payer: 59

## 2014-09-02 ENCOUNTER — Encounter: Payer: Self-pay | Admitting: Hematology & Oncology

## 2014-09-02 ENCOUNTER — Ambulatory Visit (HOSPITAL_BASED_OUTPATIENT_CLINIC_OR_DEPARTMENT_OTHER): Payer: 59 | Admitting: Hematology & Oncology

## 2014-09-02 VITALS — BP 139/84 | HR 91 | Temp 98.4°F | Resp 14 | Ht 64.0 in | Wt 174.0 lb

## 2014-09-02 DIAGNOSIS — C9000 Multiple myeloma not having achieved remission: Secondary | ICD-10-CM

## 2014-09-02 DIAGNOSIS — Z5111 Encounter for antineoplastic chemotherapy: Secondary | ICD-10-CM

## 2014-09-02 DIAGNOSIS — B20 Human immunodeficiency virus [HIV] disease: Secondary | ICD-10-CM

## 2014-09-02 DIAGNOSIS — C9001 Multiple myeloma in remission: Secondary | ICD-10-CM

## 2014-09-02 DIAGNOSIS — Z9484 Stem cells transplant status: Secondary | ICD-10-CM

## 2014-09-02 LAB — CBC WITH DIFFERENTIAL (CANCER CENTER ONLY)
BASO#: 0 10*3/uL (ref 0.0–0.2)
BASO%: 0.3 % (ref 0.0–2.0)
EOS%: 1 % (ref 0.0–7.0)
Eosinophils Absolute: 0 10*3/uL (ref 0.0–0.5)
HEMATOCRIT: 39.1 % (ref 34.8–46.6)
HEMOGLOBIN: 13.2 g/dL (ref 11.6–15.9)
LYMPH#: 1.4 10*3/uL (ref 0.9–3.3)
LYMPH%: 36 % (ref 14.0–48.0)
MCH: 36.2 pg — ABNORMAL HIGH (ref 26.0–34.0)
MCHC: 33.8 g/dL (ref 32.0–36.0)
MCV: 107 fL — ABNORMAL HIGH (ref 81–101)
MONO#: 0.4 10*3/uL (ref 0.1–0.9)
MONO%: 9.7 % (ref 0.0–13.0)
NEUT#: 2.1 10*3/uL (ref 1.5–6.5)
NEUT%: 53 % (ref 39.6–80.0)
Platelets: 171 10*3/uL (ref 145–400)
RBC: 3.65 10*6/uL — ABNORMAL LOW (ref 3.70–5.32)
RDW: 12.6 % (ref 11.1–15.7)
WBC: 3.9 10*3/uL (ref 3.9–10.0)

## 2014-09-02 LAB — COMPREHENSIVE METABOLIC PANEL
ALBUMIN: 4 g/dL (ref 3.5–5.2)
ALT: 18 U/L (ref 0–35)
AST: 22 U/L (ref 0–37)
Alkaline Phosphatase: 86 U/L (ref 39–117)
BUN: 14 mg/dL (ref 6–23)
CALCIUM: 9.3 mg/dL (ref 8.4–10.5)
CO2: 19 meq/L (ref 19–32)
CREATININE: 1.11 mg/dL — AB (ref 0.50–1.10)
Chloride: 110 mEq/L (ref 96–112)
Glucose, Bld: 110 mg/dL — ABNORMAL HIGH (ref 70–99)
POTASSIUM: 3.6 meq/L (ref 3.5–5.3)
Sodium: 143 mEq/L (ref 135–145)
TOTAL PROTEIN: 6.1 g/dL (ref 6.0–8.3)
Total Bilirubin: 0.5 mg/dL (ref 0.2–1.2)

## 2014-09-02 LAB — LACTATE DEHYDROGENASE: LDH: 183 U/L (ref 94–250)

## 2014-09-02 MED ORDER — BORTEZOMIB CHEMO SQ INJECTION 3.5 MG (2.5MG/ML)
1.3000 mg/m2 | Freq: Once | INTRAMUSCULAR | Status: AC
  Administered 2014-09-02: 2.5 mg via SUBCUTANEOUS
  Filled 2014-09-02: qty 2.5

## 2014-09-02 MED ORDER — ONDANSETRON HCL 8 MG PO TABS
8.0000 mg | ORAL_TABLET | Freq: Once | ORAL | Status: AC
  Administered 2014-09-02: 8 mg via ORAL

## 2014-09-02 MED ORDER — HEPARIN SOD (PORK) LOCK FLUSH 100 UNIT/ML IV SOLN
500.0000 [IU] | Freq: Once | INTRAVENOUS | Status: AC
  Administered 2014-09-02: 500 [IU] via INTRAVENOUS
  Filled 2014-09-02: qty 5

## 2014-09-02 MED ORDER — SODIUM CHLORIDE 0.9 % IJ SOLN
10.0000 mL | INTRAMUSCULAR | Status: DC | PRN
  Administered 2014-09-02: 10 mL via INTRAVENOUS
  Filled 2014-09-02: qty 10

## 2014-09-02 MED ORDER — ONDANSETRON HCL 8 MG PO TABS
ORAL_TABLET | ORAL | Status: AC
Start: 2014-09-02 — End: 2014-09-02
  Filled 2014-09-02: qty 1

## 2014-09-02 NOTE — Progress Notes (Signed)
Hematology and Oncology Follow Up Visit  Jennifer Boyle [id number] 1966-03-14 48 y.o. 09/02/2014   Principle Diagnosis:   IgG kappa myeloma   HIV-positive  Current Therapy:    S/p BMT - Autologous at [facility] in 05/2014     Interim History:  Jennifer Boyle is back for followup. She did undergo her autologous stem cell for the myeloma. She did really well with this. She was treated at [facility]. She received melphalan for her conditioning regimen.  She was last seen at [facility] about a month or so ago. They recommended Velcade for maintenance therapy. I think this is reasonable.  Her HIV has not been much of a problem. She sees her HIV doctor in town. He actually has cut back on some of her medications.  She's back to work. 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 She actually is working over the Thanksgiving holiday.   She's had no problem with fever. She's had no rashes. She's had no nausea or vomiting. She's had no abdominal pain. There's been no cough. She's had no leg swelling. She's had no headache.  Currently, her performance status is ECOG 1.  Medications: Current outpatient prescriptions: Abacavir-Dolutegravir-Lamivud 600-50-300 MG TABS, Take 1 tablet by mouth every morning., Disp: 30 tablet, Rfl: 11;  calcium carbonate (OS-CAL) 600 MG TABS tablet, Take 600 mg by mouth daily with breakfast., Disp: , Rfl: ;  cholecalciferol (VITAMIN D) 1000 UNITS tablet, Take 1 tablet (1,000 Units total) by mouth daily., Disp: 30 tablet, Rfl: 3 docusate sodium (COLACE) 100 MG capsule, Take 100 mg by mouth 2 (two) times daily., Disp: , Rfl: ;  lidocaine-prilocaine (EMLA) cream, Apply topically as needed. Apply to portacath one hour before chemo access or blood draw., Disp: 30 g, Rfl: 3;  ondansetron (ZOFRAN) 8 MG tablet, Take 8 mg by mouth every 8 (eight) hours as needed. , Disp: , Rfl: ;  polyethylene glycol (MIRALAX / GLYCOLAX) packet, Take 17 g by mouth as needed. , Disp: , Rfl:    TraMADol HCl 50 MG TBDP, Take by mouth every 6 (six) hours as needed., Disp: , Rfl: ;  valACYclovir (VALTREX) 500 MG tablet, Take 1 tablet (500 mg total) by mouth daily., Disp: 30 tablet, Rfl: 4 No current facility-administered medications for this visit. Facility-Administered Medications Ordered in Other Visits: sodium chloride 0.9 % injection 10 mL, 10 mL, Intravenous, PRN, Volanda Napoleon, MD, 10 mL at 09/02/14 1522  Allergies:  Allergies  Allergen Reactions  . Aspirin     Upset stomach   . Efavirenz Hives and Rash    (brand name sustiva)    Past Medical History, Surgical history, Social history, and Family History were reviewed and updated.  Review of Systems: As above  Physical Exam:  height is 5\' 4"  (1.626 m) and weight is 174 lb (78.926 kg). Her oral temperature is 98.4 F (36.9 C). Her blood pressure is 139/84 and her pulse is 91. Her respiration is 14.   [name]. Head and exam shows no ocular or oral lesions. There are no palpable cervical or supraclavicular lymph nodes. Lungs are clear. Cardiac exam regular rate and rhythm with no murmurs rubs or bruits. Abdomen soft. She has good bowel sounds. There is no fluid wave. There is no palpable liver or spleen tip. Back exam no tenderness over the spine, ribs or hips. Extremities shows no clubbing, cyanosis or edema. Neurological exam shows no focal neurological deficits. Skin exam no  rashes, ecchymosis or petechia.  Lab Results  Component Value Date   WBC 3.9 09/02/2014   HGB 13.2 09/02/2014   HCT 39.1 09/02/2014   MCV 107* 09/02/2014   PLT 171 09/02/2014     Chemistry      Component Value Date/Time   NA 139 08/21/2014 0905   NA 143 04/24/2014 1317   NA 141 04/04/2014 0852   K 3.8 08/21/2014 0905   K 3.4 04/24/2014 1317   K 4.2 04/04/2014 0852   CL 106 08/21/2014 0905   CL 110* 04/24/2014 1317   CL 111* 03/29/2013 1351   CO2 23 08/21/2014 0905   CO2 23 04/24/2014 1317   CO2  22 04/04/2014 0852   BUN 25* 08/21/2014 0905   BUN 20 04/24/2014 1317   BUN 20.9 04/04/2014 0852   CREATININE 1.14* 08/21/2014 0905   CREATININE 1.1 04/24/2014 1317   CREATININE 1.2* 04/04/2014 0852      Component Value Date/Time   CALCIUM 9.3 08/21/2014 0905   CALCIUM 9.4 04/24/2014 1317   CALCIUM 9.4 04/04/2014 0852   ALKPHOS 95 08/21/2014 0905   ALKPHOS 89* 04/24/2014 1317   ALKPHOS 94 04/04/2014 0852   AST 19 08/21/2014 0905   AST 22 04/24/2014 1317   AST 20 04/04/2014 0852   ALT 15 08/21/2014 0905   ALT 15 04/24/2014 1317   ALT 17 04/04/2014 0852   BILITOT 0.2 08/21/2014 0905   BILITOT 0.70 04/24/2014 1317   BILITOT 0.23 04/04/2014 0852         Impression and Plan: Jennifer Boyle is 48 year old African [place] female. She has IgG kappa myeloma. She ultimately underwent a stem cell transplant. This was back on August 14.  We will restart her on Velcade for maintenance. This was suggested by her transplant doctors down at [facility]. They recommended every other week Velcade. She is not too happy about this because of the discomfort with Velcade injections.  The FDA just approved a oral form of Velcade. This might be something that we could look into.   She will need Zometa. I think we probably can do this every 3 months. We will recheck her blood counts every couple weeks.  I will see her back in one month.  Volanda Napoleon, MD 11/24/20154:42 PM

## 2014-09-02 NOTE — Patient Instructions (Signed)
Bortezomib injection What is this medicine? BORTEZOMIB (bor TEZ oh mib) is a chemotherapy drug. It slows the growth of cancer cells. This medicine is used to treat multiple myeloma, and certain lymphomas, such as mantle-cell lymphoma. This medicine may be used for other purposes; ask your health care provider or pharmacist if you have questions. COMMON BRAND NAME(S): Velcade What should I tell my health care provider before I take this medicine? They need to know if you have any of these conditions: -diabetes -heart disease -irregular heartbeat -liver disease -on hemodialysis -low blood counts, like low white blood cells, platelets, or hemoglobin -peripheral neuropathy -taking medicine for blood pressure -an unusual or allergic reaction to bortezomib, mannitol, boron, other medicines, foods, dyes, or preservatives -pregnant or trying to get pregnant -breast-feeding How should I use this medicine? This medicine is for injection into a vein or for injection under the skin. It is given by a health care professional in a hospital or clinic setting. Talk to your pediatrician regarding the use of this medicine in children. Special care may be needed. Overdosage: If you think you have taken too much of this medicine contact a poison control center or emergency room at once. NOTE: This medicine is only for you. Do not share this medicine with others. What if I miss a dose? It is important not to miss your dose. Call your doctor or health care professional if you are unable to keep an appointment. What may interact with this medicine? This medicine may interact with the following medications: -ketoconazole -rifampin -ritonavir -St. John's Wort This list may not describe all possible interactions. Give your health care provider a list of all the medicines, herbs, non-prescription drugs, or dietary supplements you use. Also tell them if you smoke, drink alcohol, or use illegal drugs. Some items  may interact with your medicine. What should I watch for while using this medicine? Visit your doctor for checks on your progress. This drug may make you feel generally unwell. This is not uncommon, as chemotherapy can affect healthy cells as well as cancer cells. Report any side effects. Continue your course of treatment even though you feel ill unless your doctor tells you to stop. You may get drowsy or dizzy. Do not drive, use machinery, or do anything that needs mental alertness until you know how this medicine affects you. Do not stand or sit up quickly, especially if you are an older patient. This reduces the risk of dizzy or fainting spells. In some cases, you may be given additional medicines to help with side effects. Follow all directions for their use. Call your doctor or health care professional for advice if you get a fever, chills or sore throat, or other symptoms of a cold or flu. Do not treat yourself. This drug decreases your body's ability to fight infections. Try to avoid being around people who are sick. This medicine may increase your risk to bruise or bleed. Call your doctor or health care professional if you notice any unusual bleeding. You may need blood work done while you are taking this medicine. In some patients, this medicine may cause a serious brain infection that may cause death. If you have any problems seeing, thinking, speaking, walking, or standing, tell your doctor right away. If you cannot reach your doctor, urgently seek other source of medical care. Do not become pregnant while taking this medicine. Women should inform their doctor if they wish to become pregnant or think they might be pregnant. There is   a potential for serious side effects to an unborn child. Talk to your health care professional or pharmacist for more information. Do not breast-feed an infant while taking this medicine. Check with your doctor or health care professional if you get an attack of  severe diarrhea, nausea and vomiting, or if you sweat a lot. The loss of too much body fluid can make it dangerous for you to take this medicine. What side effects may I notice from receiving this medicine? Side effects that you should report to your doctor or health care professional as soon as possible: -allergic reactions like skin rash, itching or hives, swelling of the face, lips, or tongue -breathing problems -changes in hearing -changes in vision -fast, irregular heartbeat -feeling faint or lightheaded, falls -pain, tingling, numbness in the hands or feet -right upper belly pain -seizures -swelling of the ankles, feet, hands -unusual bleeding or bruising -unusually weak or tired -vomiting -yellowing of the eyes or skin Side effects that usually do not require medical attention (report to your doctor or health care professional if they continue or are bothersome): -changes in emotions or moods -constipation -diarrhea -loss of appetite -headache -irritation at site where injected -nausea This list may not describe all possible side effects. Call your doctor for medical advice about side effects. You may report side effects to FDA at 1-800-FDA-1088. Where should I keep my medicine? This drug is given in a hospital or clinic and will not be stored at home. NOTE: This sheet is a summary. It may not cover all possible information. If you have questions about this medicine, talk to your doctor, pharmacist, or health care provider.  2015, Elsevier/Gold Standard. (2013-07-22 12:46:32)  

## 2014-09-03 ENCOUNTER — Telehealth: Payer: Self-pay | Admitting: Hematology & Oncology

## 2014-09-03 NOTE — Telephone Encounter (Signed)
Left pt message with 12-8 appointment, per inbasket ok to leave message

## 2014-09-16 ENCOUNTER — Ambulatory Visit: Payer: Self-pay

## 2014-09-16 ENCOUNTER — Other Ambulatory Visit (HOSPITAL_BASED_OUTPATIENT_CLINIC_OR_DEPARTMENT_OTHER): Payer: 59 | Admitting: Lab

## 2014-09-16 ENCOUNTER — Ambulatory Visit (HOSPITAL_BASED_OUTPATIENT_CLINIC_OR_DEPARTMENT_OTHER): Payer: 59

## 2014-09-16 ENCOUNTER — Other Ambulatory Visit: Payer: Self-pay | Admitting: Lab

## 2014-09-16 VITALS — BP 134/90 | HR 105 | Temp 97.8°F | Resp 16

## 2014-09-16 DIAGNOSIS — C9000 Multiple myeloma not having achieved remission: Secondary | ICD-10-CM

## 2014-09-16 DIAGNOSIS — Z5112 Encounter for antineoplastic immunotherapy: Secondary | ICD-10-CM

## 2014-09-16 LAB — CBC WITH DIFFERENTIAL (CANCER CENTER ONLY)
BASO#: 0 10*3/uL (ref 0.0–0.2)
BASO%: 0 % (ref 0.0–2.0)
EOS ABS: 0.1 10*3/uL (ref 0.0–0.5)
EOS%: 1.1 % (ref 0.0–7.0)
HCT: 38.6 % (ref 34.8–46.6)
HEMOGLOBIN: 13.1 g/dL (ref 11.6–15.9)
LYMPH#: 2.7 10*3/uL (ref 0.9–3.3)
LYMPH%: 48.5 % — AB (ref 14.0–48.0)
MCH: 35.9 pg — ABNORMAL HIGH (ref 26.0–34.0)
MCHC: 33.9 g/dL (ref 32.0–36.0)
MCV: 106 fL — AB (ref 81–101)
MONO#: 0.6 10*3/uL (ref 0.1–0.9)
MONO%: 10.2 % (ref 0.0–13.0)
NEUT%: 40.2 % (ref 39.6–80.0)
NEUTROS ABS: 2.2 10*3/uL (ref 1.5–6.5)
PLATELETS: 193 10*3/uL (ref 145–400)
RBC: 3.65 10*6/uL — AB (ref 3.70–5.32)
RDW: 12.1 % (ref 11.1–15.7)
WBC: 5.5 10*3/uL (ref 3.9–10.0)

## 2014-09-16 LAB — CMP (CANCER CENTER ONLY)
ALK PHOS: 88 U/L — AB (ref 26–84)
ALT(SGPT): 20 U/L (ref 10–47)
AST: 22 U/L (ref 11–38)
Albumin: 3.4 g/dL (ref 3.3–5.5)
BUN: 15 mg/dL (ref 7–22)
CO2: 25 meq/L (ref 18–33)
Calcium: 9.2 mg/dL (ref 8.0–10.3)
Chloride: 107 mEq/L (ref 98–108)
Creat: 1 mg/dl (ref 0.6–1.2)
GLUCOSE: 119 mg/dL — AB (ref 73–118)
POTASSIUM: 3.6 meq/L (ref 3.3–4.7)
Sodium: 148 mEq/L — ABNORMAL HIGH (ref 128–145)
Total Bilirubin: 0.5 mg/dl (ref 0.20–1.60)
Total Protein: 6.9 g/dL (ref 6.4–8.1)

## 2014-09-16 MED ORDER — BORTEZOMIB CHEMO SQ INJECTION 3.5 MG (2.5MG/ML)
1.3000 mg/m2 | Freq: Once | INTRAMUSCULAR | Status: AC
  Administered 2014-09-16: 2.5 mg via SUBCUTANEOUS
  Filled 2014-09-16: qty 2.5

## 2014-09-16 MED ORDER — ONDANSETRON HCL 8 MG PO TABS
8.0000 mg | ORAL_TABLET | Freq: Once | ORAL | Status: AC
  Administered 2014-09-16: 8 mg via ORAL

## 2014-09-16 MED ORDER — SODIUM CHLORIDE 0.9 % IJ SOLN
10.0000 mL | INTRAMUSCULAR | Status: DC | PRN
  Administered 2014-09-16: 10 mL
  Filled 2014-09-16: qty 10

## 2014-09-16 MED ORDER — ONDANSETRON HCL 8 MG PO TABS
ORAL_TABLET | ORAL | Status: AC
  Filled 2014-09-16: qty 1

## 2014-09-16 MED ORDER — HEPARIN SOD (PORK) LOCK FLUSH 100 UNIT/ML IV SOLN
500.0000 [IU] | Freq: Once | INTRAVENOUS | Status: AC | PRN
  Administered 2014-09-16: 500 [IU]
  Filled 2014-09-16: qty 5

## 2014-09-16 NOTE — Patient Instructions (Signed)
Bortezomib injection What is this medicine? BORTEZOMIB (bor TEZ oh mib) is a chemotherapy drug. It slows the growth of cancer cells. This medicine is used to treat multiple myeloma, and certain lymphomas, such as mantle-cell lymphoma. This medicine may be used for other purposes; ask your health care provider or pharmacist if you have questions. COMMON BRAND NAME(S): Velcade What should I tell my health care provider before I take this medicine? They need to know if you have any of these conditions: -diabetes -heart disease -irregular heartbeat -liver disease -on hemodialysis -low blood counts, like low white blood cells, platelets, or hemoglobin -peripheral neuropathy -taking medicine for blood pressure -an unusual or allergic reaction to bortezomib, mannitol, boron, other medicines, foods, dyes, or preservatives -pregnant or trying to get pregnant -breast-feeding How should I use this medicine? This medicine is for injection into a vein or for injection under the skin. It is given by a health care professional in a hospital or clinic setting. Talk to your pediatrician regarding the use of this medicine in children. Special care may be needed. Overdosage: If you think you have taken too much of this medicine contact a poison control center or emergency room at once. NOTE: This medicine is only for you. Do not share this medicine with others. What if I miss a dose? It is important not to miss your dose. Call your doctor or health care professional if you are unable to keep an appointment. What may interact with this medicine? This medicine may interact with the following medications: -ketoconazole -rifampin -ritonavir -St. John's Wort This list may not describe all possible interactions. Give your health care provider a list of all the medicines, herbs, non-prescription drugs, or dietary supplements you use. Also tell them if you smoke, drink alcohol, or use illegal drugs. Some items  may interact with your medicine. What should I watch for while using this medicine? Visit your doctor for checks on your progress. This drug may make you feel generally unwell. This is not uncommon, as chemotherapy can affect healthy cells as well as cancer cells. Report any side effects. Continue your course of treatment even though you feel ill unless your doctor tells you to stop. You may get drowsy or dizzy. Do not drive, use machinery, or do anything that needs mental alertness until you know how this medicine affects you. Do not stand or sit up quickly, especially if you are an older patient. This reduces the risk of dizzy or fainting spells. In some cases, you may be given additional medicines to help with side effects. Follow all directions for their use. Call your doctor or health care professional for advice if you get a fever, chills or sore throat, or other symptoms of a cold or flu. Do not treat yourself. This drug decreases your body's ability to fight infections. Try to avoid being around people who are sick. This medicine may increase your risk to bruise or bleed. Call your doctor or health care professional if you notice any unusual bleeding. You may need blood work done while you are taking this medicine. In some patients, this medicine may cause a serious brain infection that may cause death. If you have any problems seeing, thinking, speaking, walking, or standing, tell your doctor right away. If you cannot reach your doctor, urgently seek other source of medical care. Do not become pregnant while taking this medicine. Women should inform their doctor if they wish to become pregnant or think they might be pregnant. There is   a potential for serious side effects to an unborn child. Talk to your health care professional or pharmacist for more information. Do not breast-feed an infant while taking this medicine. Check with your doctor or health care professional if you get an attack of  severe diarrhea, nausea and vomiting, or if you sweat a lot. The loss of too much body fluid can make it dangerous for you to take this medicine. What side effects may I notice from receiving this medicine? Side effects that you should report to your doctor or health care professional as soon as possible: -allergic reactions like skin rash, itching or hives, swelling of the face, lips, or tongue -breathing problems -changes in hearing -changes in vision -fast, irregular heartbeat -feeling faint or lightheaded, falls -pain, tingling, numbness in the hands or feet -right upper belly pain -seizures -swelling of the ankles, feet, hands -unusual bleeding or bruising -unusually weak or tired -vomiting -yellowing of the eyes or skin Side effects that usually do not require medical attention (report to your doctor or health care professional if they continue or are bothersome): -changes in emotions or moods -constipation -diarrhea -loss of appetite -headache -irritation at site where injected -nausea This list may not describe all possible side effects. Call your doctor for medical advice about side effects. You may report side effects to FDA at 1-800-FDA-1088. Where should I keep my medicine? This drug is given in a hospital or clinic and will not be stored at home. NOTE: This sheet is a summary. It may not cover all possible information. If you have questions about this medicine, talk to your doctor, pharmacist, or health care provider.  2015, Elsevier/Gold Standard. (2013-07-22 12:46:32)  

## 2014-09-17 ENCOUNTER — Telehealth: Payer: Self-pay | Admitting: *Deleted

## 2014-09-17 NOTE — Telephone Encounter (Signed)
-----   Message from Truman Hayward, MD sent at 09/17/2014  7:86 AM EST ----- Masen sodium was a bit high. Can we make sure she if feeling ok ? I have never seen it this high (148). If she does not have an appt soon she should definitely have it rechecked before then

## 2014-09-17 NOTE — Telephone Encounter (Signed)
She does have significant hypernatremia I don't have it maybe related to her chemotherapy. I would recommend her having repeat basic metabolic panel along with serum osmolality and urine osmolarity and urine sodium. She also may want to check with Dr. Marin Olp re potential side effects of drug

## 2014-09-17 NOTE — Telephone Encounter (Signed)
Patient called back and said that she is feeling fine. Does she need a sooner lab appointment here?

## 2014-09-17 NOTE — Telephone Encounter (Signed)
Left generic message letting patient know that Dr. Tommy Medal has some concerns about one of her recent lab results, asked nursing to check on patient. Asked patient to call us back.  Patient is scheduled for labs at [place] 1/5. Landis Gandy, RN

## 2014-09-29 ENCOUNTER — Other Ambulatory Visit: Payer: Self-pay | Admitting: *Deleted

## 2014-09-29 DIAGNOSIS — B20 Human immunodeficiency virus [HIV] disease: Secondary | ICD-10-CM

## 2014-09-29 MED ORDER — ABACAVIR-DOLUTEGRAVIR-LAMIVUD 600-50-300 MG PO TABS: 1.0000 | ORAL_TABLET | Freq: Every morning | ORAL | Status: DC

## 2014-09-30 ENCOUNTER — Ambulatory Visit: Payer: Self-pay

## 2014-09-30 ENCOUNTER — Other Ambulatory Visit: Payer: Self-pay | Admitting: Lab

## 2014-09-30 ENCOUNTER — Ambulatory Visit: Payer: Self-pay | Admitting: Hematology & Oncology

## 2014-10-02 ENCOUNTER — Encounter: Payer: Self-pay | Admitting: Hematology & Oncology

## 2014-10-02 ENCOUNTER — Ambulatory Visit (HOSPITAL_BASED_OUTPATIENT_CLINIC_OR_DEPARTMENT_OTHER): Payer: 59 | Admitting: Lab

## 2014-10-02 ENCOUNTER — Ambulatory Visit (HOSPITAL_BASED_OUTPATIENT_CLINIC_OR_DEPARTMENT_OTHER): Payer: 59 | Admitting: Hematology & Oncology

## 2014-10-02 ENCOUNTER — Ambulatory Visit (HOSPITAL_BASED_OUTPATIENT_CLINIC_OR_DEPARTMENT_OTHER): Payer: 59

## 2014-10-02 VITALS — BP 151/94 | HR 93 | Temp 98.1°F | Resp 16 | Ht 64.0 in | Wt 178.0 lb

## 2014-10-02 DIAGNOSIS — C9000 Multiple myeloma not having achieved remission: Secondary | ICD-10-CM

## 2014-10-02 DIAGNOSIS — Z5112 Encounter for antineoplastic immunotherapy: Secondary | ICD-10-CM

## 2014-10-02 LAB — CBC WITH DIFFERENTIAL (CANCER CENTER ONLY)
BASO#: 0 10*3/uL (ref 0.0–0.2)
BASO%: 0 % (ref 0.0–2.0)
EOS ABS: 0.1 10*3/uL (ref 0.0–0.5)
EOS%: 1.5 % (ref 0.0–7.0)
HCT: 36.8 % (ref 34.8–46.6)
HEMOGLOBIN: 12.4 g/dL (ref 11.6–15.9)
LYMPH#: 2.8 10*3/uL (ref 0.9–3.3)
LYMPH%: 52.1 % — AB (ref 14.0–48.0)
MCH: 35.7 pg — AB (ref 26.0–34.0)
MCHC: 33.7 g/dL (ref 32.0–36.0)
MCV: 106 fL — AB (ref 81–101)
MONO#: 0.6 10*3/uL (ref 0.1–0.9)
MONO%: 10.7 % (ref 0.0–13.0)
NEUT#: 1.9 10*3/uL (ref 1.5–6.5)
NEUT%: 35.7 % — ABNORMAL LOW (ref 39.6–80.0)
PLATELETS: 169 10*3/uL (ref 145–400)
RBC: 3.47 10*6/uL — AB (ref 3.70–5.32)
RDW: 12.1 % (ref 11.1–15.7)
WBC: 5.4 10*3/uL (ref 3.9–10.0)

## 2014-10-02 LAB — CMP (CANCER CENTER ONLY)
ALBUMIN: 3.6 g/dL (ref 3.3–5.5)
ALK PHOS: 97 U/L — AB (ref 26–84)
ALT(SGPT): 16 U/L (ref 10–47)
AST: 23 U/L (ref 11–38)
BUN: 15 mg/dL (ref 7–22)
CALCIUM: 8.9 mg/dL (ref 8.0–10.3)
CO2: 21 mEq/L (ref 18–33)
Chloride: 105 mEq/L (ref 98–108)
Creat: 1.1 mg/dl (ref 0.6–1.2)
Glucose, Bld: 136 mg/dL — ABNORMAL HIGH (ref 73–118)
POTASSIUM: 3.5 meq/L (ref 3.3–4.7)
SODIUM: 140 meq/L (ref 128–145)
TOTAL PROTEIN: 6.8 g/dL (ref 6.4–8.1)
Total Bilirubin: 0.4 mg/dl (ref 0.20–1.60)

## 2014-10-02 MED ORDER — HEPARIN SOD (PORK) LOCK FLUSH 100 UNIT/ML IV SOLN
500.0000 [IU] | Freq: Once | INTRAVENOUS | Status: AC | PRN
  Administered 2014-10-02: 500 [IU]
  Filled 2014-10-02: qty 5

## 2014-10-02 MED ORDER — ONDANSETRON HCL 8 MG PO TABS
8.0000 mg | ORAL_TABLET | Freq: Once | ORAL | Status: AC
  Administered 2014-10-02: 8 mg via ORAL

## 2014-10-02 MED ORDER — ZOLEDRONIC ACID 4 MG/100ML IV SOLN
4.0000 mg | Freq: Once | INTRAVENOUS | Status: AC
  Administered 2014-10-02: 4 mg via INTRAVENOUS
  Filled 2014-10-02: qty 100

## 2014-10-02 MED ORDER — SODIUM CHLORIDE 0.9 % IJ SOLN
10.0000 mL | INTRAMUSCULAR | Status: DC | PRN
  Administered 2014-10-02: 10 mL
  Filled 2014-10-02: qty 10

## 2014-10-02 MED ORDER — ONDANSETRON HCL 8 MG PO TABS
ORAL_TABLET | ORAL | Status: AC
  Filled 2014-10-02: qty 1

## 2014-10-02 MED ORDER — BORTEZOMIB CHEMO SQ INJECTION 3.5 MG (2.5MG/ML)
1.3000 mg/m2 | Freq: Once | INTRAMUSCULAR | Status: AC
  Administered 2014-10-02: 2.5 mg via SUBCUTANEOUS
  Filled 2014-10-02: qty 2.5

## 2014-10-02 NOTE — Progress Notes (Signed)
Hematology and Oncology Follow Up Visit  Akari Defelice [id number] 1966/05/20 48 y.o. 10/02/2014   Principle Diagnosis:   IgG kappa myeloma   HIV-positive  Current Therapy:    S/p BMT - Autologous at [facility] in 05/2014     Interim History:  Ms.  Samad is back for followup. She got off from work. 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 She will have to work on Christmas day.  She was last seen at [facility] about 2 months ago. They recommended Velcade for maintenance therapy. I think this is reasonable.  Her HIV has not been much of a problem. She sees her HIV doctor in town. He actually has cut back on some of her medications.   She's had no problem with fever. She's had no rashes. She's had no nausea or vomiting. She's had no abdominal pain. There's been no cough. She's had no leg swelling. She's had no headache.  Currently, her performance status is ECOG 1.  Medications: Current outpatient prescriptions: Abacavir-Dolutegravir-Lamivud 600-50-300 MG TABS, Take 1 tablet by mouth every morning., Disp: 30 tablet, Rfl: 5;  calcium carbonate (OS-CAL) 600 MG TABS tablet, Take 600 mg by mouth daily with breakfast., Disp: , Rfl: ;  cholecalciferol (VITAMIN D) 1000 UNITS tablet, Take 1 tablet (1,000 Units total) by mouth daily., Disp: 30 tablet, Rfl: 3 docusate sodium (COLACE) 100 MG capsule, Take 100 mg by mouth 2 (two) times daily., Disp: , Rfl: ;  lidocaine-prilocaine (EMLA) cream, Apply topically as needed. Apply to portacath one hour before chemo access or blood draw., Disp: 30 g, Rfl: 3;  ondansetron (ZOFRAN) 8 MG tablet, Take 8 mg by mouth every 8 (eight) hours as needed. , Disp: , Rfl: ;  polyethylene glycol (MIRALAX / GLYCOLAX) packet, Take 17 g by mouth as needed. , Disp: , Rfl:  TraMADol HCl 50 MG TBDP, Take by mouth every 6 (six) hours as needed., Disp: , Rfl: ;  valACYclovir (VALTREX) 500 MG tablet, Take 1 tablet (500 mg total) by mouth daily., Disp: 30 tablet, Rfl:  4 No current facility-administered medications for this visit. Facility-Administered Medications Ordered in Other Visits: bortezomib SQ (VELCADE) chemo injection 2.5 mg, 1.3 mg/m2 (Treatment Plan Actual), Subcutaneous, Once, Volanda Napoleon, MD;  heparin lock flush 100 unit/mL, 500 Units, Intracatheter, Once PRN, Volanda Napoleon, MD;  sodium chloride 0.9 % injection 10 mL, 10 mL, Intracatheter, PRN, Volanda Napoleon, MD  Allergies:  Allergies  Allergen Reactions  . Aspirin     Upset stomach   . Efavirenz Hives and Rash    (brand name sustiva)    Past Medical History, Surgical history, Social history, and Family History were reviewed and updated.  Review of Systems: As above  Physical Exam:  height is 5\' 4"  (1.626 m) and weight is 178 lb (80.74 kg). Her oral temperature is 98.1 F (36.7 C). Her blood pressure is 151/94 and her pulse is 93. Her respiration is 16.   [name]. Head and exam shows no ocular or oral lesions. There are no palpable cervical or supraclavicular lymph nodes. Lungs are clear. Cardiac exam regular rate and rhythm with no murmurs rubs or bruits. Abdomen soft. She has good bowel sounds. There is no fluid wave. There is no palpable liver or spleen tip. Back exam no tenderness over the spine, ribs or hips. Extremities shows no clubbing, cyanosis or edema. Neurological exam shows no focal neurological deficits. Skin exam no rashes, ecchymosis or  petechia.  Lab Results  Component Value Date   WBC 5.4 10/02/2014   HGB 12.4 10/02/2014   HCT 36.8 10/02/2014   MCV 106* 10/02/2014   PLT 169 10/02/2014     Chemistry      Component Value Date/Time   NA 140 10/02/2014 0850   NA 143 09/02/2014 1411   NA 141 04/04/2014 0852   K 3.5 10/02/2014 0850   K 3.6 09/02/2014 1411   K 4.2 04/04/2014 0852   CL 105 10/02/2014 0850   CL 110 09/02/2014 1411   CL 111* 03/29/2013 1351   CO2 21 10/02/2014 0850   CO2 19 09/02/2014 1411   CO2 22  04/04/2014 0852   BUN 15 10/02/2014 0850   BUN 14 09/02/2014 1411   BUN 20.9 04/04/2014 0852   CREATININE 1.1 10/02/2014 0850   CREATININE 1.11* 09/02/2014 1411   CREATININE 1.2* 04/04/2014 0852      Component Value Date/Time   CALCIUM 8.9 10/02/2014 0850   CALCIUM 9.3 09/02/2014 1411   CALCIUM 9.4 04/04/2014 0852   ALKPHOS 97* 10/02/2014 0850   ALKPHOS 86 09/02/2014 1411   ALKPHOS 94 04/04/2014 0852   AST 23 10/02/2014 0850   AST 22 09/02/2014 1411   AST 20 04/04/2014 0852   ALT 16 10/02/2014 0850   ALT 18 09/02/2014 1411   ALT 17 04/04/2014 0852   BILITOT 0.40 10/02/2014 0850   BILITOT 0.5 09/02/2014 1411   BILITOT 0.23 04/04/2014 0852         Impression and Plan: Ms. Michelsen is 48 year old African [place] female. She has IgG kappa myeloma. She ultimately underwent a stem cell transplant. This was back on August 14.  She is on maintenance therapy with Velcade.. This was suggested by her transplant doctors down at [facility]. They recommended every other week Velcade. She is not too happy about this because of the discomfort with Velcade injections.  The FDA just approved a oral form of Velcade. This might be something that we could look into.   She will need Zometa. We will give her a dose today.  I think we probably can do this every 3 months. We will recheck her blood counts every couple weeks.  I will see her back in one month.  Volanda Napoleon, MD 12/24/20159:48 AM

## 2014-10-02 NOTE — Patient Instructions (Signed)
[facility] Discharge Instructions for Patients Receiving Chemotherapy  Today you received the following chemotherapy agents: Velcade.  To help prevent nausea and vomiting after your treatment, we encourage you to take your nausea medication as prescribed.   If you develop nausea and vomiting that is not controlled by your nausea medication, call the clinic.   BELOW ARE SYMPTOMS THAT SHOULD BE REPORTED IMMEDIATELY:  *FEVER GREATER THAN 100.5 F  *CHILLS WITH OR WITHOUT FEVER  NAUSEA AND VOMITING THAT IS NOT CONTROLLED WITH YOUR NAUSEA MEDICATION  *UNUSUAL SHORTNESS OF BREATH  *UNUSUAL BRUISING OR BLEEDING  TENDERNESS IN MOUTH AND THROAT WITH OR WITHOUT PRESENCE OF ULCERS  *URINARY PROBLEMS  *BOWEL PROBLEMS  UNUSUAL RASH Items with * indicate a potential emergency and should be followed up as soon as possible.  Feel free to call the clinic you have any questions or concerns. The clinic phone number is [phone].    

## 2014-10-07 LAB — PROTEIN ELECTROPHORESIS, SERUM, WITH REFLEX
ALBUMIN ELP: 58.7 % (ref 55.8–66.1)
ALPHA-1-GLOBULIN: 4.7 % (ref 2.9–4.9)
ALPHA-2-GLOBULIN: 13.7 % — AB (ref 7.1–11.8)
Beta 2: 3.7 % (ref 3.2–6.5)
Beta Globulin: 6.4 % (ref 4.7–7.2)
GAMMA GLOBULIN: 12.8 % (ref 11.1–18.8)
M-Spike, %: 0.2 g/dL
Total Protein, Serum Electrophoresis: 6.6 g/dL (ref 6.0–8.3)

## 2014-10-07 LAB — IGG, IGA, IGM
IGG (IMMUNOGLOBIN G), SERUM: 865 mg/dL (ref 690–1700)
IGM, SERUM: 16 mg/dL — AB (ref 52–322)
IgA: 33 mg/dL — ABNORMAL LOW (ref 69–380)

## 2014-10-07 LAB — KAPPA/LAMBDA LIGHT CHAINS
KAPPA FREE LGHT CHN: 1.38 mg/dL (ref 0.33–1.94)
KAPPA LAMBDA RATIO: 2.46 — AB (ref 0.26–1.65)
LAMBDA FREE LGHT CHN: 0.56 mg/dL — AB (ref 0.57–2.63)

## 2014-10-07 LAB — IFE INTERPRETATION

## 2014-10-13 ENCOUNTER — Encounter: Payer: Self-pay | Admitting: Infectious Disease

## 2014-10-13 ENCOUNTER — Telehealth: Payer: Self-pay | Admitting: Hematology & Oncology

## 2014-10-13 NOTE — Telephone Encounter (Signed)
Pt moved her appointments to 1-8,1-21 and 2-5

## 2014-10-14 ENCOUNTER — Other Ambulatory Visit: Payer: Self-pay

## 2014-10-16 ENCOUNTER — Other Ambulatory Visit: Payer: Self-pay | Admitting: Lab

## 2014-10-16 ENCOUNTER — Ambulatory Visit: Payer: Self-pay

## 2014-10-17 ENCOUNTER — Ambulatory Visit (HOSPITAL_BASED_OUTPATIENT_CLINIC_OR_DEPARTMENT_OTHER): Payer: Commercial Managed Care - PPO

## 2014-10-17 ENCOUNTER — Ambulatory Visit (HOSPITAL_BASED_OUTPATIENT_CLINIC_OR_DEPARTMENT_OTHER): Payer: Commercial Managed Care - PPO | Admitting: Lab

## 2014-10-17 VITALS — BP 145/80 | HR 93 | Temp 98.0°F | Resp 16

## 2014-10-17 DIAGNOSIS — C9000 Multiple myeloma not having achieved remission: Secondary | ICD-10-CM

## 2014-10-17 DIAGNOSIS — Z5112 Encounter for antineoplastic immunotherapy: Secondary | ICD-10-CM

## 2014-10-17 LAB — CBC WITH DIFFERENTIAL (CANCER CENTER ONLY)
BASO#: 0 10*3/uL (ref 0.0–0.2)
BASO%: 0.4 % (ref 0.0–2.0)
EOS%: 1.3 % (ref 0.0–7.0)
Eosinophils Absolute: 0.1 10*3/uL (ref 0.0–0.5)
HCT: 37.1 % (ref 34.8–46.6)
HGB: 12.5 g/dL (ref 11.6–15.9)
LYMPH#: 2.1 10*3/uL (ref 0.9–3.3)
LYMPH%: 45.1 % (ref 14.0–48.0)
MCH: 35.5 pg — ABNORMAL HIGH (ref 26.0–34.0)
MCHC: 33.7 g/dL (ref 32.0–36.0)
MCV: 105 fL — ABNORMAL HIGH (ref 81–101)
MONO#: 0.4 10*3/uL (ref 0.1–0.9)
MONO%: 9.2 % (ref 0.0–13.0)
NEUT%: 44 % (ref 39.6–80.0)
NEUTROS ABS: 2 10*3/uL (ref 1.5–6.5)
Platelets: 180 10*3/uL (ref 145–400)
RBC: 3.52 10*6/uL — AB (ref 3.70–5.32)
RDW: 12.2 % (ref 11.1–15.7)
WBC: 4.6 10*3/uL (ref 3.9–10.0)

## 2014-10-17 LAB — CMP (CANCER CENTER ONLY)
ALK PHOS: 81 U/L (ref 26–84)
ALT(SGPT): 18 U/L (ref 10–47)
AST: 24 U/L (ref 11–38)
Albumin: 3.6 g/dL (ref 3.3–5.5)
BUN, Bld: 14 mg/dL (ref 7–22)
CHLORIDE: 104 meq/L (ref 98–108)
CO2: 21 meq/L (ref 18–33)
Calcium: 9.1 mg/dL (ref 8.0–10.3)
Creat: 0.9 mg/dl (ref 0.6–1.2)
Glucose, Bld: 124 mg/dL — ABNORMAL HIGH (ref 73–118)
Potassium: 3.8 mEq/L (ref 3.3–4.7)
SODIUM: 139 meq/L (ref 128–145)
TOTAL PROTEIN: 6.9 g/dL (ref 6.4–8.1)
Total Bilirubin: 0.5 mg/dl (ref 0.20–1.60)

## 2014-10-17 MED ORDER — HEPARIN SOD (PORK) LOCK FLUSH 100 UNIT/ML IV SOLN
500.0000 [IU] | INTRAVENOUS | Status: AC | PRN
  Administered 2014-10-17: 500 [IU]
  Filled 2014-10-17: qty 5

## 2014-10-17 MED ORDER — BORTEZOMIB CHEMO SQ INJECTION 3.5 MG (2.5MG/ML)
1.3000 mg/m2 | Freq: Once | INTRAMUSCULAR | Status: AC
  Administered 2014-10-17: 2.5 mg via SUBCUTANEOUS
  Filled 2014-10-17: qty 2.5

## 2014-10-17 MED ORDER — ONDANSETRON HCL 8 MG PO TABS
ORAL_TABLET | ORAL | Status: AC
  Filled 2014-10-17: qty 1

## 2014-10-17 MED ORDER — SODIUM CHLORIDE 0.9 % IJ SOLN
10.0000 mL | INTRAMUSCULAR | Status: AC | PRN
  Administered 2014-10-17: 10 mL
  Filled 2014-10-17: qty 10

## 2014-10-17 MED ORDER — ONDANSETRON HCL 8 MG PO TABS
8.0000 mg | ORAL_TABLET | Freq: Once | ORAL | Status: AC
  Administered 2014-10-17: 8 mg via ORAL

## 2014-10-17 NOTE — Patient Instructions (Signed)
Bortezomib injection What is this medicine? BORTEZOMIB (bor TEZ oh mib) is a chemotherapy drug. It slows the growth of cancer cells. This medicine is used to treat multiple myeloma, and certain lymphomas, such as mantle-cell lymphoma. This medicine may be used for other purposes; ask your health care provider or pharmacist if you have questions. COMMON BRAND NAME(S): Velcade What should I tell my health care provider before I take this medicine? They need to know if you have any of these conditions: -diabetes -heart disease -irregular heartbeat -liver disease -on hemodialysis -low blood counts, like low white blood cells, platelets, or hemoglobin -peripheral neuropathy -taking medicine for blood pressure -an unusual or allergic reaction to bortezomib, mannitol, boron, other medicines, foods, dyes, or preservatives -pregnant or trying to get pregnant -breast-feeding How should I use this medicine? This medicine is for injection into a vein or for injection under the skin. It is given by a health care professional in a hospital or clinic setting. Talk to your pediatrician regarding the use of this medicine in children. Special care may be needed. Overdosage: If you think you have taken too much of this medicine contact a poison control center or emergency room at once. NOTE: This medicine is only for you. Do not share this medicine with others. What if I miss a dose? It is important not to miss your dose. Call your doctor or health care professional if you are unable to keep an appointment. What may interact with this medicine? This medicine may interact with the following medications: -ketoconazole -rifampin -ritonavir -St. John's Wort This list may not describe all possible interactions. Give your health care provider a list of all the medicines, herbs, non-prescription drugs, or dietary supplements you use. Also tell them if you smoke, drink alcohol, or use illegal drugs. Some items  may interact with your medicine. What should I watch for while using this medicine? Visit your doctor for checks on your progress. This drug may make you feel generally unwell. This is not uncommon, as chemotherapy can affect healthy cells as well as cancer cells. Report any side effects. Continue your course of treatment even though you feel ill unless your doctor tells you to stop. You may get drowsy or dizzy. Do not drive, use machinery, or do anything that needs mental alertness until you know how this medicine affects you. Do not stand or sit up quickly, especially if you are an older patient. This reduces the risk of dizzy or fainting spells. In some cases, you may be given additional medicines to help with side effects. Follow all directions for their use. Call your doctor or health care professional for advice if you get a fever, chills or sore throat, or other symptoms of a cold or flu. Do not treat yourself. This drug decreases your body's ability to fight infections. Try to avoid being around people who are sick. This medicine may increase your risk to bruise or bleed. Call your doctor or health care professional if you notice any unusual bleeding. You may need blood work done while you are taking this medicine. In some patients, this medicine may cause a serious brain infection that may cause death. If you have any problems seeing, thinking, speaking, walking, or standing, tell your doctor right away. If you cannot reach your doctor, urgently seek other source of medical care. Do not become pregnant while taking this medicine. Women should inform their doctor if they wish to become pregnant or think they might be pregnant. There is   a potential for serious side effects to an unborn child. Talk to your health care professional or pharmacist for more information. Do not breast-feed an infant while taking this medicine. Check with your doctor or health care professional if you get an attack of  severe diarrhea, nausea and vomiting, or if you sweat a lot. The loss of too much body fluid can make it dangerous for you to take this medicine. What side effects may I notice from receiving this medicine? Side effects that you should report to your doctor or health care professional as soon as possible: -allergic reactions like skin rash, itching or hives, swelling of the face, lips, or tongue -breathing problems -changes in hearing -changes in vision -fast, irregular heartbeat -feeling faint or lightheaded, falls -pain, tingling, numbness in the hands or feet -right upper belly pain -seizures -swelling of the ankles, feet, hands -unusual bleeding or bruising -unusually weak or tired -vomiting -yellowing of the eyes or skin Side effects that usually do not require medical attention (report to your doctor or health care professional if they continue or are bothersome): -changes in emotions or moods -constipation -diarrhea -loss of appetite -headache -irritation at site where injected -nausea This list may not describe all possible side effects. Call your doctor for medical advice about side effects. You may report side effects to FDA at 1-800-FDA-1088. Where should I keep my medicine? This drug is given in a hospital or clinic and will not be stored at home. NOTE: This sheet is a summary. It may not cover all possible information. If you have questions about this medicine, talk to your doctor, pharmacist, or health care provider.  2015, Elsevier/Gold Standard. (2013-07-22 12:46:32)  

## 2014-10-22 LAB — IGG, IGA, IGM
IGG (IMMUNOGLOBIN G), SERUM: 752 mg/dL (ref 690–1700)
IgA: 34 mg/dL — ABNORMAL LOW (ref 69–380)
IgM, Serum: 14 mg/dL — ABNORMAL LOW (ref 52–322)

## 2014-10-22 LAB — PROTEIN ELECTROPHORESIS, SERUM, WITH REFLEX
ALBUMIN ELP: 61.2 % (ref 55.8–66.1)
Alpha-1-Globulin: 4.2 % (ref 2.9–4.9)
Alpha-2-Globulin: 11.9 % — ABNORMAL HIGH (ref 7.1–11.8)
Beta 2: 4.2 % (ref 3.2–6.5)
Beta Globulin: 5.6 % (ref 4.7–7.2)
Gamma Globulin: 12.9 % (ref 11.1–18.8)
M-SPIKE, %: 0.21 g/dL
TOTAL PROTEIN, SERUM ELECTROPHOR: 6.7 g/dL (ref 6.0–8.3)

## 2014-10-22 LAB — KAPPA/LAMBDA LIGHT CHAINS
KAPPA LAMBDA RATIO: 1.99 — AB (ref 0.26–1.65)
Kappa free light chain: 3.57 mg/dL — ABNORMAL HIGH (ref 0.33–1.94)
LAMBDA FREE LGHT CHN: 1.79 mg/dL (ref 0.57–2.63)

## 2014-10-22 LAB — IFE INTERPRETATION

## 2014-10-29 ENCOUNTER — Other Ambulatory Visit: Payer: Self-pay | Admitting: Licensed Clinical Social Worker

## 2014-10-29 ENCOUNTER — Ambulatory Visit (HOSPITAL_BASED_OUTPATIENT_CLINIC_OR_DEPARTMENT_OTHER): Payer: Commercial Managed Care - PPO

## 2014-10-29 ENCOUNTER — Ambulatory Visit: Payer: Self-pay | Admitting: Infectious Disease

## 2014-10-29 ENCOUNTER — Telehealth: Payer: Self-pay

## 2014-10-29 ENCOUNTER — Other Ambulatory Visit (HOSPITAL_BASED_OUTPATIENT_CLINIC_OR_DEPARTMENT_OTHER): Payer: Commercial Managed Care - PPO | Admitting: Lab

## 2014-10-29 DIAGNOSIS — C9000 Multiple myeloma not having achieved remission: Secondary | ICD-10-CM

## 2014-10-29 DIAGNOSIS — Z5112 Encounter for antineoplastic immunotherapy: Secondary | ICD-10-CM

## 2014-10-29 DIAGNOSIS — B20 Human immunodeficiency virus [HIV] disease: Secondary | ICD-10-CM

## 2014-10-29 DIAGNOSIS — C9001 Multiple myeloma in remission: Secondary | ICD-10-CM

## 2014-10-29 LAB — CBC WITH DIFFERENTIAL (CANCER CENTER ONLY)
BASO#: 0 10*3/uL (ref 0.0–0.2)
BASO%: 0.2 % (ref 0.0–2.0)
EOS ABS: 0 10*3/uL (ref 0.0–0.5)
EOS%: 0.8 % (ref 0.0–7.0)
HCT: 36.9 % (ref 34.8–46.6)
HGB: 12.4 g/dL (ref 11.6–15.9)
LYMPH#: 2.4 10*3/uL (ref 0.9–3.3)
LYMPH%: 45 % (ref 14.0–48.0)
MCH: 35 pg — AB (ref 26.0–34.0)
MCHC: 33.6 g/dL (ref 32.0–36.0)
MCV: 104 fL — AB (ref 81–101)
MONO#: 0.4 10*3/uL (ref 0.1–0.9)
MONO%: 7.4 % (ref 0.0–13.0)
NEUT%: 46.6 % (ref 39.6–80.0)
NEUTROS ABS: 2.5 10*3/uL (ref 1.5–6.5)
PLATELETS: 173 10*3/uL (ref 145–400)
RBC: 3.54 10*6/uL — ABNORMAL LOW (ref 3.70–5.32)
RDW: 12.5 % (ref 11.1–15.7)
WBC: 5.3 10*3/uL (ref 3.9–10.0)

## 2014-10-29 MED ORDER — ONDANSETRON HCL 8 MG PO TABS
ORAL_TABLET | ORAL | Status: AC
  Filled 2014-10-29: qty 1

## 2014-10-29 MED ORDER — ABACAVIR-DOLUTEGRAVIR-LAMIVUD 600-50-300 MG PO TABS: 1.0000 | ORAL_TABLET | Freq: Every morning | ORAL | Status: DC

## 2014-10-29 MED ORDER — ONDANSETRON HCL 8 MG PO TABS
8.0000 mg | ORAL_TABLET | Freq: Once | ORAL | Status: AC
  Administered 2014-10-29: 8 mg via ORAL

## 2014-10-29 MED ORDER — BORTEZOMIB CHEMO SQ INJECTION 3.5 MG (2.5MG/ML)
1.3000 mg/m2 | Freq: Once | INTRAMUSCULAR | Status: AC
  Administered 2014-10-29: 2.5 mg via SUBCUTANEOUS
  Filled 2014-10-29: qty 2.5

## 2014-10-29 NOTE — Telephone Encounter (Signed)
Patient is having problems paying co pay for Triumeq due to high deductable with current insurance.  She is here today for assistance and given a VIV co-pay card.  Patient advised she should activate card and medication sent to [facility] on [address].   Laverle Patter, RN

## 2014-10-29 NOTE — Patient Instructions (Signed)
Bortezomib injection What is this medicine? BORTEZOMIB (bor TEZ oh mib) is a chemotherapy drug. It slows the growth of cancer cells. This medicine is used to treat multiple myeloma, and certain lymphomas, such as mantle-cell lymphoma. This medicine may be used for other purposes; ask your health care provider or pharmacist if you have questions. COMMON BRAND NAME(S): Velcade What should I tell my health care provider before I take this medicine? They need to know if you have any of these conditions: -diabetes -heart disease -irregular heartbeat -liver disease -on hemodialysis -low blood counts, like low white blood cells, platelets, or hemoglobin -peripheral neuropathy -taking medicine for blood pressure -an unusual or allergic reaction to bortezomib, mannitol, boron, other medicines, foods, dyes, or preservatives -pregnant or trying to get pregnant -breast-feeding How should I use this medicine? This medicine is for injection into a vein or for injection under the skin. It is given by a health care professional in a hospital or clinic setting. Talk to your pediatrician regarding the use of this medicine in children. Special care may be needed. Overdosage: If you think you have taken too much of this medicine contact a poison control center or emergency room at once. NOTE: This medicine is only for you. Do not share this medicine with others. What if I miss a dose? It is important not to miss your dose. Call your doctor or health care professional if you are unable to keep an appointment. What may interact with this medicine? This medicine may interact with the following medications: -ketoconazole -rifampin -ritonavir -St. John's Wort This list may not describe all possible interactions. Give your health care provider a list of all the medicines, herbs, non-prescription drugs, or dietary supplements you use. Also tell them if you smoke, drink alcohol, or use illegal drugs. Some items  may interact with your medicine. What should I watch for while using this medicine? Visit your doctor for checks on your progress. This drug may make you feel generally unwell. This is not uncommon, as chemotherapy can affect healthy cells as well as cancer cells. Report any side effects. Continue your course of treatment even though you feel ill unless your doctor tells you to stop. You may get drowsy or dizzy. Do not drive, use machinery, or do anything that needs mental alertness until you know how this medicine affects you. Do not stand or sit up quickly, especially if you are an older patient. This reduces the risk of dizzy or fainting spells. In some cases, you may be given additional medicines to help with side effects. Follow all directions for their use. Call your doctor or health care professional for advice if you get a fever, chills or sore throat, or other symptoms of a cold or flu. Do not treat yourself. This drug decreases your body's ability to fight infections. Try to avoid being around people who are sick. This medicine may increase your risk to bruise or bleed. Call your doctor or health care professional if you notice any unusual bleeding. You may need blood work done while you are taking this medicine. In some patients, this medicine may cause a serious brain infection that may cause death. If you have any problems seeing, thinking, speaking, walking, or standing, tell your doctor right away. If you cannot reach your doctor, urgently seek other source of medical care. Do not become pregnant while taking this medicine. Women should inform their doctor if they wish to become pregnant or think they might be pregnant. There is   a potential for serious side effects to an unborn child. Talk to your health care professional or pharmacist for more information. Do not breast-feed an infant while taking this medicine. Check with your doctor or health care professional if you get an attack of  severe diarrhea, nausea and vomiting, or if you sweat a lot. The loss of too much body fluid can make it dangerous for you to take this medicine. What side effects may I notice from receiving this medicine? Side effects that you should report to your doctor or health care professional as soon as possible: -allergic reactions like skin rash, itching or hives, swelling of the face, lips, or tongue -breathing problems -changes in hearing -changes in vision -fast, irregular heartbeat -feeling faint or lightheaded, falls -pain, tingling, numbness in the hands or feet -right upper belly pain -seizures -swelling of the ankles, feet, hands -unusual bleeding or bruising -unusually weak or tired -vomiting -yellowing of the eyes or skin Side effects that usually do not require medical attention (report to your doctor or health care professional if they continue or are bothersome): -changes in emotions or moods -constipation -diarrhea -loss of appetite -headache -irritation at site where injected -nausea This list may not describe all possible side effects. Call your doctor for medical advice about side effects. You may report side effects to FDA at 1-800-FDA-1088. Where should I keep my medicine? This drug is given in a hospital or clinic and will not be stored at home. NOTE: This sheet is a summary. It may not cover all possible information. If you have questions about this medicine, talk to your doctor, pharmacist, or health care provider.  2015, Elsevier/Gold Standard. (2013-07-22 12:46:32)  

## 2014-10-30 ENCOUNTER — Ambulatory Visit: Payer: Self-pay

## 2014-10-30 ENCOUNTER — Other Ambulatory Visit: Payer: Self-pay | Admitting: Lab

## 2014-10-30 LAB — COMPREHENSIVE METABOLIC PANEL
ALBUMIN: 3.7 g/dL (ref 3.5–5.2)
ALK PHOS: 83 U/L (ref 39–117)
ALT: 16 U/L (ref 0–35)
AST: 20 U/L (ref 0–37)
BUN: 12 mg/dL (ref 6–23)
CO2: 23 mEq/L (ref 19–32)
CREATININE: 0.98 mg/dL (ref 0.50–1.10)
Calcium: 9.1 mg/dL (ref 8.4–10.5)
Chloride: 111 mEq/L (ref 96–112)
GLUCOSE: 126 mg/dL — AB (ref 70–99)
Potassium: 3.7 mEq/L (ref 3.5–5.3)
SODIUM: 143 meq/L (ref 135–145)
Total Bilirubin: 0.4 mg/dL (ref 0.2–1.2)
Total Protein: 6.2 g/dL (ref 6.0–8.3)

## 2014-10-30 LAB — LACTATE DEHYDROGENASE: LDH: 195 U/L (ref 94–250)

## 2014-11-13 ENCOUNTER — Ambulatory Visit: Payer: Self-pay | Admitting: Hematology & Oncology

## 2014-11-13 ENCOUNTER — Other Ambulatory Visit: Payer: Self-pay | Admitting: Lab

## 2014-11-13 ENCOUNTER — Ambulatory Visit: Payer: Self-pay

## 2014-11-14 ENCOUNTER — Other Ambulatory Visit (HOSPITAL_BASED_OUTPATIENT_CLINIC_OR_DEPARTMENT_OTHER): Payer: Commercial Managed Care - PPO | Admitting: Lab

## 2014-11-14 ENCOUNTER — Ambulatory Visit (HOSPITAL_BASED_OUTPATIENT_CLINIC_OR_DEPARTMENT_OTHER): Payer: Commercial Managed Care - PPO | Admitting: Hematology & Oncology

## 2014-11-14 ENCOUNTER — Encounter: Payer: Self-pay | Admitting: Hematology & Oncology

## 2014-11-14 ENCOUNTER — Ambulatory Visit (HOSPITAL_BASED_OUTPATIENT_CLINIC_OR_DEPARTMENT_OTHER): Payer: Commercial Managed Care - PPO

## 2014-11-14 VITALS — BP 139/89 | HR 91 | Temp 98.2°F | Resp 16 | Wt 178.0 lb

## 2014-11-14 DIAGNOSIS — C9000 Multiple myeloma not having achieved remission: Secondary | ICD-10-CM

## 2014-11-14 DIAGNOSIS — C9001 Multiple myeloma in remission: Secondary | ICD-10-CM

## 2014-11-14 DIAGNOSIS — Z5112 Encounter for antineoplastic immunotherapy: Secondary | ICD-10-CM

## 2014-11-14 DIAGNOSIS — Z452 Encounter for adjustment and management of vascular access device: Secondary | ICD-10-CM

## 2014-11-14 LAB — COMPREHENSIVE METABOLIC PANEL
ALT: 17 U/L (ref 0–35)
AST: 23 U/L (ref 0–37)
Albumin: 4.2 g/dL (ref 3.5–5.2)
Alkaline Phosphatase: 90 U/L (ref 39–117)
BUN: 20 mg/dL (ref 6–23)
CHLORIDE: 105 meq/L (ref 96–112)
CO2: 19 meq/L (ref 19–32)
Calcium: 9.1 mg/dL (ref 8.4–10.5)
Creatinine, Ser: 1.08 mg/dL (ref 0.50–1.10)
Glucose, Bld: 164 mg/dL — ABNORMAL HIGH (ref 70–99)
Potassium: 4 mEq/L (ref 3.5–5.3)
Sodium: 140 mEq/L (ref 135–145)
Total Bilirubin: 0.5 mg/dL (ref 0.2–1.2)
Total Protein: 6.6 g/dL (ref 6.0–8.3)

## 2014-11-14 LAB — CBC WITH DIFFERENTIAL (CANCER CENTER ONLY)
BASO#: 0 10*3/uL (ref 0.0–0.2)
BASO%: 0.2 % (ref 0.0–2.0)
EOS%: 1.5 % (ref 0.0–7.0)
Eosinophils Absolute: 0.1 10*3/uL (ref 0.0–0.5)
HEMATOCRIT: 36.3 % (ref 34.8–46.6)
HGB: 12.4 g/dL (ref 11.6–15.9)
LYMPH#: 2.2 10*3/uL (ref 0.9–3.3)
LYMPH%: 46.5 % (ref 14.0–48.0)
MCH: 35.5 pg — AB (ref 26.0–34.0)
MCHC: 34.2 g/dL (ref 32.0–36.0)
MCV: 104 fL — ABNORMAL HIGH (ref 81–101)
MONO#: 0.4 10*3/uL (ref 0.1–0.9)
MONO%: 8 % (ref 0.0–13.0)
NEUT#: 2.1 10*3/uL (ref 1.5–6.5)
NEUT%: 43.8 % (ref 39.6–80.0)
Platelets: 182 10*3/uL (ref 145–400)
RBC: 3.49 10*6/uL — ABNORMAL LOW (ref 3.70–5.32)
RDW: 12.6 % (ref 11.1–15.7)
WBC: 4.8 10*3/uL (ref 3.9–10.0)

## 2014-11-14 LAB — LACTATE DEHYDROGENASE: LDH: 233 U/L (ref 94–250)

## 2014-11-14 MED ORDER — HEPARIN SOD (PORK) LOCK FLUSH 100 UNIT/ML IV SOLN
500.0000 [IU] | Freq: Once | INTRAVENOUS | Status: AC | PRN
  Administered 2014-11-14: 500 [IU]
  Filled 2014-11-14: qty 5

## 2014-11-14 MED ORDER — BORTEZOMIB CHEMO SQ INJECTION 3.5 MG (2.5MG/ML)
1.3000 mg/m2 | Freq: Once | INTRAMUSCULAR | Status: AC
  Administered 2014-11-14: 2.5 mg via SUBCUTANEOUS
  Filled 2014-11-14: qty 2.5

## 2014-11-14 MED ORDER — SODIUM CHLORIDE 0.9 % IJ SOLN
10.0000 mL | INTRAMUSCULAR | Status: DC | PRN
  Administered 2014-11-14: 10 mL
  Filled 2014-11-14: qty 10

## 2014-11-14 NOTE — Patient Instructions (Signed)
Bortezomib injection What is this medicine? BORTEZOMIB (bor TEZ oh mib) is a chemotherapy drug. It slows the growth of cancer cells. This medicine is used to treat multiple myeloma, and certain lymphomas, such as mantle-cell lymphoma. This medicine may be used for other purposes; ask your health care provider or pharmacist if you have questions. COMMON BRAND NAME(S): Velcade What should I tell my health care provider before I take this medicine? They need to know if you have any of these conditions: -diabetes -heart disease -irregular heartbeat -liver disease -on hemodialysis -low blood counts, like low white blood cells, platelets, or hemoglobin -peripheral neuropathy -taking medicine for blood pressure -an unusual or allergic reaction to bortezomib, mannitol, boron, other medicines, foods, dyes, or preservatives -pregnant or trying to get pregnant -breast-feeding How should I use this medicine? This medicine is for injection into a vein or for injection under the skin. It is given by a health care professional in a hospital or clinic setting. Talk to your pediatrician regarding the use of this medicine in children. Special care may be needed. Overdosage: If you think you have taken too much of this medicine contact a poison control center or emergency room at once. NOTE: This medicine is only for you. Do not share this medicine with others. What if I miss a dose? It is important not to miss your dose. Call your doctor or health care professional if you are unable to keep an appointment. What may interact with this medicine? This medicine may interact with the following medications: -ketoconazole -rifampin -ritonavir -St. John's Wort This list may not describe all possible interactions. Give your health care provider a list of all the medicines, herbs, non-prescription drugs, or dietary supplements you use. Also tell them if you smoke, drink alcohol, or use illegal drugs. Some items  may interact with your medicine. What should I watch for while using this medicine? Visit your doctor for checks on your progress. This drug may make you feel generally unwell. This is not uncommon, as chemotherapy can affect healthy cells as well as cancer cells. Report any side effects. Continue your course of treatment even though you feel ill unless your doctor tells you to stop. You may get drowsy or dizzy. Do not drive, use machinery, or do anything that needs mental alertness until you know how this medicine affects you. Do not stand or sit up quickly, especially if you are an older patient. This reduces the risk of dizzy or fainting spells. In some cases, you may be given additional medicines to help with side effects. Follow all directions for their use. Call your doctor or health care professional for advice if you get a fever, chills or sore throat, or other symptoms of a cold or flu. Do not treat yourself. This drug decreases your body's ability to fight infections. Try to avoid being around people who are sick. This medicine may increase your risk to bruise or bleed. Call your doctor or health care professional if you notice any unusual bleeding. You may need blood work done while you are taking this medicine. In some patients, this medicine may cause a serious brain infection that may cause death. If you have any problems seeing, thinking, speaking, walking, or standing, tell your doctor right away. If you cannot reach your doctor, urgently seek other source of medical care. Do not become pregnant while taking this medicine. Women should inform their doctor if they wish to become pregnant or think they might be pregnant. There is   a potential for serious side effects to an unborn child. Talk to your health care professional or pharmacist for more information. Do not breast-feed an infant while taking this medicine. Check with your doctor or health care professional if you get an attack of  severe diarrhea, nausea and vomiting, or if you sweat a lot. The loss of too much body fluid can make it dangerous for you to take this medicine. What side effects may I notice from receiving this medicine? Side effects that you should report to your doctor or health care professional as soon as possible: -allergic reactions like skin rash, itching or hives, swelling of the face, lips, or tongue -breathing problems -changes in hearing -changes in vision -fast, irregular heartbeat -feeling faint or lightheaded, falls -pain, tingling, numbness in the hands or feet -right upper belly pain -seizures -swelling of the ankles, feet, hands -unusual bleeding or bruising -unusually weak or tired -vomiting -yellowing of the eyes or skin Side effects that usually do not require medical attention (report to your doctor or health care professional if they continue or are bothersome): -changes in emotions or moods -constipation -diarrhea -loss of appetite -headache -irritation at site where injected -nausea This list may not describe all possible side effects. Call your doctor for medical advice about side effects. You may report side effects to FDA at 1-800-FDA-1088. Where should I keep my medicine? This drug is given in a hospital or clinic and will not be stored at home. NOTE: This sheet is a summary. It may not cover all possible information. If you have questions about this medicine, talk to your doctor, pharmacist, or health care provider.  2015, Elsevier/Gold Standard. (2013-07-22 12:46:32)  

## 2014-11-14 NOTE — Progress Notes (Signed)
Hematology and Oncology Follow Up Visit  Eyla Tallon [id number] 25-Aug-1966 49 y.o. 11/14/2014   Principle Diagnosis:   IgG kappa myeloma   HIV-positive  Current Therapy:    S/p BMT - Autologous at [facility] in 05/2014  Velcade -every 2 week maintenance dosing     Interim History:  Ms.  Alcindor is back for followup. She got off from work. 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 She feels well. She's had no problem with nausea or vomiting. She's had no bony pain. She's had no cough. There's been no change in bowel or bladder habits.  She has a goes back to [facility] in a couple weeks to see the transplant doctor.  Her last myeloma studies done about a month ago showed a monoclonal spike of 0.21 g/L. Her IgG level was 752 mg/dL. Her Kappa light chain was 3.57 mg/dL.  There's been no fever. She's had no rashes.  Currently, her performance status is ECOG 1.  Medications:  Current outpatient prescriptions:  .  Abacavir-Dolutegravir-Lamivud 600-50-300 MG TABS, Take 1 tablet by mouth every morning., Disp: 30 tablet, Rfl: 5 .  calcium carbonate (OS-CAL) 600 MG TABS tablet, Take 600 mg by mouth daily with breakfast., Disp: , Rfl:  .  cholecalciferol (VITAMIN D) 1000 UNITS tablet, Take 1 tablet (1,000 Units total) by mouth daily., Disp: 30 tablet, Rfl: 3 .  docusate sodium (COLACE) 100 MG capsule, Take 100 mg by mouth 2 (two) times daily., Disp: , Rfl:  .  lidocaine-prilocaine (EMLA) cream, Apply topically as needed. Apply to portacath one hour before chemo access or blood draw., Disp: 30 g, Rfl: 3 .  ondansetron (ZOFRAN) 8 MG tablet, Take 8 mg by mouth every 8 (eight) hours as needed. , Disp: , Rfl:  .  polyethylene glycol (MIRALAX / GLYCOLAX) packet, Take 17 g by mouth as needed. , Disp: , Rfl:  .  TraMADol HCl 50 MG TBDP, Take by mouth every 6 (six) hours as needed., Disp: , Rfl:  .  valACYclovir (VALTREX) 500 MG tablet, Take 1 tablet (500 mg total) by mouth daily.,  Disp: 30 tablet, Rfl: 4  Allergies:  Allergies  Allergen Reactions  . Aspirin     Upset stomach   . Efavirenz Hives and Rash    (brand name sustiva)    Past Medical History, Surgical history, Social history, and Family History were reviewed and updated.  Review of Systems: As above  Physical Exam:  weight is 178 lb (80.74 kg). Her oral temperature is 98.2 F (36.8 C). Her blood pressure is 139/89 and her pulse is 91. Her respiration is 16.   [name]. Head and exam shows no ocular or oral lesions. There are no palpable cervical or supraclavicular lymph nodes. Lungs are clear. Cardiac exam regular rate and rhythm with no murmurs rubs or bruits. Abdomen soft. She has good bowel sounds. There is no fluid wave. There is no palpable liver or spleen tip. Back exam no tenderness over the spine, ribs or hips. Extremities shows no clubbing, cyanosis or edema. Neurological exam shows no focal neurological deficits. Skin exam no rashes, ecchymosis or petechia.  Lab Results  Component Value Date   WBC 4.8 11/14/2014   HGB 12.4 11/14/2014   HCT 36.3 11/14/2014   MCV 104* 11/14/2014   PLT 182 11/14/2014     Chemistry      Component Value Date/Time   NA 143 10/29/2014 1415   NA 139  10/17/2014 0837   NA 141 04/04/2014 0852   K 3.7 10/29/2014 1415   K 3.8 10/17/2014 0837   K 4.2 04/04/2014 0852   CL 111 10/29/2014 1415   CL 104 10/17/2014 0837   CL 111* 03/29/2013 1351   CO2 23 10/29/2014 1415   CO2 21 10/17/2014 0837   CO2 22 04/04/2014 0852   BUN 12 10/29/2014 1415   BUN 14 10/17/2014 0837   BUN 20.9 04/04/2014 0852   CREATININE 0.98 10/29/2014 1415   CREATININE 0.9 10/17/2014 0837   CREATININE 1.2* 04/04/2014 0852      Component Value Date/Time   CALCIUM 9.1 10/29/2014 1415   CALCIUM 9.1 10/17/2014 0837   CALCIUM 9.4 04/04/2014 0852   ALKPHOS 83 10/29/2014 1415   ALKPHOS 81 10/17/2014 0837   ALKPHOS 94 04/04/2014 0852   AST 20  10/29/2014 1415   AST 24 10/17/2014 0837   AST 20 04/04/2014 0852   ALT 16 10/29/2014 1415   ALT 18 10/17/2014 0837   ALT 17 04/04/2014 0852   BILITOT 0.4 10/29/2014 1415   BILITOT 0.50 10/17/2014 0837   BILITOT 0.23 04/04/2014 0852         Impression and Plan: Ms. Cothran is 49 year old African [place] female. She has IgG kappa myeloma. She ultimately underwent a stem cell transplant. This was back on August 14. This was done at [facility].  She is on maintenance therapy with Velcade.. This was suggested by her transplant doctors down at [facility]. They recommended every other week Velcade. She is doing well with this so far.  If she ever decides that the Velcade injections are too much of a problem, then we probably try the oral form of Velcade- [place].  I think we probably can Zometa  every 3 months.   We will recheck her blood counts every couple weeks.  I will see her back in one month.  Volanda Napoleon, MD 2/5/20169:13 AM

## 2014-11-25 ENCOUNTER — Ambulatory Visit (HOSPITAL_BASED_OUTPATIENT_CLINIC_OR_DEPARTMENT_OTHER): Payer: Commercial Managed Care - PPO

## 2014-11-25 ENCOUNTER — Other Ambulatory Visit (HOSPITAL_BASED_OUTPATIENT_CLINIC_OR_DEPARTMENT_OTHER): Payer: Commercial Managed Care - PPO | Admitting: Lab

## 2014-11-25 ENCOUNTER — Other Ambulatory Visit: Payer: Self-pay | Admitting: *Deleted

## 2014-11-25 ENCOUNTER — Telehealth: Payer: Self-pay | Admitting: Hematology & Oncology

## 2014-11-25 DIAGNOSIS — Z5112 Encounter for antineoplastic immunotherapy: Secondary | ICD-10-CM

## 2014-11-25 DIAGNOSIS — C9000 Multiple myeloma not having achieved remission: Secondary | ICD-10-CM

## 2014-11-25 LAB — CBC WITH DIFFERENTIAL (CANCER CENTER ONLY)
BASO#: 0 10*3/uL (ref 0.0–0.2)
BASO%: 0.2 % (ref 0.0–2.0)
EOS%: 1.1 % (ref 0.0–7.0)
Eosinophils Absolute: 0.1 10*3/uL (ref 0.0–0.5)
HCT: 38.2 % (ref 34.8–46.6)
HGB: 12.8 g/dL (ref 11.6–15.9)
LYMPH#: 2.1 10*3/uL (ref 0.9–3.3)
LYMPH%: 39.4 % (ref 14.0–48.0)
MCH: 34.9 pg — ABNORMAL HIGH (ref 26.0–34.0)
MCHC: 33.5 g/dL (ref 32.0–36.0)
MCV: 104 fL — ABNORMAL HIGH (ref 81–101)
MONO#: 0.4 10*3/uL (ref 0.1–0.9)
MONO%: 7.3 % (ref 0.0–13.0)
NEUT%: 52 % (ref 39.6–80.0)
NEUTROS ABS: 2.8 10*3/uL (ref 1.5–6.5)
Platelets: 186 10*3/uL (ref 145–400)
RBC: 3.67 10*6/uL — ABNORMAL LOW (ref 3.70–5.32)
RDW: 13 % (ref 11.1–15.7)
WBC: 5.3 10*3/uL (ref 3.9–10.0)

## 2014-11-25 LAB — COMPREHENSIVE METABOLIC PANEL
ALBUMIN: 3.7 g/dL (ref 3.5–5.2)
ALK PHOS: 85 U/L (ref 39–117)
ALT: 18 U/L (ref 0–35)
AST: 19 U/L (ref 0–37)
BUN: 26 mg/dL — AB (ref 6–23)
CALCIUM: 9.3 mg/dL (ref 8.4–10.5)
CO2: 21 mEq/L (ref 19–32)
CREATININE: 1.25 mg/dL — AB (ref 0.50–1.10)
Chloride: 109 mEq/L (ref 96–112)
Glucose, Bld: 106 mg/dL — ABNORMAL HIGH (ref 70–99)
Potassium: 4 mEq/L (ref 3.5–5.3)
Sodium: 141 mEq/L (ref 135–145)
TOTAL PROTEIN: 6.4 g/dL (ref 6.0–8.3)
Total Bilirubin: 0.4 mg/dL (ref 0.2–1.2)

## 2014-11-25 MED ORDER — SODIUM CHLORIDE 0.9 % IV SOLN: Freq: Once | INTRAVENOUS | Status: DC

## 2014-11-25 MED ORDER — BORTEZOMIB CHEMO SQ INJECTION 3.5 MG (2.5MG/ML)
1.3000 mg/m2 | Freq: Once | INTRAMUSCULAR | Status: AC
  Administered 2014-11-25: 2.5 mg via SUBCUTANEOUS
  Filled 2014-11-25: qty 2.5

## 2014-11-25 MED ORDER — SODIUM CHLORIDE 0.9 % IJ SOLN
10.0000 mL | INTRAMUSCULAR | Status: DC | PRN
  Administered 2014-11-25: 10 mL
  Filled 2014-11-25: qty 10

## 2014-11-25 MED ORDER — HEPARIN SOD (PORK) LOCK FLUSH 100 UNIT/ML IV SOLN
500.0000 [IU] | Freq: Once | INTRAVENOUS | Status: AC | PRN
  Administered 2014-11-25: 500 [IU]
  Filled 2014-11-25: qty 5

## 2014-11-25 NOTE — Patient Instructions (Addendum)
[facility] Discharge Instructions for Patients Receiving Chemotherapy  Today you received the following chemotherapy agents: Velcade. To help prevent nausea and vomiting after your treatment, we encourage you to take your nausea medication.  If you develop nausea and vomiting that is not controlled by your nausea medication, call the clinic.   BELOW ARE SYMPTOMS THAT SHOULD BE REPORTED IMMEDIATELY:  *FEVER GREATER THAN 100.5 F  *CHILLS WITH OR WITHOUT FEVER  NAUSEA AND VOMITING THAT IS NOT CONTROLLED WITH YOUR NAUSEA MEDICATION  *UNUSUAL SHORTNESS OF BREATH  *UNUSUAL BRUISING OR BLEEDING  TENDERNESS IN MOUTH AND THROAT WITH OR WITHOUT PRESENCE OF ULCERS  *URINARY PROBLEMS  *BOWEL PROBLEMS  UNUSUAL RASH Items with * indicate a potential emergency and should be followed up as soon as possible.  Feel free to call the clinic you have any questions or concerns. The clinic phone number is [phone].    

## 2014-11-25 NOTE — Telephone Encounter (Signed)
Patient was here in office today for apt.  Patient cx 12/10/14 apt and resch for 12/09/14.  New calendar was given to patient

## 2014-12-01 ENCOUNTER — Other Ambulatory Visit: Payer: Self-pay | Admitting: Hematology & Oncology

## 2014-12-04 ENCOUNTER — Other Ambulatory Visit: Payer: Self-pay | Admitting: Family

## 2014-12-09 ENCOUNTER — Ambulatory Visit (HOSPITAL_BASED_OUTPATIENT_CLINIC_OR_DEPARTMENT_OTHER): Payer: Commercial Managed Care - PPO

## 2014-12-09 ENCOUNTER — Other Ambulatory Visit: Payer: Self-pay | Admitting: Oncology

## 2014-12-09 ENCOUNTER — Ambulatory Visit: Payer: Self-pay

## 2014-12-09 ENCOUNTER — Encounter: Payer: Self-pay | Admitting: Family

## 2014-12-09 ENCOUNTER — Other Ambulatory Visit: Payer: Self-pay | Admitting: Lab

## 2014-12-09 ENCOUNTER — Ambulatory Visit: Payer: Self-pay | Admitting: Family

## 2014-12-09 ENCOUNTER — Ambulatory Visit (HOSPITAL_BASED_OUTPATIENT_CLINIC_OR_DEPARTMENT_OTHER): Payer: Commercial Managed Care - PPO | Admitting: Lab

## 2014-12-09 ENCOUNTER — Ambulatory Visit (HOSPITAL_BASED_OUTPATIENT_CLINIC_OR_DEPARTMENT_OTHER): Payer: Commercial Managed Care - PPO | Admitting: Family

## 2014-12-09 VITALS — BP 130/79 | HR 91 | Temp 97.8°F | Resp 14 | Ht 64.0 in | Wt 177.0 lb

## 2014-12-09 DIAGNOSIS — B20 Human immunodeficiency virus [HIV] disease: Secondary | ICD-10-CM

## 2014-12-09 DIAGNOSIS — C9000 Multiple myeloma not having achieved remission: Secondary | ICD-10-CM

## 2014-12-09 DIAGNOSIS — Z5112 Encounter for antineoplastic immunotherapy: Secondary | ICD-10-CM

## 2014-12-09 LAB — CMP (CANCER CENTER ONLY)
ALBUMIN: 3.5 g/dL (ref 3.3–5.5)
ALK PHOS: 90 U/L — AB (ref 26–84)
ALT(SGPT): 21 U/L (ref 10–47)
AST: 25 U/L (ref 11–38)
BUN, Bld: 17 mg/dL (ref 7–22)
CO2: 22 mEq/L (ref 18–33)
Calcium: 9.3 mg/dL (ref 8.0–10.3)
Chloride: 103 mEq/L (ref 98–108)
Creat: 1.2 mg/dl (ref 0.6–1.2)
GLUCOSE: 113 mg/dL (ref 73–118)
POTASSIUM: 3.8 meq/L (ref 3.3–4.7)
SODIUM: 144 meq/L (ref 128–145)
TOTAL PROTEIN: 6.9 g/dL (ref 6.4–8.1)
Total Bilirubin: 0.5 mg/dl (ref 0.20–1.60)

## 2014-12-09 LAB — CBC WITH DIFFERENTIAL (CANCER CENTER ONLY)
BASO#: 0 10*3/uL (ref 0.0–0.2)
BASO%: 0.2 % (ref 0.0–2.0)
EOS ABS: 0.1 10*3/uL (ref 0.0–0.5)
EOS%: 2.5 % (ref 0.0–7.0)
HCT: 38.8 % (ref 34.8–46.6)
HGB: 13.5 g/dL (ref 11.6–15.9)
LYMPH#: 2.3 10*3/uL (ref 0.9–3.3)
LYMPH%: 43.9 % (ref 14.0–48.0)
MCH: 36.5 pg — AB (ref 26.0–34.0)
MCHC: 34.8 g/dL (ref 32.0–36.0)
MCV: 105 fL — ABNORMAL HIGH (ref 81–101)
MONO#: 0.5 10*3/uL (ref 0.1–0.9)
MONO%: 10.2 % (ref 0.0–13.0)
NEUT#: 2.3 10*3/uL (ref 1.5–6.5)
NEUT%: 43.2 % (ref 39.6–80.0)
Platelets: 207 10*3/uL (ref 145–400)
RBC: 3.7 10*6/uL (ref 3.70–5.32)
RDW: 13.1 % (ref 11.1–15.7)
WBC: 5.2 10*3/uL (ref 3.9–10.0)

## 2014-12-09 MED ORDER — SODIUM CHLORIDE 0.9 % IV SOLN: Freq: Once | INTRAVENOUS | Status: DC

## 2014-12-09 MED ORDER — ONDANSETRON HCL 8 MG PO TABS: 8.0000 mg | ORAL_TABLET | Freq: Once | ORAL | Status: DC

## 2014-12-09 MED ORDER — BORTEZOMIB CHEMO SQ INJECTION 3.5 MG (2.5MG/ML)
1.3000 mg/m2 | Freq: Once | INTRAMUSCULAR | Status: AC
  Administered 2014-12-09: 2.5 mg via SUBCUTANEOUS
  Filled 2014-12-09: qty 2.5

## 2014-12-09 NOTE — Patient Instructions (Signed)
Bortezomib injection What is this medicine? BORTEZOMIB (bor TEZ oh mib) is a chemotherapy drug. It slows the growth of cancer cells. This medicine is used to treat multiple myeloma, and certain lymphomas, such as mantle-cell lymphoma. This medicine may be used for other purposes; ask your health care provider or pharmacist if you have questions. COMMON BRAND NAME(S): Velcade What should I tell my health care provider before I take this medicine? They need to know if you have any of these conditions: -diabetes -heart disease -irregular heartbeat -liver disease -on hemodialysis -low blood counts, like low white blood cells, platelets, or hemoglobin -peripheral neuropathy -taking medicine for blood pressure -an unusual or allergic reaction to bortezomib, mannitol, boron, other medicines, foods, dyes, or preservatives -pregnant or trying to get pregnant -breast-feeding How should I use this medicine? This medicine is for injection into a vein or for injection under the skin. It is given by a health care professional in a hospital or clinic setting. Talk to your pediatrician regarding the use of this medicine in children. Special care may be needed. Overdosage: If you think you have taken too much of this medicine contact a poison control center or emergency room at once. NOTE: This medicine is only for you. Do not share this medicine with others. What if I miss a dose? It is important not to miss your dose. Call your doctor or health care professional if you are unable to keep an appointment. What may interact with this medicine? This medicine may interact with the following medications: -ketoconazole -rifampin -ritonavir -St. John's Wort This list may not describe all possible interactions. Give your health care provider a list of all the medicines, herbs, non-prescription drugs, or dietary supplements you use. Also tell them if you smoke, drink alcohol, or use illegal drugs. Some items  may interact with your medicine. What should I watch for while using this medicine? Visit your doctor for checks on your progress. This drug may make you feel generally unwell. This is not uncommon, as chemotherapy can affect healthy cells as well as cancer cells. Report any side effects. Continue your course of treatment even though you feel ill unless your doctor tells you to stop. You may get drowsy or dizzy. Do not drive, use machinery, or do anything that needs mental alertness until you know how this medicine affects you. Do not stand or sit up quickly, especially if you are an older patient. This reduces the risk of dizzy or fainting spells. In some cases, you may be given additional medicines to help with side effects. Follow all directions for their use. Call your doctor or health care professional for advice if you get a fever, chills or sore throat, or other symptoms of a cold or flu. Do not treat yourself. This drug decreases your body's ability to fight infections. Try to avoid being around people who are sick. This medicine may increase your risk to bruise or bleed. Call your doctor or health care professional if you notice any unusual bleeding. You may need blood work done while you are taking this medicine. In some patients, this medicine may cause a serious brain infection that may cause death. If you have any problems seeing, thinking, speaking, walking, or standing, tell your doctor right away. If you cannot reach your doctor, urgently seek other source of medical care. Do not become pregnant while taking this medicine. Women should inform their doctor if they wish to become pregnant or think they might be pregnant. There is   a potential for serious side effects to an unborn child. Talk to your health care professional or pharmacist for more information. Do not breast-feed an infant while taking this medicine. Check with your doctor or health care professional if you get an attack of  severe diarrhea, nausea and vomiting, or if you sweat a lot. The loss of too much body fluid can make it dangerous for you to take this medicine. What side effects may I notice from receiving this medicine? Side effects that you should report to your doctor or health care professional as soon as possible: -allergic reactions like skin rash, itching or hives, swelling of the face, lips, or tongue -breathing problems -changes in hearing -changes in vision -fast, irregular heartbeat -feeling faint or lightheaded, falls -pain, tingling, numbness in the hands or feet -right upper belly pain -seizures -swelling of the ankles, feet, hands -unusual bleeding or bruising -unusually weak or tired -vomiting -yellowing of the eyes or skin Side effects that usually do not require medical attention (report to your doctor or health care professional if they continue or are bothersome): -changes in emotions or moods -constipation -diarrhea -loss of appetite -headache -irritation at site where injected -nausea This list may not describe all possible side effects. Call your doctor for medical advice about side effects. You may report side effects to FDA at 1-800-FDA-1088. Where should I keep my medicine? This drug is given in a hospital or clinic and will not be stored at home. NOTE: This sheet is a summary. It may not cover all possible information. If you have questions about this medicine, talk to your doctor, pharmacist, or health care provider.  2015, Elsevier/Gold Standard. (2013-07-22 12:46:32)  

## 2014-12-09 NOTE — Progress Notes (Signed)
[place]  [phone] [phone]  ID: [name] OB: [birth year] MR#: [id number] RWE#:[id number] Patient Care Team: Jonathon Bellows, MD as PCP - General (Family Medicine) Elmarie Shiley, MD (Nephrology) Heath Lark, MD as Consulting Physician (Hematology and Oncology) Heath Lark, MD as Consulting Physician (Hematology and Oncology) Elmarie Shiley, MD as Consulting Physician (Nephrology) Truman Hayward, MD as Consulting Physician (Infectious Diseases) Inez Pilgrim, MD as Consulting Physician (Oncology)  DIAGNOSIS: IgG kappa myeloma s/p BMT - Autologous at [facility] in 05/2014 HIV-positive  INTERVAL HISTORY: Jennifer Boyle is here today for follow-up and injection. She had her 6 month follow-up with Gaspar Cola last week and states that she got a good report. She has done very well since having her bone marrow transplant in August of last year.  She denies fatigue, fever, chills, cough, rash, headache, dizziness, blurred vision, SOB, chest pain, palpitations, abdominal pain, constipation, diarrhea, problems urinating, blood in urine or stool.  No swelling, tenderness, numbness or tingling in her extremities. No new aches or pains.  Her appetite is good and she is staying hydrated. Her weight is stable at 177 lbs.  There have been no issues with the HIV. She is currently on [place] and is followed by infectious disease Dr. Tommy Medal.   CURRENT TREATMENT: Velcade - every 2 week maintenance dosing  Zometa every 3 months  REVIEW OF SYSTEMS: All other 10 point review of systems is negative except for those issues mentioned above.   PAST MEDICAL HISTORY: Past Medical History  Diagnosis Date  . Pneumonia 10/2002; 2010    Required INTUBATION for streptococcal pneumonia/septicemia (10/2002)  . HIV disease DX: 01/2002  . Fibroid uterus   . 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 Peripheral neuropathy     resolved.   . 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   Myeloma FISH showed presence of +11, and +17.   . Multiple myeloma    PAST SURGICAL HISTORY: Past Surgical History  Procedure Laterality Date  . No past surgeries    . Bone marrow transplant     FAMILY HISTORY Family History  Problem Relation Age of Onset  . Hypertension Mother    GYNECOLOGIC HISTORY:  Patient's last menstrual period was 01/04/2013.   SOCIAL HISTORY:  History   Social History  . Marital Status: Single    Spouse Name: N/A  . Number of Children: 0  . Years of Education: N/A   Occupational History  .      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    Social History Main Topics  . Smoking status: Never Smoker   . Smokeless tobacco: Never Used     Comment: never used tobacco  . Alcohol Use: No  . Drug Use: No  . Sexual Activity: Not Currently   Other Topics Concern  . Not on file   Social History Narrative   The patient is single. The patient has no children.   The patient is originally from [place] then moved to the  [place] approximately 25 years ago.   The patient works as a Tour manager Banker).   Patient has never smoked. The patient does not use alcohol.    The patient has never used smokeless tobacco. The patient denies IV drug abuse.   ADVANCED DIRECTIVES: <no information>  HEALTH MAINTENANCE: History  Substance Use Topics  . Smoking status: Never Smoker   . Smokeless tobacco: Never Used     Comment: never used tobacco  . Alcohol Use: No   Colonoscopy: PAP: Bone density: Lipid panel:  Allergies   Allergen Reactions  . Aspirin     Upset stomach   . Efavirenz Hives and Rash    (brand name sustiva)   Current Outpatient Prescriptions  Medication Sig Dispense Refill  . Abacavir-Dolutegravir-Lamivud 600-50-300 MG TABS Take 1 tablet by mouth every morning. 30 tablet 5  . calcium carbonate (OS-CAL) 600 MG TABS tablet Take 600 mg by mouth daily with breakfast.    . cholecalciferol (VITAMIN D) 1000 UNITS tablet Take 1 tablet (1,000 Units total) by mouth daily. 30 tablet 3  . docusate sodium (COLACE) 100 MG capsule Take 100 mg by mouth 2 (two) times daily.    [name] lidocaine-prilocaine (EMLA) cream Apply topically as needed. Apply to portacath one hour before chemo access or blood draw. 30 g 3  . ondansetron (ZOFRAN) 8 MG tablet Take 8 mg by mouth every 8 (eight) hours as needed.     . polyethylene glycol (MIRALAX / GLYCOLAX) packet Take 17 g by mouth as needed.     . TraMADol HCl 50 MG TBDP Take by mouth every 6 (six) hours as needed.    . valACYclovir (VALTREX) 500 MG tablet TAKE 1 TABLET (500 MG TOTAL) BY MOUTH DAILY. 30 tablet 4   No current facility-administered medications for this visit.   OBJECTIVE: Filed Vitals:   12/09/14 1310  BP: 130/79  Pulse: 91  Temp: 97.8 F (36.6 C)  Resp: 14   Body mass index is 30.37 kg/(m^2). ECOG FS:0 - Asymptomatic Ocular: Sclerae unicteric, pupils equal, round and reactive to light Ear-nose-throat: Oropharynx clear, dentition fair Lymphatic: No cervical or supraclavicular adenopathy Lungs no rales or rhonchi, good excursion bilaterally Heart regular rate and rhythm, no murmur appreciated Abd soft, nontender, positive bowel sounds MSK no focal spinal tenderness, no joint edema Neuro: non-focal, well-oriented, appropriate affect Breasts: Deferred  LAB RESULTS: CMP     Component Value Date/Time   NA 141 11/25/2014 1417   NA 139 10/17/2014 0837   NA 141 04/04/2014 0852   K 4.0 11/25/2014 1417   K 3.8 10/17/2014 0837   K 4.2 04/04/2014  0852   CL 109 11/25/2014 1417   CL 104 10/17/2014 0837   CL 111* 03/29/2013 1351   CO2 21 11/25/2014 1417   CO2 21 10/17/2014 0837   CO2 22 04/04/2014 0852   GLUCOSE 106* 11/25/2014 1417   GLUCOSE 124* 10/17/2014 0837   GLUCOSE 118 04/04/2014 0852   GLUCOSE 129* 03/29/2013 1351   BUN 26* 11/25/2014 1417   BUN 14 10/17/2014 0837   BUN 20.9 04/04/2014 0852   CREATININE 1.25* 11/25/2014 1417   CREATININE 0.9 10/17/2014 0837   CREATININE 1.2* 04/04/2014 0852   CALCIUM 9.3 11/25/2014 1417   CALCIUM 9.1 10/17/2014 0837   CALCIUM 9.4 04/04/2014 0852   PROT 6.4 11/25/2014 1417   PROT 6.9 10/17/2014 0837   PROT 7.2 04/04/2014 0852   ALBUMIN 3.7 11/25/2014 1417   ALBUMIN 3.7 04/04/2014 5514  AST 19 11/25/2014 1417   AST 24 10/17/2014 0837   AST 20 04/04/2014 0852   ALT 18 11/25/2014 1417   ALT 18 10/17/2014 0837   ALT 17 04/04/2014 0852   ALKPHOS 85 11/25/2014 1417   ALKPHOS 81 10/17/2014 0837   ALKPHOS 94 04/04/2014 0852   BILITOT 0.4 11/25/2014 1417   BILITOT 0.50 10/17/2014 0837   BILITOT 0.23 04/04/2014 0852   GFRNONAA 67 05/02/2013 1412   GFRNONAA 12* 02/04/2013 0850   GFRAA 77 05/02/2013 1412   GFRAA 14* 02/04/2013 0850   No results found for: SPEP Lab Results  Component Value Date   WBC 5.2 12/09/2014   NEUTROABS 2.3 12/09/2014   HGB 13.5 12/09/2014   HCT 38.8 12/09/2014   MCV 105* 12/09/2014   PLT 207 12/09/2014   No results found for: LABCA2 No components found for: LABCA125 No results for input(s): INR in the last 168 hours.  STUDIES: No results found.  ASSESSMENT/PLAN: Jennifer Boyle is 49 year old African American female who is HIV positive. She has IgG kappa myeloma and had a BMT in August of last year. She has done well and has no complaints at this time.  She is currently receiving Velcade every 2 weeks as maintenance. She will get her injection today. Her CBC and CMP today were good. We will see what the rest of her labs show.  We will see her back  in 1 month for follow-up, labs and injection. She knows to call here with any questions or concerns and to go to the ED in the event of an emergency. We can certainly see her sooner if need be.    Eliezer Bottom, NP 12/09/2014 1:31 PM

## 2014-12-10 ENCOUNTER — Ambulatory Visit: Payer: Self-pay | Admitting: Family

## 2014-12-10 ENCOUNTER — Other Ambulatory Visit: Payer: Self-pay | Admitting: Lab

## 2014-12-10 ENCOUNTER — Ambulatory Visit: Payer: Self-pay

## 2014-12-11 LAB — PROTEIN ELECTROPHORESIS, SERUM, WITH REFLEX
ALPHA-1-GLOBULIN: 4.5 % (ref 2.9–4.9)
Albumin ELP: 57.9 % (ref 55.8–66.1)
Alpha-2-Globulin: 13.6 % — ABNORMAL HIGH (ref 7.1–11.8)
BETA 2: 4.8 % (ref 3.2–6.5)
Beta Globulin: 5.6 % (ref 4.7–7.2)
Gamma Globulin: 13.6 % (ref 11.1–18.8)
M-SPIKE, %: 0.21 g/dL
Total Protein, Serum Electrophoresis: 6.7 g/dL (ref 6.0–8.3)

## 2014-12-11 LAB — KAPPA/LAMBDA LIGHT CHAINS
Kappa free light chain: 3.83 mg/dL — ABNORMAL HIGH (ref 0.33–1.94)
Kappa:Lambda Ratio: 3.91 — ABNORMAL HIGH (ref 0.26–1.65)
Lambda Free Lght Chn: 0.98 mg/dL (ref 0.57–2.63)

## 2014-12-11 LAB — IGG, IGA, IGM
IGM, SERUM: 33 mg/dL — AB (ref 52–322)
IgA: 52 mg/dL — ABNORMAL LOW (ref 69–380)
IgG (Immunoglobin G), Serum: 1060 mg/dL (ref 690–1700)

## 2014-12-11 LAB — IFE INTERPRETATION

## 2014-12-23 ENCOUNTER — Other Ambulatory Visit (HOSPITAL_BASED_OUTPATIENT_CLINIC_OR_DEPARTMENT_OTHER): Payer: Commercial Managed Care - PPO | Admitting: Lab

## 2014-12-23 ENCOUNTER — Ambulatory Visit (HOSPITAL_BASED_OUTPATIENT_CLINIC_OR_DEPARTMENT_OTHER): Payer: Commercial Managed Care - PPO

## 2014-12-23 DIAGNOSIS — C9000 Multiple myeloma not having achieved remission: Secondary | ICD-10-CM

## 2014-12-23 DIAGNOSIS — Z5112 Encounter for antineoplastic immunotherapy: Secondary | ICD-10-CM

## 2014-12-23 LAB — CBC WITH DIFFERENTIAL (CANCER CENTER ONLY)
BASO#: 0 10*3/uL (ref 0.0–0.2)
BASO%: 0.4 % (ref 0.0–2.0)
EOS ABS: 0.2 10*3/uL (ref 0.0–0.5)
EOS%: 2.8 % (ref 0.0–7.0)
HEMATOCRIT: 39.1 % (ref 34.8–46.6)
HGB: 13.2 g/dL (ref 11.6–15.9)
LYMPH#: 2.4 10*3/uL (ref 0.9–3.3)
LYMPH%: 44 % (ref 14.0–48.0)
MCH: 35.1 pg — ABNORMAL HIGH (ref 26.0–34.0)
MCHC: 33.8 g/dL (ref 32.0–36.0)
MCV: 104 fL — ABNORMAL HIGH (ref 81–101)
MONO#: 0.5 10*3/uL (ref 0.1–0.9)
MONO%: 9.6 % (ref 0.0–13.0)
NEUT#: 2.3 10*3/uL (ref 1.5–6.5)
NEUT%: 43.2 % (ref 39.6–80.0)
Platelets: 197 10*3/uL (ref 145–400)
RBC: 3.76 10*6/uL (ref 3.70–5.32)
RDW: 12.6 % (ref 11.1–15.7)
WBC: 5.3 10*3/uL (ref 3.9–10.0)

## 2014-12-23 LAB — COMPREHENSIVE METABOLIC PANEL
ALBUMIN: 4.1 g/dL (ref 3.5–5.2)
ALK PHOS: 89 U/L (ref 39–117)
ALT: 17 U/L (ref 0–35)
AST: 19 U/L (ref 0–37)
BUN: 20 mg/dL (ref 6–23)
CALCIUM: 9.2 mg/dL (ref 8.4–10.5)
CHLORIDE: 110 meq/L (ref 96–112)
CO2: 22 mEq/L (ref 19–32)
Creatinine, Ser: 1.01 mg/dL (ref 0.50–1.10)
GLUCOSE: 109 mg/dL — AB (ref 70–99)
POTASSIUM: 4.3 meq/L (ref 3.5–5.3)
SODIUM: 143 meq/L (ref 135–145)
TOTAL PROTEIN: 6.3 g/dL (ref 6.0–8.3)
Total Bilirubin: 0.3 mg/dL (ref 0.2–1.2)

## 2014-12-23 LAB — LACTATE DEHYDROGENASE: LDH: 187 U/L (ref 94–250)

## 2014-12-23 MED ORDER — HEPARIN SOD (PORK) LOCK FLUSH 100 UNIT/ML IV SOLN
500.0000 [IU] | Freq: Once | INTRAVENOUS | Status: AC | PRN
  Administered 2014-12-23: 500 [IU]
  Filled 2014-12-23: qty 5

## 2014-12-23 MED ORDER — SODIUM CHLORIDE 0.9 % IJ SOLN
10.0000 mL | INTRAMUSCULAR | Status: DC | PRN
  Administered 2014-12-23: 10 mL
  Filled 2014-12-23: qty 10

## 2014-12-23 MED ORDER — SODIUM CHLORIDE 0.9 % IV SOLN: Freq: Once | INTRAVENOUS | Status: DC

## 2014-12-23 MED ORDER — ONDANSETRON HCL 8 MG PO TABS: 8.0000 mg | ORAL_TABLET | Freq: Once | ORAL | Status: DC

## 2014-12-23 MED ORDER — BORTEZOMIB CHEMO SQ INJECTION 3.5 MG (2.5MG/ML)
1.3000 mg/m2 | Freq: Once | INTRAMUSCULAR | Status: AC
  Administered 2014-12-23: 2.5 mg via SUBCUTANEOUS
  Filled 2014-12-23: qty 2.5

## 2014-12-23 NOTE — Patient Instructions (Signed)
Bortezomib injection What is this medicine? BORTEZOMIB (bor TEZ oh mib) is a chemotherapy drug. It slows the growth of cancer cells. This medicine is used to treat multiple myeloma, and certain lymphomas, such as mantle-cell lymphoma. This medicine may be used for other purposes; ask your health care provider or pharmacist if you have questions. COMMON BRAND NAME(S): Velcade What should I tell my health care provider before I take this medicine? They need to know if you have any of these conditions: -diabetes -heart disease -irregular heartbeat -liver disease -on hemodialysis -low blood counts, like low white blood cells, platelets, or hemoglobin -peripheral neuropathy -taking medicine for blood pressure -an unusual or allergic reaction to bortezomib, mannitol, boron, other medicines, foods, dyes, or preservatives -pregnant or trying to get pregnant -breast-feeding How should I use this medicine? This medicine is for injection into a vein or for injection under the skin. It is given by a health care professional in a hospital or clinic setting. Talk to your pediatrician regarding the use of this medicine in children. Special care may be needed. Overdosage: If you think you have taken too much of this medicine contact a poison control center or emergency room at once. NOTE: This medicine is only for you. Do not share this medicine with others. What if I miss a dose? It is important not to miss your dose. Call your doctor or health care professional if you are unable to keep an appointment. What may interact with this medicine? This medicine may interact with the following medications: -ketoconazole -rifampin -ritonavir -St. John's Wort This list may not describe all possible interactions. Give your health care provider a list of all the medicines, herbs, non-prescription drugs, or dietary supplements you use. Also tell them if you smoke, drink alcohol, or use illegal drugs. Some items  may interact with your medicine. What should I watch for while using this medicine? Visit your doctor for checks on your progress. This drug may make you feel generally unwell. This is not uncommon, as chemotherapy can affect healthy cells as well as cancer cells. Report any side effects. Continue your course of treatment even though you feel ill unless your doctor tells you to stop. You may get drowsy or dizzy. Do not drive, use machinery, or do anything that needs mental alertness until you know how this medicine affects you. Do not stand or sit up quickly, especially if you are an older patient. This reduces the risk of dizzy or fainting spells. In some cases, you may be given additional medicines to help with side effects. Follow all directions for their use. Call your doctor or health care professional for advice if you get a fever, chills or sore throat, or other symptoms of a cold or flu. Do not treat yourself. This drug decreases your body's ability to fight infections. Try to avoid being around people who are sick. This medicine may increase your risk to bruise or bleed. Call your doctor or health care professional if you notice any unusual bleeding. You may need blood work done while you are taking this medicine. In some patients, this medicine may cause a serious brain infection that may cause death. If you have any problems seeing, thinking, speaking, walking, or standing, tell your doctor right away. If you cannot reach your doctor, urgently seek other source of medical care. Do not become pregnant while taking this medicine. Women should inform their doctor if they wish to become pregnant or think they might be pregnant. There is   a potential for serious side effects to an unborn child. Talk to your health care professional or pharmacist for more information. Do not breast-feed an infant while taking this medicine. Check with your doctor or health care professional if you get an attack of  severe diarrhea, nausea and vomiting, or if you sweat a lot. The loss of too much body fluid can make it dangerous for you to take this medicine. What side effects may I notice from receiving this medicine? Side effects that you should report to your doctor or health care professional as soon as possible: -allergic reactions like skin rash, itching or hives, swelling of the face, lips, or tongue -breathing problems -changes in hearing -changes in vision -fast, irregular heartbeat -feeling faint or lightheaded, falls -pain, tingling, numbness in the hands or feet -right upper belly pain -seizures -swelling of the ankles, feet, hands -unusual bleeding or bruising -unusually weak or tired -vomiting -yellowing of the eyes or skin Side effects that usually do not require medical attention (report to your doctor or health care professional if they continue or are bothersome): -changes in emotions or moods -constipation -diarrhea -loss of appetite -headache -irritation at site where injected -nausea This list may not describe all possible side effects. Call your doctor for medical advice about side effects. You may report side effects to FDA at 1-800-FDA-1088. Where should I keep my medicine? This drug is given in a hospital or clinic and will not be stored at home. NOTE: This sheet is a summary. It may not cover all possible information. If you have questions about this medicine, talk to your doctor, pharmacist, or health care provider.  2015, Elsevier/Gold Standard. (2013-07-22 12:46:32)  

## 2014-12-25 ENCOUNTER — Telehealth: Payer: Self-pay | Admitting: *Deleted

## 2014-12-25 NOTE — Telephone Encounter (Signed)
Triumeq must go through [employer], [phone].  Pt advised to call this number to "opt out" of the mail order to be able to pick up Triumeq at [facility].

## 2014-12-25 NOTE — Telephone Encounter (Signed)
Called the Patient Jennifer Boyle was approved for her Prezcobix and Truvada.  She is covered through 12-25-15.

## 2014-12-25 NOTE — Telephone Encounter (Signed)
Wrong meds to account.  Should be Triumeq.

## 2014-12-26 ENCOUNTER — Other Ambulatory Visit: Payer: Self-pay | Admitting: Lab

## 2014-12-26 ENCOUNTER — Ambulatory Visit: Payer: Self-pay | Admitting: Family

## 2014-12-29 ENCOUNTER — Encounter: Payer: Self-pay | Admitting: *Deleted

## 2014-12-29 NOTE — Progress Notes (Signed)
Patient ID: Jennifer Boyle, female   DOB: 04-15-1966, 49 y.o.   MRN: [id number] Pt's Triumeq rx requiring a PA.  Completed paperwork and placed in MD's box for signature.  Added 4 MD office notes with rationale for HIV medication changes.

## 2014-12-30 ENCOUNTER — Other Ambulatory Visit: Payer: Self-pay | Admitting: Licensed Clinical Social Worker

## 2014-12-30 DIAGNOSIS — B20 Human immunodeficiency virus [HIV] disease: Secondary | ICD-10-CM

## 2014-12-30 MED ORDER — ABACAVIR-DOLUTEGRAVIR-LAMIVUD 600-50-300 MG PO TABS: 1.0000 | ORAL_TABLET | Freq: Every morning | ORAL | Status: AC

## 2014-12-30 NOTE — Progress Notes (Signed)
Patient ID: Jennifer Boyle, female   DOB: 10-02-1966, 49 y.o.   MRN: [id number] Pt has now been told that she must receive her medications through the [facility].  She is going to that pharmacy tomorrow.

## 2014-12-30 NOTE — Progress Notes (Addendum)
Patient ID: Jennifer Boyle, female   DOB: 05-19-1966, 49 y.o.   MRN: [id number] [facility] - responded that pt does not need a PA for Triumeq.  Spoke with [facility] at [place] [facility].  The pt does not need to use a mail order for this medication.  [facility]  [facility] to [facility].  [facility] and their system still responded that the pt needed to use the mail order.  The [facility] did not have the [facility]'s latest insurance information.  RN faxed latest insurance card information to [facility] and a copy of the fax from [place].[name]

## 2015-01-06 ENCOUNTER — Ambulatory Visit: Payer: Self-pay | Admitting: Hematology & Oncology

## 2015-01-06 ENCOUNTER — Other Ambulatory Visit: Payer: Self-pay | Admitting: Nurse Practitioner

## 2015-01-06 ENCOUNTER — Other Ambulatory Visit: Payer: Self-pay | Admitting: Lab

## 2015-01-06 ENCOUNTER — Ambulatory Visit: Payer: Self-pay

## 2015-01-06 DIAGNOSIS — C9000 Multiple myeloma not having achieved remission: Secondary | ICD-10-CM

## 2015-01-07 ENCOUNTER — Other Ambulatory Visit (HOSPITAL_BASED_OUTPATIENT_CLINIC_OR_DEPARTMENT_OTHER): Payer: Commercial Managed Care - PPO

## 2015-01-07 ENCOUNTER — Ambulatory Visit (HOSPITAL_BASED_OUTPATIENT_CLINIC_OR_DEPARTMENT_OTHER): Payer: Commercial Managed Care - PPO | Admitting: Hematology & Oncology

## 2015-01-07 ENCOUNTER — Encounter: Payer: Self-pay | Admitting: Hematology & Oncology

## 2015-01-07 ENCOUNTER — Ambulatory Visit (HOSPITAL_BASED_OUTPATIENT_CLINIC_OR_DEPARTMENT_OTHER): Payer: Commercial Managed Care - PPO

## 2015-01-07 VITALS — BP 130/83 | HR 91 | Temp 98.3°F | Resp 14 | Ht 64.0 in | Wt 176.0 lb

## 2015-01-07 DIAGNOSIS — C9000 Multiple myeloma not having achieved remission: Secondary | ICD-10-CM | POA: Diagnosis not present

## 2015-01-07 DIAGNOSIS — Z5112 Encounter for antineoplastic immunotherapy: Secondary | ICD-10-CM | POA: Diagnosis not present

## 2015-01-07 LAB — CBC WITH DIFFERENTIAL (CANCER CENTER ONLY)
BASO#: 0 10e3/uL (ref 0.0–0.2)
BASO%: 0.2 % (ref 0.0–2.0)
EOS%: 1.7 % (ref 0.0–7.0)
Eosinophils Absolute: 0.1 10e3/uL (ref 0.0–0.5)
HCT: 38 % (ref 34.8–46.6)
HGB: 12.9 g/dL (ref 11.6–15.9)
LYMPH#: 2.4 10e3/uL (ref 0.9–3.3)
LYMPH%: 45.5 % (ref 14.0–48.0)
MCH: 35.1 pg — ABNORMAL HIGH (ref 26.0–34.0)
MCHC: 33.9 g/dL (ref 32.0–36.0)
MCV: 104 fL — ABNORMAL HIGH (ref 81–101)
MONO#: 0.5 10e3/uL (ref 0.1–0.9)
MONO%: 9.8 % (ref 0.0–13.0)
NEUT#: 2.2 10e3/uL (ref 1.5–6.5)
NEUT%: 42.8 % (ref 39.6–80.0)
Platelets: 187 10e3/uL (ref 145–400)
RBC: 3.67 10e6/uL — ABNORMAL LOW (ref 3.70–5.32)
RDW: 12.8 % (ref 11.1–15.7)
WBC: 5.2 10e3/uL (ref 3.9–10.0)

## 2015-01-07 LAB — CMP (CANCER CENTER ONLY)
ALBUMIN: 3.3 g/dL (ref 3.3–5.5)
ALK PHOS: 84 U/L (ref 26–84)
ALT(SGPT): 17 U/L (ref 10–47)
AST: 26 U/L (ref 11–38)
BUN, Bld: 19 mg/dL (ref 7–22)
CO2: 25 meq/L (ref 18–33)
Calcium: 9.4 mg/dL (ref 8.0–10.3)
Chloride: 111 mEq/L — ABNORMAL HIGH (ref 98–108)
Creat: 1.4 mg/dl — ABNORMAL HIGH (ref 0.6–1.2)
Glucose, Bld: 101 mg/dL (ref 73–118)
POTASSIUM: 4.4 meq/L (ref 3.3–4.7)
Sodium: 145 mEq/L (ref 128–145)
TOTAL PROTEIN: 6.9 g/dL (ref 6.4–8.1)
Total Bilirubin: 0.5 mg/dl (ref 0.20–1.60)

## 2015-01-07 MED ORDER — HEPARIN SOD (PORK) LOCK FLUSH 100 UNIT/ML IV SOLN
500.0000 [IU] | Freq: Once | INTRAVENOUS | Status: AC | PRN
  Administered 2015-01-07: 500 [IU]
  Filled 2015-01-07: qty 5

## 2015-01-07 MED ORDER — ZOLEDRONIC ACID 4 MG/100ML IV SOLN
4.0000 mg | Freq: Once | INTRAVENOUS | Status: AC
  Administered 2015-01-07: 4 mg via INTRAVENOUS
  Filled 2015-01-07: qty 100

## 2015-01-07 MED ORDER — SODIUM CHLORIDE 0.9 % IV SOLN
Freq: Once | INTRAVENOUS | Status: AC
  Administered 2015-01-07: 14:00:00 via INTRAVENOUS

## 2015-01-07 MED ORDER — BORTEZOMIB CHEMO SQ INJECTION 3.5 MG (2.5MG/ML)
1.3000 mg/m2 | Freq: Once | INTRAMUSCULAR | Status: AC
  Administered 2015-01-07: 2.5 mg via SUBCUTANEOUS
  Filled 2015-01-07: qty 2.5

## 2015-01-07 MED ORDER — SODIUM CHLORIDE 0.9 % IJ SOLN
10.0000 mL | INTRAMUSCULAR | Status: DC | PRN
  Administered 2015-01-07: 10 mL
  Filled 2015-01-07: qty 10

## 2015-01-07 NOTE — Progress Notes (Signed)
Hematology and Oncology Follow Up Visit  Joliana Claflin [id number] 1966/01/23 49 y.o. 01/07/2015   Principle Diagnosis:   IgG kappa myeloma   HIV-positive  Current Therapy:    S/p BMT - Autologous at [facility] in 05/2014  Velcade -every 2 week maintenance dosing     Interim History:  Ms.  Kister is back for followup. She had a work over the [employer] weekend. 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 She was quite busy.  She's having a lot of issues with bills that her insurance is not covering. Every time she sees [place], it is about $4000. This is crazy. I will see what I can do to try to help with this.  I think it would be reasonable to move her Velcade back to every 3 weeks. This would make a lot of sense to me.  Her last myeloma studies in early March showed a M spike of 0.21 g/dL. Her IgG level was 1060 mg/dL. Her Kappa Lightchain was 3.83 mg/dL.  Overall, she seems to be doing okay. She's had no fever. The HIV is still in remission. She sees infectious disease for this. She's had no diarrhea. She's had no rashes. She's had no cough. She's had no nausea or vomiting.  Currently, her performance status is ECOG 1.  Medications:  Current outpatient prescriptions:  .  Abacavir-Dolutegravir-Lamivud 600-50-300 MG TABS, Take 1 tablet by mouth every morning., Disp: 30 tablet, Rfl: 5 .  calcium carbonate (OS-CAL) 600 MG TABS tablet, Take 600 mg by mouth daily with breakfast., Disp: , Rfl:  .  cholecalciferol (VITAMIN D) 1000 UNITS tablet, Take 1 tablet (1,000 Units total) by mouth daily., Disp: 30 tablet, Rfl: 3 .  docusate sodium (COLACE) 100 MG capsule, Take 100 mg by mouth 2 (two) times daily., Disp: , Rfl:  .  lidocaine-prilocaine (EMLA) cream, Apply topically as needed. Apply to portacath one hour before chemo access or blood draw., Disp: 30 g, Rfl: 3 .  ondansetron (ZOFRAN) 8 MG tablet, Take 8 mg by mouth every 8 (eight) hours as needed. , Disp: , Rfl:  .  polyethylene glycol  (MIRALAX / GLYCOLAX) packet, Take 17 g by mouth as needed. , Disp: , Rfl:  .  TraMADol HCl 50 MG TBDP, Take by mouth every 6 (six) hours as needed., Disp: , Rfl:  .  valACYclovir (VALTREX) 500 MG tablet, TAKE 1 TABLET (500 MG TOTAL) BY MOUTH DAILY., Disp: 30 tablet, Rfl: 4 No current facility-administered medications for this visit.  Facility-Administered Medications Ordered in Other Visits:  .  0.9 %  sodium chloride infusion, , Intravenous, Once, Chauncey Cruel, MD .  ondansetron [facility]) tablet 8 mg, 8 mg, Oral, Once, Chauncey Cruel, MD, 8 mg at 12/09/14 1401  Allergies:  Allergies  Allergen Reactions  . Aspirin     Upset stomach   . Efavirenz Hives and Rash    (brand name sustiva)    Past Medical History, Surgical history, Social history, and Family History were reviewed and updated.  Review of Systems: As above  Physical Exam:  height is 5\' 4"  (1.626 m) and weight is 176 lb (79.833 kg). Her oral temperature is 98.3 F (36.8 C). Her blood pressure is 130/83 and her pulse is 91. Her respiration is 14.   [name]. Head and exam shows no ocular or oral lesions. There are no palpable cervical or supraclavicular lymph nodes. Lungs are clear. Cardiac exam regular rate and rhythm with  no murmurs rubs or bruits. Abdomen is soft. She has good bowel sounds. There is no fluid wave. There is no palpable liver or spleen tip. Back exam shows no tenderness over the spine, ribs or hips. Extremities shows no clubbing, cyanosis or edema. Neurological exam shows no focal neurological deficits. Skin exam no rashes, ecchymosis or petechia.  Lab Results  Component Value Date   WBC 5.2 01/07/2015   HGB 12.9 01/07/2015   HCT 38.0 01/07/2015   MCV 104* 01/07/2015   PLT 187 01/07/2015     Chemistry      Component Value Date/Time   NA 145 01/07/2015 1218   NA 143 12/23/2014 1239   NA 141 04/04/2014 0852   K 4.4 01/07/2015 1218   K 4.3 12/23/2014 1239     K 4.2 04/04/2014 0852   CL 111* 01/07/2015 1218   CL 110 12/23/2014 1239   CL 111* 03/29/2013 1351   CO2 25 01/07/2015 1218   CO2 22 12/23/2014 1239   CO2 22 04/04/2014 0852   BUN 19 01/07/2015 1218   BUN 20 12/23/2014 1239   BUN 20.9 04/04/2014 0852   CREATININE 1.4* 01/07/2015 1218   CREATININE 1.01 12/23/2014 1239   CREATININE 1.2* 04/04/2014 0852      Component Value Date/Time   CALCIUM 9.4 01/07/2015 1218   CALCIUM 9.2 12/23/2014 1239   CALCIUM 9.4 04/04/2014 0852   ALKPHOS 84 01/07/2015 1218   ALKPHOS 89 12/23/2014 1239   ALKPHOS 94 04/04/2014 0852   AST 26 01/07/2015 1218   AST 19 12/23/2014 1239   AST 20 04/04/2014 0852   ALT 17 01/07/2015 1218   ALT 17 12/23/2014 1239   ALT 17 04/04/2014 0852   BILITOT 0.50 01/07/2015 1218   BILITOT 0.3 12/23/2014 1239   BILITOT 0.23 04/04/2014 0852         Impression and Plan: Ms. Batchelder is 49 year old African [place] female. She has IgG kappa myeloma. She ultimately underwent a stem cell transplant. This was back on August 14. This was done at [facility].  She is on maintenance therapy with Velcade.. This was suggested by her transplant doctors down at [facility]. They recommended every other week Velcade. She is doing well with this so far.  The problem that she is having is with bills. Her insurance is not covering a lot of what we do.  She wants to move back her doctor visits. I think this to be reasonable. I wintertime with these back to every 3 months.  We can actually move the Velcade back to every 3 weeks if necessary.  She is still working. I want her to be able to work and enjoy work and I have to worry about all the bills that keep coming in.  I will plan to see her back in 3 months.   Volanda Napoleon, MD 3/30/20161:41 PM

## 2015-01-07 NOTE — Patient Instructions (Addendum)
Bortezomib injection What is this medicine? BORTEZOMIB (bor TEZ oh mib) is a chemotherapy drug. It slows the growth of cancer cells. This medicine is used to treat multiple myeloma, and certain lymphomas, such as mantle-cell lymphoma. This medicine may be used for other purposes; ask your health care provider or pharmacist if you have questions. COMMON BRAND NAME(S): Velcade What should I tell my health care provider before I take this medicine? They need to know if you have any of these conditions: -diabetes -heart disease -irregular heartbeat -liver disease -on hemodialysis -low blood counts, like low white blood cells, platelets, or hemoglobin -peripheral neuropathy -taking medicine for blood pressure -an unusual or allergic reaction to bortezomib, mannitol, boron, other medicines, foods, dyes, or preservatives -pregnant or trying to get pregnant -breast-feeding How should I use this medicine? This medicine is for injection into a vein or for injection under the skin. It is given by a health care professional in a hospital or clinic setting. Talk to your pediatrician regarding the use of this medicine in children. Special care may be needed. Overdosage: If you think you have taken too much of this medicine contact a poison control center or emergency room at once. NOTE: This medicine is only for you. Do not share this medicine with others. What if I miss a dose? It is important not to miss your dose. Call your doctor or health care professional if you are unable to keep an appointment. What may interact with this medicine? This medicine may interact with the following medications: -ketoconazole -rifampin -ritonavir -St. John's Wort This list may not describe all possible interactions. Give your health care provider a list of all the medicines, herbs, non-prescription drugs, or dietary supplements you use. Also tell them if you smoke, drink alcohol, or use illegal drugs. Some items  may interact with your medicine. What should I watch for while using this medicine? Visit your doctor for checks on your progress. This drug may make you feel generally unwell. This is not uncommon, as chemotherapy can affect healthy cells as well as cancer cells. Report any side effects. Continue your course of treatment even though you feel ill unless your doctor tells you to stop. You may get drowsy or dizzy. Do not drive, use machinery, or do anything that needs mental alertness until you know how this medicine affects you. Do not stand or sit up quickly, especially if you are an older patient. This reduces the risk of dizzy or fainting spells. In some cases, you may be given additional medicines to help with side effects. Follow all directions for their use. Call your doctor or health care professional for advice if you get a fever, chills or sore throat, or other symptoms of a cold or flu. Do not treat yourself. This drug decreases your body's ability to fight infections. Try to avoid being around people who are sick. This medicine may increase your risk to bruise or bleed. Call your doctor or health care professional if you notice any unusual bleeding. You may need blood work done while you are taking this medicine. In some patients, this medicine may cause a serious brain infection that may cause death. If you have any problems seeing, thinking, speaking, walking, or standing, tell your doctor right away. If you cannot reach your doctor, urgently seek other source of medical care. Do not become pregnant while taking this medicine. Women should inform their doctor if they wish to become pregnant or think they might be pregnant. There is   a potential for serious side effects to an unborn child. Talk to your health care professional or pharmacist for more information. Do not breast-feed an infant while taking this medicine. Check with your doctor or health care professional if you get an attack of  severe diarrhea, nausea and vomiting, or if you sweat a lot. The loss of too much body fluid can make it dangerous for you to take this medicine. What side effects may I notice from receiving this medicine? Side effects that you should report to your doctor or health care professional as soon as possible: -allergic reactions like skin rash, itching or hives, swelling of the face, lips, or tongue -breathing problems -changes in hearing -changes in vision -fast, irregular heartbeat -feeling faint or lightheaded, falls -pain, tingling, numbness in the hands or feet -right upper belly pain -seizures -swelling of the ankles, feet, hands -unusual bleeding or bruising -unusually weak or tired -vomiting -yellowing of the eyes or skin Side effects that usually do not require medical attention (report to your doctor or health care professional if they continue or are bothersome): -changes in emotions or moods -constipation -diarrhea -loss of appetite -headache -irritation at site where injected -nausea This list may not describe all possible side effects. Call your doctor for medical advice about side effects. You may report side effects to FDA at 1-800-FDA-1088. Where should I keep my medicine? This drug is given in a hospital or clinic and will not be stored at home. NOTE: This sheet is a summary. It may not cover all possible information. If you have questions about this medicine, talk to your doctor, pharmacist, or health care provider.  2015, Elsevier/Gold Standard. (2013-07-22 12:46:32)  

## 2015-01-09 ENCOUNTER — Encounter: Payer: Self-pay | Admitting: Infectious Disease

## 2015-01-12 ENCOUNTER — Telehealth: Payer: Self-pay | Admitting: Hematology & Oncology

## 2015-01-12 NOTE — Telephone Encounter (Signed)
RN's aware I am still waiting on clarification for changing appointments

## 2015-01-21 ENCOUNTER — Ambulatory Visit (HOSPITAL_BASED_OUTPATIENT_CLINIC_OR_DEPARTMENT_OTHER): Payer: Commercial Managed Care - PPO

## 2015-01-21 ENCOUNTER — Other Ambulatory Visit: Payer: Commercial Managed Care - PPO

## 2015-01-21 VITALS — BP 137/87 | HR 81 | Temp 97.6°F | Resp 16 | Wt 177.0 lb

## 2015-01-21 DIAGNOSIS — C9000 Multiple myeloma not having achieved remission: Secondary | ICD-10-CM | POA: Diagnosis not present

## 2015-01-21 DIAGNOSIS — Z5112 Encounter for antineoplastic immunotherapy: Secondary | ICD-10-CM

## 2015-01-21 MED ORDER — BORTEZOMIB CHEMO SQ INJECTION 3.5 MG (2.5MG/ML)
1.3000 mg/m2 | Freq: Once | INTRAMUSCULAR | Status: AC
  Administered 2015-01-21: 2.5 mg via SUBCUTANEOUS
  Filled 2015-01-21: qty 2.5

## 2015-01-21 MED ORDER — ONDANSETRON HCL 8 MG PO TABS: 8.0000 mg | ORAL_TABLET | Freq: Once | ORAL | Status: DC

## 2015-01-21 NOTE — Patient Instructions (Signed)
Bortezomib injection What is this medicine? BORTEZOMIB (bor TEZ oh mib) is a chemotherapy drug. It slows the growth of cancer cells. This medicine is used to treat multiple myeloma, and certain lymphomas, such as mantle-cell lymphoma. This medicine may be used for other purposes; ask your health care provider or pharmacist if you have questions. COMMON BRAND NAME(S): Velcade What should I tell my health care provider before I take this medicine? They need to know if you have any of these conditions: -diabetes -heart disease -irregular heartbeat -liver disease -on hemodialysis -low blood counts, like low white blood cells, platelets, or hemoglobin -peripheral neuropathy -taking medicine for blood pressure -an unusual or allergic reaction to bortezomib, mannitol, boron, other medicines, foods, dyes, or preservatives -pregnant or trying to get pregnant -breast-feeding How should I use this medicine? This medicine is for injection into a vein or for injection under the skin. It is given by a health care professional in a hospital or clinic setting. Talk to your pediatrician regarding the use of this medicine in children. Special care may be needed. Overdosage: If you think you have taken too much of this medicine contact a poison control center or emergency room at once. NOTE: This medicine is only for you. Do not share this medicine with others. What if I miss a dose? It is important not to miss your dose. Call your doctor or health care professional if you are unable to keep an appointment. What may interact with this medicine? This medicine may interact with the following medications: -ketoconazole -rifampin -ritonavir -St. John's Wort This list may not describe all possible interactions. Give your health care provider a list of all the medicines, herbs, non-prescription drugs, or dietary supplements you use. Also tell them if you smoke, drink alcohol, or use illegal drugs. Some items  may interact with your medicine. What should I watch for while using this medicine? Visit your doctor for checks on your progress. This drug may make you feel generally unwell. This is not uncommon, as chemotherapy can affect healthy cells as well as cancer cells. Report any side effects. Continue your course of treatment even though you feel ill unless your doctor tells you to stop. You may get drowsy or dizzy. Do not drive, use machinery, or do anything that needs mental alertness until you know how this medicine affects you. Do not stand or sit up quickly, especially if you are an older patient. This reduces the risk of dizzy or fainting spells. In some cases, you may be given additional medicines to help with side effects. Follow all directions for their use. Call your doctor or health care professional for advice if you get a fever, chills or sore throat, or other symptoms of a cold or flu. Do not treat yourself. This drug decreases your body's ability to fight infections. Try to avoid being around people who are sick. This medicine may increase your risk to bruise or bleed. Call your doctor or health care professional if you notice any unusual bleeding. You may need blood work done while you are taking this medicine. In some patients, this medicine may cause a serious brain infection that may cause death. If you have any problems seeing, thinking, speaking, walking, or standing, tell your doctor right away. If you cannot reach your doctor, urgently seek other source of medical care. Do not become pregnant while taking this medicine. Women should inform their doctor if they wish to become pregnant or think they might be pregnant. There is   a potential for serious side effects to an unborn child. Talk to your health care professional or pharmacist for more information. Do not breast-feed an infant while taking this medicine. Check with your doctor or health care professional if you get an attack of  severe diarrhea, nausea and vomiting, or if you sweat a lot. The loss of too much body fluid can make it dangerous for you to take this medicine. What side effects may I notice from receiving this medicine? Side effects that you should report to your doctor or health care professional as soon as possible: -allergic reactions like skin rash, itching or hives, swelling of the face, lips, or tongue -breathing problems -changes in hearing -changes in vision -fast, irregular heartbeat -feeling faint or lightheaded, falls -pain, tingling, numbness in the hands or feet -right upper belly pain -seizures -swelling of the ankles, feet, hands -unusual bleeding or bruising -unusually weak or tired -vomiting -yellowing of the eyes or skin Side effects that usually do not require medical attention (report to your doctor or health care professional if they continue or are bothersome): -changes in emotions or moods -constipation -diarrhea -loss of appetite -headache -irritation at site where injected -nausea This list may not describe all possible side effects. Call your doctor for medical advice about side effects. You may report side effects to FDA at 1-800-FDA-1088. Where should I keep my medicine? This drug is given in a hospital or clinic and will not be stored at home. NOTE: This sheet is a summary. It may not cover all possible information. If you have questions about this medicine, talk to your doctor, pharmacist, or health care provider.  2015, Elsevier/Gold Standard. (2013-07-22 12:46:32)  

## 2015-01-21 NOTE — Progress Notes (Signed)
No labs per Dr. Marin Olp. Pt will only need labs every 3 weeks.

## 2015-02-04 ENCOUNTER — Other Ambulatory Visit: Payer: Self-pay

## 2015-02-04 ENCOUNTER — Ambulatory Visit: Payer: Self-pay | Admitting: Hematology & Oncology

## 2015-02-04 ENCOUNTER — Ambulatory Visit: Payer: Self-pay

## 2015-02-04 ENCOUNTER — Ambulatory Visit (HOSPITAL_BASED_OUTPATIENT_CLINIC_OR_DEPARTMENT_OTHER): Payer: Commercial Managed Care - PPO

## 2015-02-04 VITALS — BP 126/82 | HR 98 | Temp 98.6°F | Resp 20 | Wt 173.0 lb

## 2015-02-04 DIAGNOSIS — C9000 Multiple myeloma not having achieved remission: Secondary | ICD-10-CM | POA: Diagnosis not present

## 2015-02-04 DIAGNOSIS — Z5112 Encounter for antineoplastic immunotherapy: Secondary | ICD-10-CM

## 2015-02-04 MED ORDER — ONDANSETRON HCL 8 MG PO TABS: 8.0000 mg | ORAL_TABLET | Freq: Once | ORAL | Status: DC

## 2015-02-04 MED ORDER — BORTEZOMIB CHEMO SQ INJECTION 3.5 MG (2.5MG/ML)
1.3000 mg/m2 | Freq: Once | INTRAMUSCULAR | Status: AC
Start: 2015-02-04 — End: 2015-02-04
  Administered 2015-02-04: 2.5 mg via SUBCUTANEOUS
  Filled 2015-02-04: qty 2.5

## 2015-02-04 NOTE — Patient Instructions (Signed)
Bortezomib injection What is this medicine? BORTEZOMIB (bor TEZ oh mib) is a chemotherapy drug. It slows the growth of cancer cells. This medicine is used to treat multiple myeloma, and certain lymphomas, such as mantle-cell lymphoma. This medicine may be used for other purposes; ask your health care provider or pharmacist if you have questions. COMMON BRAND NAME(S): Velcade What should I tell my health care provider before I take this medicine? They need to know if you have any of these conditions: -diabetes -heart disease -irregular heartbeat -liver disease -on hemodialysis -low blood counts, like low white blood cells, platelets, or hemoglobin -peripheral neuropathy -taking medicine for blood pressure -an unusual or allergic reaction to bortezomib, mannitol, boron, other medicines, foods, dyes, or preservatives -pregnant or trying to get pregnant -breast-feeding How should I use this medicine? This medicine is for injection into a vein or for injection under the skin. It is given by a health care professional in a hospital or clinic setting. Talk to your pediatrician regarding the use of this medicine in children. Special care may be needed. Overdosage: If you think you have taken too much of this medicine contact a poison control center or emergency room at once. NOTE: This medicine is only for you. Do not share this medicine with others. What if I miss a dose? It is important not to miss your dose. Call your doctor or health care professional if you are unable to keep an appointment. What may interact with this medicine? This medicine may interact with the following medications: -ketoconazole -rifampin -ritonavir -St. John's Wort This list may not describe all possible interactions. Give your health care provider a list of all the medicines, herbs, non-prescription drugs, or dietary supplements you use. Also tell them if you smoke, drink alcohol, or use illegal drugs. Some items  may interact with your medicine. What should I watch for while using this medicine? Visit your doctor for checks on your progress. This drug may make you feel generally unwell. This is not uncommon, as chemotherapy can affect healthy cells as well as cancer cells. Report any side effects. Continue your course of treatment even though you feel ill unless your doctor tells you to stop. You may get drowsy or dizzy. Do not drive, use machinery, or do anything that needs mental alertness until you know how this medicine affects you. Do not stand or sit up quickly, especially if you are an older patient. This reduces the risk of dizzy or fainting spells. In some cases, you may be given additional medicines to help with side effects. Follow all directions for their use. Call your doctor or health care professional for advice if you get a fever, chills or sore throat, or other symptoms of a cold or flu. Do not treat yourself. This drug decreases your body's ability to fight infections. Try to avoid being around people who are sick. This medicine may increase your risk to bruise or bleed. Call your doctor or health care professional if you notice any unusual bleeding. You may need blood work done while you are taking this medicine. In some patients, this medicine may cause a serious brain infection that may cause death. If you have any problems seeing, thinking, speaking, walking, or standing, tell your doctor right away. If you cannot reach your doctor, urgently seek other source of medical care. Do not become pregnant while taking this medicine. Women should inform their doctor if they wish to become pregnant or think they might be pregnant. There is   a potential for serious side effects to an unborn child. Talk to your health care professional or pharmacist for more information. Do not breast-feed an infant while taking this medicine. Check with your doctor or health care professional if you get an attack of  severe diarrhea, nausea and vomiting, or if you sweat a lot. The loss of too much body fluid can make it dangerous for you to take this medicine. What side effects may I notice from receiving this medicine? Side effects that you should report to your doctor or health care professional as soon as possible: -allergic reactions like skin rash, itching or hives, swelling of the face, lips, or tongue -breathing problems -changes in hearing -changes in vision -fast, irregular heartbeat -feeling faint or lightheaded, falls -pain, tingling, numbness in the hands or feet -right upper belly pain -seizures -swelling of the ankles, feet, hands -unusual bleeding or bruising -unusually weak or tired -vomiting -yellowing of the eyes or skin Side effects that usually do not require medical attention (report to your doctor or health care professional if they continue or are bothersome): -changes in emotions or moods -constipation -diarrhea -loss of appetite -headache -irritation at site where injected -nausea This list may not describe all possible side effects. Call your doctor for medical advice about side effects. You may report side effects to FDA at 1-800-FDA-1088. Where should I keep my medicine? This drug is given in a hospital or clinic and will not be stored at home. NOTE: This sheet is a summary. It may not cover all possible information. If you have questions about this medicine, talk to your doctor, pharmacist, or health care provider.  2015, Elsevier/Gold Standard. (2013-07-22 12:46:32)  

## 2015-02-18 ENCOUNTER — Other Ambulatory Visit: Payer: Self-pay

## 2015-02-18 ENCOUNTER — Ambulatory Visit (HOSPITAL_BASED_OUTPATIENT_CLINIC_OR_DEPARTMENT_OTHER): Payer: Commercial Managed Care - PPO

## 2015-02-18 ENCOUNTER — Other Ambulatory Visit (HOSPITAL_BASED_OUTPATIENT_CLINIC_OR_DEPARTMENT_OTHER): Payer: Commercial Managed Care - PPO

## 2015-02-18 ENCOUNTER — Other Ambulatory Visit: Payer: Self-pay | Admitting: *Deleted

## 2015-02-18 VITALS — BP 131/78 | HR 109 | Temp 100.5°F | Resp 18

## 2015-02-18 DIAGNOSIS — R21 Rash and other nonspecific skin eruption: Secondary | ICD-10-CM | POA: Diagnosis not present

## 2015-02-18 DIAGNOSIS — C9000 Multiple myeloma not having achieved remission: Secondary | ICD-10-CM

## 2015-02-18 LAB — CBC WITH DIFFERENTIAL (CANCER CENTER ONLY)
BASO#: 0 10*3/uL (ref 0.0–0.2)
BASO%: 0.3 % (ref 0.0–2.0)
EOS%: 5.7 % (ref 0.0–7.0)
Eosinophils Absolute: 0.2 10*3/uL (ref 0.0–0.5)
HCT: 30.6 % — ABNORMAL LOW (ref 34.8–46.6)
HEMOGLOBIN: 10.4 g/dL — AB (ref 11.6–15.9)
LYMPH#: 2.5 10*3/uL (ref 0.9–3.3)
LYMPH%: 63.7 % — AB (ref 14.0–48.0)
MCH: 34.4 pg — ABNORMAL HIGH (ref 26.0–34.0)
MCHC: 34 g/dL (ref 32.0–36.0)
MCV: 101 fL (ref 81–101)
MONO#: 0.5 10*3/uL (ref 0.1–0.9)
MONO%: 13.4 % — ABNORMAL HIGH (ref 0.0–13.0)
NEUT#: 0.7 10*3/uL — ABNORMAL LOW (ref 1.5–6.5)
NEUT%: 16.9 % — AB (ref 39.6–80.0)
Platelets: 119 10*3/uL — ABNORMAL LOW (ref 145–400)
RBC: 3.02 10*6/uL — ABNORMAL LOW (ref 3.70–5.32)
RDW: 12.8 % (ref 11.1–15.7)
WBC: 3.9 10*3/uL (ref 3.9–10.0)

## 2015-02-18 LAB — CMP (CANCER CENTER ONLY)
ALT: 23 U/L (ref 10–47)
AST: 39 U/L — AB (ref 11–38)
Albumin: 3.2 g/dL — ABNORMAL LOW (ref 3.3–5.5)
Alkaline Phosphatase: 64 U/L (ref 26–84)
BILIRUBIN TOTAL: 0.6 mg/dL (ref 0.20–1.60)
BUN, Bld: 10 mg/dL (ref 7–22)
CO2: 21 mEq/L (ref 18–33)
CREATININE: 1.1 mg/dL (ref 0.6–1.2)
Calcium: 8.8 mg/dL (ref 8.0–10.3)
Chloride: 104 mEq/L (ref 98–108)
Glucose, Bld: 140 mg/dL — ABNORMAL HIGH (ref 73–118)
Potassium: 3.8 mEq/L (ref 3.3–4.7)
SODIUM: 139 meq/L (ref 128–145)
TOTAL PROTEIN: 6.7 g/dL (ref 6.4–8.1)

## 2015-02-18 MED ORDER — METHYLPREDNISOLONE 4 MG PO TBPK: ORAL_TABLET | ORAL | Status: DC

## 2015-02-18 MED ORDER — FAMOTIDINE 20 MG PO TABS
40.0000 mg | ORAL_TABLET | Freq: Once | ORAL | Status: AC
  Administered 2015-02-18: 40 mg via ORAL

## 2015-02-18 NOTE — Progress Notes (Signed)
Patient complaining of generalized rash onset 1-2 weeks ago. States about a week ago she had a headache; and woke up in a sweat. States headache and feverish feeling resolved. Dr Marin Olp at bedside to evaluate patient; per Dr Marin Olp, hold Velcade today and prescribed Medrol dose pack and OTC pepcid for rash. Encouraged patient to call for any concerns and to return on next scheduled appointment. Patient verbalized understanding.

## 2015-02-18 NOTE — Progress Notes (Signed)
I saw the patient in the treatment area. She was in for her Velcade. She had developed a rash for the past week. This is an urticarial rash. She thinks it happened after she went out to dinner with some of her friends.  She try to Benadryl at home for this.  She has a little bit of pruritus with the rash.  She's had no problems with bowels or bladder. She had no temperature. She had no chills.  When I examined her, she had areas of urticaria and macular lesions. There are no blisters.  It is hard say what causes rash. There's been no change in her medications. I don't think is from anything she is taking right now. She is not on Revlimid.  I did go ahead and give her some Pepcid in the office. She 40 mg.  I gave her prescription for a Medrol dose pack.  We will hold her Velcade today. We'll just plan to see her back for her regular appointment.  Jennifer Boyle

## 2015-02-18 NOTE — Patient Instructions (Signed)

## 2015-02-20 LAB — PROTEIN ELECTROPHORESIS, SERUM, WITH REFLEX
ABNORMAL PROTEIN BAND2: 0.3 g/dL
ABNORMAL PROTEIN BAND3: NOT DETECTED g/dL
ALPHA-1-GLOBULIN: 0.3 g/dL (ref 0.2–0.3)
Abnormal Protein Band1: 0.2 g/dL
Albumin ELP: 3.4 g/dL — ABNORMAL LOW (ref 3.8–4.8)
Alpha-2-Globulin: 0.8 g/dL (ref 0.5–0.9)
Beta 2: 0.2 g/dL (ref 0.2–0.5)
Beta Globulin: 0.3 g/dL — ABNORMAL LOW (ref 0.4–0.6)
Gamma Globulin: 1.3 g/dL (ref 0.8–1.7)
TOTAL PROTEIN, SERUM ELECTROPHOR: 6.3 g/dL (ref 6.1–8.1)

## 2015-02-20 LAB — LACTATE DEHYDROGENASE: LDH: 390 U/L — AB (ref 94–250)

## 2015-02-20 LAB — IGG, IGA, IGM
IGM, SERUM: 105 mg/dL (ref 52–322)
IgA: 58 mg/dL — ABNORMAL LOW (ref 69–380)
IgG (Immunoglobin G), Serum: 1520 mg/dL (ref 690–1700)

## 2015-02-20 LAB — KAPPA/LAMBDA LIGHT CHAINS
KAPPA FREE LGHT CHN: 10.8 mg/dL — AB (ref 0.33–1.94)
Kappa:Lambda Ratio: 1.97 — ABNORMAL HIGH (ref 0.26–1.65)
Lambda Free Lght Chn: 5.49 mg/dL — ABNORMAL HIGH (ref 0.57–2.63)

## 2015-02-20 LAB — IFE INTERPRETATION

## 2015-03-04 ENCOUNTER — Ambulatory Visit (HOSPITAL_BASED_OUTPATIENT_CLINIC_OR_DEPARTMENT_OTHER): Payer: Commercial Managed Care - PPO | Admitting: Hematology & Oncology

## 2015-03-04 ENCOUNTER — Ambulatory Visit (HOSPITAL_BASED_OUTPATIENT_CLINIC_OR_DEPARTMENT_OTHER): Payer: Commercial Managed Care - PPO

## 2015-03-04 ENCOUNTER — Encounter: Payer: Self-pay | Admitting: Hematology & Oncology

## 2015-03-04 VITALS — BP 120/77 | HR 97 | Temp 98.2°F | Wt 169.0 lb

## 2015-03-04 DIAGNOSIS — C9000 Multiple myeloma not having achieved remission: Secondary | ICD-10-CM

## 2015-03-04 DIAGNOSIS — B20 Human immunodeficiency virus [HIV] disease: Secondary | ICD-10-CM | POA: Diagnosis not present

## 2015-03-04 DIAGNOSIS — Z5112 Encounter for antineoplastic immunotherapy: Secondary | ICD-10-CM

## 2015-03-04 DIAGNOSIS — Z9484 Stem cells transplant status: Secondary | ICD-10-CM | POA: Diagnosis not present

## 2015-03-04 LAB — CBC WITH DIFFERENTIAL (CANCER CENTER ONLY)
BASO#: 0 10*3/uL (ref 0.0–0.2)
BASO%: 0.2 % (ref 0.0–2.0)
EOS ABS: 0.1 10*3/uL (ref 0.0–0.5)
EOS%: 1.8 % (ref 0.0–7.0)
HCT: 33.9 % — ABNORMAL LOW (ref 34.8–46.6)
HEMOGLOBIN: 11.4 g/dL — AB (ref 11.6–15.9)
LYMPH#: 2.5 10*3/uL (ref 0.9–3.3)
LYMPH%: 50.3 % — ABNORMAL HIGH (ref 14.0–48.0)
MCH: 34.3 pg — AB (ref 26.0–34.0)
MCHC: 33.6 g/dL (ref 32.0–36.0)
MCV: 102 fL — AB (ref 81–101)
MONO#: 0.4 10*3/uL (ref 0.1–0.9)
MONO%: 9 % (ref 0.0–13.0)
NEUT%: 38.7 % — ABNORMAL LOW (ref 39.6–80.0)
NEUTROS ABS: 1.9 10*3/uL (ref 1.5–6.5)
Platelets: 172 10*3/uL (ref 145–400)
RBC: 3.32 10*6/uL — AB (ref 3.70–5.32)
RDW: 13 % (ref 11.1–15.7)
WBC: 4.9 10*3/uL (ref 3.9–10.0)

## 2015-03-04 LAB — CMP (CANCER CENTER ONLY)
ALK PHOS: 79 U/L (ref 26–84)
ALT(SGPT): 19 U/L (ref 10–47)
AST: 25 U/L (ref 11–38)
Albumin: 3.1 g/dL — ABNORMAL LOW (ref 3.3–5.5)
BUN: 12 mg/dL (ref 7–22)
CALCIUM: 9.2 mg/dL (ref 8.0–10.3)
CO2: 29 mEq/L (ref 18–33)
CREATININE: 0.9 mg/dL (ref 0.6–1.2)
Chloride: 108 mEq/L (ref 98–108)
Glucose, Bld: 131 mg/dL — ABNORMAL HIGH (ref 73–118)
Potassium: 3.8 mEq/L (ref 3.3–4.7)
Sodium: 142 mEq/L (ref 128–145)
Total Bilirubin: 0.8 mg/dl (ref 0.20–1.60)
Total Protein: 6.6 g/dL (ref 6.4–8.1)

## 2015-03-04 MED ORDER — SODIUM CHLORIDE 0.9 % IJ SOLN
10.0000 mL | INTRAMUSCULAR | Status: DC | PRN
  Administered 2015-03-04: 10 mL
  Filled 2015-03-04: qty 10

## 2015-03-04 MED ORDER — BORTEZOMIB CHEMO SQ INJECTION 3.5 MG (2.5MG/ML)
1.3000 mg/m2 | Freq: Once | INTRAMUSCULAR | Status: AC
  Administered 2015-03-04: 2.5 mg via SUBCUTANEOUS
  Filled 2015-03-04: qty 2.5

## 2015-03-04 MED ORDER — ONDANSETRON HCL 8 MG PO TABS: 8.0000 mg | ORAL_TABLET | Freq: Once | ORAL | Status: DC

## 2015-03-04 MED ORDER — HEPARIN SOD (PORK) LOCK FLUSH 100 UNIT/ML IV SOLN
500.0000 [IU] | Freq: Once | INTRAVENOUS | Status: AC | PRN
  Administered 2015-03-04: 500 [IU]
  Filled 2015-03-04: qty 5

## 2015-03-04 NOTE — Progress Notes (Signed)
Hematology and Oncology Follow Up Visit  Vincie Linn [id number] Mar 22, 1966 49 y.o. 03/04/2015   Principle Diagnosis:   IgG kappa myeloma   HIV-positive  Current Therapy:    S/p BMT - Autologous at [facility] in 05/2014  Velcade -every 3 week maintenance dosing     Interim History:  Ms.  Bains is back for followup. She is doing pretty well. I saw her a couple weeks ago. She had this skin rash. We gave her some steroids and some Pepcid. This seemed to help with the rash.  Her last monoclonal studies back in mid May showed her M spike to be stable at 0.2 g/dL. Her IgG level was 1520 mg/dL. Her Kappa Light chain was 10.8 mg/dL. This was up a little bit.  She is still working at [employer]. She now is experiencing the pain of transforming over to the [place] record system. I feel bad for her and for the doctors over there.  Her appetite has been good. Her HIV has been under good control.  She's had no change in bowel or bladder habits.  Currently, her performance status is ECOG 1.  Medications:  Current outpatient prescriptions:  .  Abacavir-Dolutegravir-Lamivud 600-50-300 MG TABS, Take 1 tablet by mouth every morning., Disp: 30 tablet, Rfl: 5 .  calcium carbonate (OS-CAL) 600 MG TABS tablet, Take 600 mg by mouth daily with breakfast., Disp: , Rfl:  .  cholecalciferol (VITAMIN D) 1000 UNITS tablet, Take 1 tablet (1,000 Units total) by mouth daily., Disp: 30 tablet, Rfl: 3 .  docusate sodium (COLACE) 100 MG capsule, Take 100 mg by mouth 2 (two) times daily., Disp: , Rfl:  .  lidocaine-prilocaine (EMLA) cream, Apply topically as needed. Apply to portacath one hour before chemo access or blood draw., Disp: 30 g, Rfl: 3 .  ondansetron (ZOFRAN) 8 MG tablet, Take 8 mg by mouth every 8 (eight) hours as needed. , Disp: , Rfl:  .  polyethylene glycol (MIRALAX / GLYCOLAX) packet, Take 17 g by mouth as needed. , Disp: , Rfl:  .  TraMADol HCl 50 MG TBDP, Take by mouth every 6  (six) hours as needed., Disp: , Rfl:  .  valACYclovir (VALTREX) 500 MG tablet, TAKE 1 TABLET (500 MG TOTAL) BY MOUTH DAILY., Disp: 30 tablet, Rfl: 4 No current facility-administered medications for this visit.  Facility-Administered Medications Ordered in Other Visits:  .  0.9 %  sodium chloride infusion, , Intravenous, Once, Chauncey Cruel, MD .  ondansetron [facility]) tablet 8 mg, 8 mg, Oral, Once, Chauncey Cruel, MD, 8 mg at 12/09/14 1401  Allergies:  Allergies  Allergen Reactions  . Aspirin     Upset stomach   . Efavirenz Hives and Rash    (brand name sustiva)    Past Medical History, Surgical history, Social history, and Family History were reviewed and updated.  Review of Systems: As above  Physical Exam:  weight is 169 lb (76.658 kg). Her oral temperature is 98.2 F (36.8 C). Her blood pressure is 120/77 and her pulse is 97.   [name]. Head and exam shows no ocular or oral lesions. There are no palpable cervical or supraclavicular lymph nodes. Lungs are clear. Cardiac exam regular rate and rhythm with no murmurs rubs or bruits. Abdomen is soft. She has good bowel sounds. There is no fluid wave. There is no palpable liver or spleen tip. Back exam shows no tenderness over the spine, ribs or hips. Extremities  shows no clubbing, cyanosis or edema. Neurological exam shows no focal neurological deficits. Skin exam no rashes, ecchymosis or petechia.  Lab Results  Component Value Date   WBC 4.9 03/04/2015   HGB 11.4* 03/04/2015   HCT 33.9* 03/04/2015   MCV 102* 03/04/2015   PLT 172 03/04/2015     Chemistry      Component Value Date/Time   NA 142 03/04/2015 1352   NA 143 12/23/2014 1239   NA 141 04/04/2014 0852   K 3.8 03/04/2015 1352   K 4.3 12/23/2014 1239   K 4.2 04/04/2014 0852   CL 108 03/04/2015 1352   CL 110 12/23/2014 1239   CL 111* 03/29/2013 1351   CO2 29 03/04/2015 1352   CO2 22 12/23/2014 1239   CO2 22 04/04/2014  0852   BUN 12 03/04/2015 1352   BUN 20 12/23/2014 1239   BUN 20.9 04/04/2014 0852   CREATININE 0.9 03/04/2015 1352   CREATININE 1.01 12/23/2014 1239   CREATININE 1.2* 04/04/2014 0852      Component Value Date/Time   CALCIUM 9.2 03/04/2015 1352   CALCIUM 9.2 12/23/2014 1239   CALCIUM 9.4 04/04/2014 0852   ALKPHOS 79 03/04/2015 1352   ALKPHOS 89 12/23/2014 1239   ALKPHOS 94 04/04/2014 0852   AST 25 03/04/2015 1352   AST 19 12/23/2014 1239   AST 20 04/04/2014 0852   ALT 19 03/04/2015 1352   ALT 17 12/23/2014 1239   ALT 17 04/04/2014 0852   BILITOT 0.80 03/04/2015 1352   BILITOT 0.3 12/23/2014 1239   BILITOT 0.23 04/04/2014 0852         Impression and Plan: Ms. Gomm is 49 year old African [place] female. She has IgG kappa myeloma. She ultimately underwent a stem cell transplant. This was back on August 14. This was done at [facility].  I'm not sure as to why the kappa light chain was elevated. This will have to be watched. I would like to think that I'm checking it today. If not, we will check it when we see her back area did  She is on maintenance therapy with Velcade.. This was suggested by her transplant doctors down at [facility]. We are moving her to every 3 week Velcade. I really think this would be reasonable for her. It would make things a little bit easier for her. I really do not see that there would be a downside to every 3 week therapy.   I will plan to see her back in 6 weeks.   Volanda Napoleon, MD 5/25/20162:55 PM

## 2015-03-07 LAB — PROTEIN ELECTROPHORESIS, SERUM, WITH REFLEX
ABNORMAL PROTEIN BAND1: 0.2 g/dL
ALPHA-1-GLOBULIN: 0.3 g/dL (ref 0.2–0.3)
ALPHA-2-GLOBULIN: 0.8 g/dL (ref 0.5–0.9)
Abnormal Protein Band2: 0.3 g/dL
Albumin ELP: 3.4 g/dL — ABNORMAL LOW (ref 3.8–4.8)
BETA GLOBULIN: 0.3 g/dL — AB (ref 0.4–0.6)
Beta 2: 0.2 g/dL (ref 0.2–0.5)
Gamma Globulin: 1.3 g/dL (ref 0.8–1.7)
TOTAL PROTEIN, SERUM ELECTROPHOR: 6.3 g/dL (ref 6.1–8.1)

## 2015-03-07 LAB — IGG, IGA, IGM
IGG (IMMUNOGLOBIN G), SERUM: 1500 mg/dL (ref 690–1700)
IGM, SERUM: 109 mg/dL (ref 52–322)
IgA: 71 mg/dL (ref 69–380)

## 2015-03-07 LAB — IFE INTERPRETATION

## 2015-03-19 ENCOUNTER — Telehealth: Payer: Self-pay | Admitting: Hematology & Oncology

## 2015-03-19 NOTE — Telephone Encounter (Signed)
I actually spoke w 4 diff pep in 4 diff dept w [facility] and they all advised, NPR for op chemo. As well as, no pre-determination.   The last rep Theotis Barrio (case mgmt dept) - Ref: [id number]  P: [phone] P: [phone] P: [phone] P; [phone]  Passcode: [id number]  F: [phone] F: [phone]     [id number] Velcade

## 2015-03-25 ENCOUNTER — Ambulatory Visit (HOSPITAL_BASED_OUTPATIENT_CLINIC_OR_DEPARTMENT_OTHER): Payer: Commercial Managed Care - PPO

## 2015-03-25 VITALS — BP 141/80 | HR 88 | Temp 98.0°F | Resp 20

## 2015-03-25 DIAGNOSIS — Z5112 Encounter for antineoplastic immunotherapy: Secondary | ICD-10-CM | POA: Diagnosis not present

## 2015-03-25 DIAGNOSIS — C9000 Multiple myeloma not having achieved remission: Secondary | ICD-10-CM | POA: Diagnosis not present

## 2015-03-25 MED ORDER — BORTEZOMIB CHEMO SQ INJECTION 3.5 MG (2.5MG/ML)
1.3000 mg/m2 | Freq: Once | INTRAMUSCULAR | Status: AC
  Administered 2015-03-25: 2.5 mg via SUBCUTANEOUS
  Filled 2015-03-25: qty 2.5

## 2015-03-25 NOTE — Patient Instructions (Signed)
Bortezomib injection What is this medicine? BORTEZOMIB (bor TEZ oh mib) is a chemotherapy drug. It slows the growth of cancer cells. This medicine is used to treat multiple myeloma, and certain lymphomas, such as mantle-cell lymphoma. This medicine may be used for other purposes; ask your health care provider or pharmacist if you have questions. COMMON BRAND NAME(S): Velcade What should I tell my health care provider before I take this medicine? They need to know if you have any of these conditions: -diabetes -heart disease -irregular heartbeat -liver disease -on hemodialysis -low blood counts, like low white blood cells, platelets, or hemoglobin -peripheral neuropathy -taking medicine for blood pressure -an unusual or allergic reaction to bortezomib, mannitol, boron, other medicines, foods, dyes, or preservatives -pregnant or trying to get pregnant -breast-feeding How should I use this medicine? This medicine is for injection into a vein or for injection under the skin. It is given by a health care professional in a hospital or clinic setting. Talk to your pediatrician regarding the use of this medicine in children. Special care may be needed. Overdosage: If you think you have taken too much of this medicine contact a poison control center or emergency room at once. NOTE: This medicine is only for you. Do not share this medicine with others. What if I miss a dose? It is important not to miss your dose. Call your doctor or health care professional if you are unable to keep an appointment. What may interact with this medicine? This medicine may interact with the following medications: -ketoconazole -rifampin -ritonavir -St. John's Wort This list may not describe all possible interactions. Give your health care provider a list of all the medicines, herbs, non-prescription drugs, or dietary supplements you use. Also tell them if you smoke, drink alcohol, or use illegal drugs. Some items  may interact with your medicine. What should I watch for while using this medicine? Visit your doctor for checks on your progress. This drug may make you feel generally unwell. This is not uncommon, as chemotherapy can affect healthy cells as well as cancer cells. Report any side effects. Continue your course of treatment even though you feel ill unless your doctor tells you to stop. You may get drowsy or dizzy. Do not drive, use machinery, or do anything that needs mental alertness until you know how this medicine affects you. Do not stand or sit up quickly, especially if you are an older patient. This reduces the risk of dizzy or fainting spells. In some cases, you may be given additional medicines to help with side effects. Follow all directions for their use. Call your doctor or health care professional for advice if you get a fever, chills or sore throat, or other symptoms of a cold or flu. Do not treat yourself. This drug decreases your body's ability to fight infections. Try to avoid being around people who are sick. This medicine may increase your risk to bruise or bleed. Call your doctor or health care professional if you notice any unusual bleeding. You may need blood work done while you are taking this medicine. In some patients, this medicine may cause a serious brain infection that may cause death. If you have any problems seeing, thinking, speaking, walking, or standing, tell your doctor right away. If you cannot reach your doctor, urgently seek other source of medical care. Do not become pregnant while taking this medicine. Women should inform their doctor if they wish to become pregnant or think they might be pregnant. There is   a potential for serious side effects to an unborn child. Talk to your health care professional or pharmacist for more information. Do not breast-feed an infant while taking this medicine. Check with your doctor or health care professional if you get an attack of  severe diarrhea, nausea and vomiting, or if you sweat a lot. The loss of too much body fluid can make it dangerous for you to take this medicine. What side effects may I notice from receiving this medicine? Side effects that you should report to your doctor or health care professional as soon as possible: -allergic reactions like skin rash, itching or hives, swelling of the face, lips, or tongue -breathing problems -changes in hearing -changes in vision -fast, irregular heartbeat -feeling faint or lightheaded, falls -pain, tingling, numbness in the hands or feet -right upper belly pain -seizures -swelling of the ankles, feet, hands -unusual bleeding or bruising -unusually weak or tired -vomiting -yellowing of the eyes or skin Side effects that usually do not require medical attention (report to your doctor or health care professional if they continue or are bothersome): -changes in emotions or moods -constipation -diarrhea -loss of appetite -headache -irritation at site where injected -nausea This list may not describe all possible side effects. Call your doctor for medical advice about side effects. You may report side effects to FDA at 1-800-FDA-1088. Where should I keep my medicine? This drug is given in a hospital or clinic and will not be stored at home. NOTE: This sheet is a summary. It may not cover all possible information. If you have questions about this medicine, talk to your doctor, pharmacist, or health care provider.  2015, Elsevier/Gold Standard. (2013-07-22 12:46:32)  

## 2015-04-15 ENCOUNTER — Other Ambulatory Visit: Payer: Self-pay | Admitting: *Deleted

## 2015-04-15 ENCOUNTER — Ambulatory Visit (HOSPITAL_BASED_OUTPATIENT_CLINIC_OR_DEPARTMENT_OTHER): Payer: Commercial Managed Care - PPO | Admitting: Hematology & Oncology

## 2015-04-15 ENCOUNTER — Encounter: Payer: Self-pay | Admitting: Hematology & Oncology

## 2015-04-15 ENCOUNTER — Ambulatory Visit (HOSPITAL_BASED_OUTPATIENT_CLINIC_OR_DEPARTMENT_OTHER): Payer: Commercial Managed Care - PPO

## 2015-04-15 VITALS — BP 120/83 | HR 99 | Temp 98.2°F | Resp 14 | Ht 64.0 in | Wt 168.0 lb

## 2015-04-15 DIAGNOSIS — C9000 Multiple myeloma not having achieved remission: Secondary | ICD-10-CM

## 2015-04-15 DIAGNOSIS — C9002 Multiple myeloma in relapse: Secondary | ICD-10-CM

## 2015-04-15 DIAGNOSIS — Z5112 Encounter for antineoplastic immunotherapy: Secondary | ICD-10-CM | POA: Diagnosis not present

## 2015-04-15 DIAGNOSIS — Z9484 Stem cells transplant status: Secondary | ICD-10-CM | POA: Diagnosis not present

## 2015-04-15 LAB — CMP (CANCER CENTER ONLY)
ALT(SGPT): 23 U/L (ref 10–47)
AST: 27 U/L (ref 11–38)
Albumin: 3.4 g/dL (ref 3.3–5.5)
Alkaline Phosphatase: 95 U/L — ABNORMAL HIGH (ref 26–84)
BILIRUBIN TOTAL: 0.7 mg/dL (ref 0.20–1.60)
BUN: 18 mg/dL (ref 7–22)
CALCIUM: 9.7 mg/dL (ref 8.0–10.3)
CO2: 25 meq/L (ref 18–33)
CREATININE: 1.1 mg/dL (ref 0.6–1.2)
Chloride: 106 mEq/L (ref 98–108)
Glucose, Bld: 105 mg/dL (ref 73–118)
Potassium: 4 mEq/L (ref 3.3–4.7)
Sodium: 139 mEq/L (ref 128–145)
Total Protein: 7.6 g/dL (ref 6.4–8.1)

## 2015-04-15 LAB — CBC WITH DIFFERENTIAL (CANCER CENTER ONLY)
BASO#: 0 10*3/uL (ref 0.0–0.2)
BASO%: 0.2 % (ref 0.0–2.0)
EOS%: 0.4 % (ref 0.0–7.0)
Eosinophils Absolute: 0 10*3/uL (ref 0.0–0.5)
HEMATOCRIT: 37.3 % (ref 34.8–46.6)
HEMOGLOBIN: 13.1 g/dL (ref 11.6–15.9)
LYMPH#: 2.2 10*3/uL (ref 0.9–3.3)
LYMPH%: 48.3 % — AB (ref 14.0–48.0)
MCH: 35.4 pg — ABNORMAL HIGH (ref 26.0–34.0)
MCHC: 35.1 g/dL (ref 32.0–36.0)
MCV: 101 fL (ref 81–101)
MONO#: 0.4 10*3/uL (ref 0.1–0.9)
MONO%: 9.3 % (ref 0.0–13.0)
NEUT#: 1.9 10*3/uL (ref 1.5–6.5)
NEUT%: 41.8 % (ref 39.6–80.0)
Platelets: 165 10*3/uL (ref 145–400)
RBC: 3.7 10*6/uL (ref 3.70–5.32)
RDW: 14.4 % (ref 11.1–15.7)
WBC: 4.6 10*3/uL (ref 3.9–10.0)

## 2015-04-15 MED ORDER — SODIUM CHLORIDE 0.9 % IV SOLN
Freq: Once | INTRAVENOUS | Status: AC
  Administered 2015-04-15: 14:00:00 via INTRAVENOUS

## 2015-04-15 MED ORDER — ALTEPLASE 2 MG IJ SOLR
2.0000 mg | Freq: Once | INTRAMUSCULAR | Status: DC | PRN
  Filled 2015-04-15: qty 2

## 2015-04-15 MED ORDER — HEPARIN SOD (PORK) LOCK FLUSH 100 UNIT/ML IV SOLN
250.0000 [IU] | Freq: Once | INTRAVENOUS | Status: DC | PRN
  Filled 2015-04-15: qty 5

## 2015-04-15 MED ORDER — IXAZOMIB CITRATE 4 MG PO CAPS: ORAL_CAPSULE | ORAL | Status: DC

## 2015-04-15 MED ORDER — ZOLEDRONIC ACID 4 MG/100ML IV SOLN
4.0000 mg | Freq: Once | INTRAVENOUS | Status: AC
  Administered 2015-04-15: 4 mg via INTRAVENOUS
  Filled 2015-04-15: qty 100

## 2015-04-15 MED ORDER — SODIUM CHLORIDE 0.9 % IV SOLN: Freq: Once | INTRAVENOUS | Status: DC

## 2015-04-15 MED ORDER — HEPARIN SOD (PORK) LOCK FLUSH 100 UNIT/ML IV SOLN
500.0000 [IU] | Freq: Once | INTRAVENOUS | Status: AC | PRN
  Administered 2015-04-15: 500 [IU]
  Filled 2015-04-15: qty 5

## 2015-04-15 MED ORDER — ONDANSETRON HCL 8 MG PO TABS
ORAL_TABLET | ORAL | Status: AC
Start: 2015-04-15 — End: 2015-04-15
  Filled 2015-04-15: qty 1

## 2015-04-15 MED ORDER — SODIUM CHLORIDE 0.9 % IJ SOLN
10.0000 mL | INTRAMUSCULAR | Status: DC | PRN
  Administered 2015-04-15: 10 mL
  Filled 2015-04-15: qty 10

## 2015-04-15 MED ORDER — ONDANSETRON HCL 8 MG PO TABS: 8.0000 mg | ORAL_TABLET | Freq: Once | ORAL | Status: DC

## 2015-04-15 MED ORDER — BORTEZOMIB CHEMO SQ INJECTION 3.5 MG (2.5MG/ML)
1.3000 mg/m2 | Freq: Once | INTRAMUSCULAR | Status: AC
  Administered 2015-04-15: 2.5 mg via SUBCUTANEOUS
  Filled 2015-04-15: qty 2.5

## 2015-04-15 MED ORDER — SODIUM CHLORIDE 0.9 % IJ SOLN
3.0000 mL | Freq: Once | INTRAMUSCULAR | Status: DC | PRN
  Filled 2015-04-15: qty 10

## 2015-04-15 NOTE — Progress Notes (Signed)
Hematology and Oncology Follow Up Visit  Lyla Jasek [id number] May 28, 1966 49 y.o. 04/15/2015   Principle Diagnosis:   IgG kappa myeloma - relapsed   HIV-positive  Current Therapy:    S/p BMT - Autologous at [facility] in 05/2014  Velcade -every 3 week maintenance dosing     Interim History:  Ms.  Mcnee is back for followup.she is doing okay. She is a little bit tired. She worked at the hospital over the holidays.  She is really having a hard time with the Velcade. The Velcade really bothers her. She is having a hard time affording it.  I think it would be very reasonable to consider her for Ninlaro. This is the oral version of Velcade. I think this would be very reasonable. Hopefully, her insurance will pay for it.  Her last myeloma studies looked a little bit worse. She had 2 monoclonal spikes that measured 0.5 g/dL. This is up a little bit. Her IgG level was 1500 mg/dL. Her last serum Kappa Lightchain was 10.8 mg/dL which is up.  She has not had any skin issues. She had a rash before which is improving.  There is no problems going to the bathroom. She's had no nausea or vomiting. She has had no fever. There's been no leg swelling.  Currently, her performance status is ECOG 1.  Medications:  Current outpatient prescriptions:  .  Abacavir-Dolutegravir-Lamivud 600-50-300 MG TABS, Take 1 tablet by mouth every morning., Disp: 30 tablet, Rfl: 5 .  calcium carbonate (OS-CAL) 600 MG TABS tablet, Take 600 mg by mouth daily with breakfast., Disp: , Rfl:  .  cholecalciferol (VITAMIN D) 1000 UNITS tablet, Take 1 tablet (1,000 Units total) by mouth daily., Disp: 30 tablet, Rfl: 3 .  docusate sodium (COLACE) 100 MG capsule, Take 100 mg by mouth 2 (two) times daily., Disp: , Rfl:  .  lidocaine-prilocaine (EMLA) cream, Apply topically as needed. Apply to portacath one hour before chemo access or blood draw., Disp: 30 g, Rfl: 3 .  ondansetron (ZOFRAN) 8 MG tablet, Take 8 mg by mouth every 8  (eight) hours as needed. , Disp: , Rfl:  .  polyethylene glycol (MIRALAX / GLYCOLAX) packet, Take 17 g by mouth as needed. , Disp: , Rfl:  .  TraMADol HCl 50 MG TBDP, Take by mouth every 6 (six) hours as needed., Disp: , Rfl:  .  valACYclovir (VALTREX) 500 MG tablet, TAKE 1 TABLET (500 MG TOTAL) BY MOUTH DAILY., Disp: 30 tablet, Rfl: 4 .  ixazomib citrate (NINLARO) 4 MG capsule, Take one pill on an empty stomach every 3 weeks., Disp: 3 capsule, Rfl: 3 No current facility-administered medications for this visit.  Facility-Administered Medications Ordered in Other Visits:  .  0.9 %  sodium chloride infusion, , Intravenous, Once, Chauncey Cruel, MD .  0.9 %  sodium chloride infusion, , Intravenous, Once, Volanda Napoleon, MD .  0.9 %  sodium chloride infusion, , Intravenous, Once, Volanda Napoleon, MD .  alteplase (CATHFLO ACTIVASE) injection 2 mg, 2 mg, Intracatheter, Once PRN, Volanda Napoleon, MD .  bortezomib SQ (VELCADE) chemo injection 2.5 mg, 1.3 mg/m2 (Treatment Plan Actual), Subcutaneous, Once, Volanda Napoleon, MD .  heparin lock flush 100 unit/mL, 500 Units, Intracatheter, Once PRN, Volanda Napoleon, MD .  heparin lock flush 100 unit/mL, 250 Units, Intracatheter, Once PRN, Volanda Napoleon, MD .  ondansetron [facility]) tablet 8 mg, 8 mg, Oral, Once, Chauncey Cruel, MD, 8 mg at 12/09/14 1401 .  ondansetron (ZOFRAN) tablet 8 mg, 8 mg, Oral, Once, Volanda Napoleon, MD, 8 mg at 04/15/15 1410 .  sodium chloride 0.9 % injection 10 mL, 10 mL, Intracatheter, PRN, Volanda Napoleon, MD .  sodium chloride 0.9 % injection 3 mL, 3 mL, Intravenous, Once PRN, Volanda Napoleon, MD .  Zoledronic Acid (ZOMETA) 4 mg IVPB, 4 mg, Intravenous, Once, Volanda Napoleon, MD  Allergies:  Allergies  Allergen Reactions  . Aspirin     Upset stomach   . Efavirenz Hives and Rash    (brand name sustiva)    Past Medical History, Surgical history, Social history, and Family History were reviewed and updated.  Review  of Systems: As above  Physical Exam:  height is 5\' 4"  (1.626 m) and weight is 168 lb (76.204 kg). Her oral temperature is 98.2 F (36.8 C). Her blood pressure is 120/83 and her pulse is 99. Her respiration is 14.   [name]. Head and exam shows no ocular or oral lesions. There are no palpable cervical or supraclavicular lymph nodes. Lungs are clear. Cardiac exam regular rate and rhythm with no murmurs rubs or bruits. Abdomen is soft. She has good bowel sounds. There is no fluid wave. There is no palpable liver or spleen tip. Back exam shows no tenderness over the spine, ribs or hips. Extremities shows no clubbing, cyanosis or edema. Neurological exam shows no focal neurological deficits. Skin exam no rashes, ecchymosis or petechia.  Lab Results  Component Value Date   WBC 4.6 04/15/2015   HGB 13.1 04/15/2015   HCT 37.3 04/15/2015   MCV 101 04/15/2015   PLT 165 04/15/2015     Chemistry      Component Value Date/Time   NA 142 03/04/2015 1352   NA 143 12/23/2014 1239   NA 141 04/04/2014 0852   K 3.8 03/04/2015 1352   K 4.3 12/23/2014 1239   K 4.2 04/04/2014 0852   CL 108 03/04/2015 1352   CL 110 12/23/2014 1239   CL 111* 03/29/2013 1351   CO2 29 03/04/2015 1352   CO2 22 12/23/2014 1239   CO2 22 04/04/2014 0852   BUN 12 03/04/2015 1352   BUN 20 12/23/2014 1239   BUN 20.9 04/04/2014 0852   CREATININE 0.9 03/04/2015 1352   CREATININE 1.01 12/23/2014 1239   CREATININE 1.2* 04/04/2014 0852      Component Value Date/Time   CALCIUM 9.2 03/04/2015 1352   CALCIUM 9.2 12/23/2014 1239   CALCIUM 9.4 04/04/2014 0852   ALKPHOS 79 03/04/2015 1352   ALKPHOS 89 12/23/2014 1239   ALKPHOS 94 04/04/2014 0852   AST 25 03/04/2015 1352   AST 19 12/23/2014 1239   AST 20 04/04/2014 0852   ALT 19 03/04/2015 1352   ALT 17 12/23/2014 1239   ALT 17 04/04/2014 0852   BILITOT 0.80 03/04/2015 1352   BILITOT 0.3 12/23/2014 1239   BILITOT 0.23 04/04/2014 0852           Impression and Plan: Ms. Nath is 49 year old African [place] female. She has IgG kappa myeloma. She ultimately underwent a stem cell transplant. This was back on May 23, 2014. This was done at [facility].  Again, I think that Ninlaro would be a very good idea for her. I think she would not have problems with this. I think that he would be better cost wise for her and definitely be much more amenable to her busy schedule.  I will plan to  get her back to see [place] in another 3 weeks just to make sure that she is doing okay with Ninlaro.    Volanda Napoleon, MD 7/6/20162:19 PM

## 2015-04-15 NOTE — Patient Instructions (Signed)

## 2015-04-17 LAB — KAPPA/LAMBDA LIGHT CHAINS
KAPPA FREE LGHT CHN: 24 mg/dL — AB (ref 0.33–1.94)
KAPPA LAMBDA RATIO: 8.54 — AB (ref 0.26–1.65)
LAMBDA FREE LGHT CHN: 2.81 mg/dL — AB (ref 0.57–2.63)

## 2015-04-17 LAB — IGG, IGA, IGM
IgA: 218 mg/dL (ref 69–380)
IgG (Immunoglobin G), Serum: 1890 mg/dL — ABNORMAL HIGH (ref 690–1700)
IgM, Serum: 85 mg/dL (ref 52–322)

## 2015-04-17 LAB — PROTEIN ELECTROPHORESIS, SERUM, WITH REFLEX
ABNORMAL PROTEIN BAND2: 0.4 g/dL
ALPHA-1-GLOBULIN: 0.3 g/dL (ref 0.2–0.3)
Abnormal Protein Band1: 0.3 g/dL
Abnormal Protein Band3: NOT DETECTED g/dL
Albumin ELP: 4 g/dL (ref 3.8–4.8)
Alpha-2-Globulin: 0.8 g/dL (ref 0.5–0.9)
BETA 2: 0.3 g/dL (ref 0.2–0.5)
Beta Globulin: 0.4 g/dL (ref 0.4–0.6)
Gamma Globulin: 1.5 g/dL (ref 0.8–1.7)
Total Protein, Serum Electrophoresis: 7.3 g/dL (ref 6.1–8.1)

## 2015-04-17 LAB — IFE INTERPRETATION

## 2015-04-20 ENCOUNTER — Telehealth: Payer: Self-pay | Admitting: Nurse Practitioner

## 2015-04-20 NOTE — Telephone Encounter (Signed)
Faxed received from [employer] was approved 7/8 - 04/18/16 approval #[id number].

## 2015-04-27 ENCOUNTER — Telehealth: Payer: Self-pay | Admitting: Hematology & Oncology

## 2015-04-27 NOTE — Telephone Encounter (Signed)
[employer] has APPROVED the [facility]  and is good until 04/18/2016.    [id number]       COPY SCANNED

## 2015-05-05 ENCOUNTER — Telehealth: Payer: Self-pay | Admitting: *Deleted

## 2015-05-05 NOTE — Telephone Encounter (Signed)
Patient wants to know when she should start her Ninlaro. She expects delivery today. Spoke to Dr Marin Olp who wants patient to start when she receives the medication. Patient is aware and will start medication upon delivery.

## 2015-05-06 ENCOUNTER — Ambulatory Visit (HOSPITAL_BASED_OUTPATIENT_CLINIC_OR_DEPARTMENT_OTHER): Payer: Commercial Managed Care - PPO

## 2015-05-06 ENCOUNTER — Ambulatory Visit (HOSPITAL_BASED_OUTPATIENT_CLINIC_OR_DEPARTMENT_OTHER): Payer: Commercial Managed Care - PPO | Admitting: Hematology & Oncology

## 2015-05-06 ENCOUNTER — Other Ambulatory Visit: Payer: Self-pay | Admitting: Hematology & Oncology

## 2015-05-06 VITALS — Wt 168.6 lb

## 2015-05-06 DIAGNOSIS — C9000 Multiple myeloma not having achieved remission: Secondary | ICD-10-CM

## 2015-05-06 DIAGNOSIS — C9002 Multiple myeloma in relapse: Secondary | ICD-10-CM | POA: Diagnosis not present

## 2015-05-06 LAB — CBC WITH DIFFERENTIAL (CANCER CENTER ONLY)
BASO#: 0 10*3/uL (ref 0.0–0.2)
BASO%: 0.2 % (ref 0.0–2.0)
EOS%: 0.7 % (ref 0.0–7.0)
Eosinophils Absolute: 0 10*3/uL (ref 0.0–0.5)
HCT: 37.4 % (ref 34.8–46.6)
HEMOGLOBIN: 12.5 g/dL (ref 11.6–15.9)
LYMPH#: 2.3 10*3/uL (ref 0.9–3.3)
LYMPH%: 54.7 % — ABNORMAL HIGH (ref 14.0–48.0)
MCH: 34.5 pg — ABNORMAL HIGH (ref 26.0–34.0)
MCHC: 33.4 g/dL (ref 32.0–36.0)
MCV: 103 fL — ABNORMAL HIGH (ref 81–101)
MONO#: 0.5 10*3/uL (ref 0.1–0.9)
MONO%: 12.9 % (ref 0.0–13.0)
NEUT#: 1.3 10*3/uL — ABNORMAL LOW (ref 1.5–6.5)
NEUT%: 31.5 % — ABNORMAL LOW (ref 39.6–80.0)
PLATELETS: 144 10*3/uL — AB (ref 145–400)
RBC: 3.62 10*6/uL — AB (ref 3.70–5.32)
RDW: 13.4 % (ref 11.1–15.7)
WBC: 4.2 10*3/uL (ref 3.9–10.0)

## 2015-05-06 LAB — CMP (CANCER CENTER ONLY)
ALBUMIN: 3.4 g/dL (ref 3.3–5.5)
ALT(SGPT): 23 U/L (ref 10–47)
AST: 32 U/L (ref 11–38)
Alkaline Phosphatase: 90 U/L — ABNORMAL HIGH (ref 26–84)
BUN: 11 mg/dL (ref 7–22)
CHLORIDE: 107 meq/L (ref 98–108)
CO2: 23 mEq/L (ref 18–33)
Calcium: 9 mg/dL (ref 8.0–10.3)
Creat: 0.9 mg/dl (ref 0.6–1.2)
GLUCOSE: 99 mg/dL (ref 73–118)
Potassium: 4.3 mEq/L (ref 3.3–4.7)
SODIUM: 146 meq/L — AB (ref 128–145)
Total Bilirubin: 0.7 mg/dl (ref 0.20–1.60)
Total Protein: 7.2 g/dL (ref 6.4–8.1)

## 2015-05-06 MED ORDER — SODIUM CHLORIDE 0.9 % IJ SOLN
10.0000 mL | INTRAMUSCULAR | Status: DC | PRN
  Administered 2015-05-06: 10 mL via INTRAVENOUS
  Filled 2015-05-06: qty 10

## 2015-05-06 MED ORDER — DEXAMETHASONE 4 MG PO TABS: ORAL_TABLET | ORAL | Status: DC

## 2015-05-06 MED ORDER — IXAZOMIB CITRATE 4 MG PO CAPS: ORAL_CAPSULE | ORAL | Status: DC

## 2015-05-06 MED ORDER — HEPARIN SOD (PORK) LOCK FLUSH 100 UNIT/ML IV SOLN
500.0000 [IU] | Freq: Once | INTRAVENOUS | Status: AC
  Administered 2015-05-06: 500 [IU] via INTRAVENOUS
  Filled 2015-05-06: qty 5

## 2015-05-06 NOTE — Patient Instructions (Signed)

## 2015-05-07 ENCOUNTER — Other Ambulatory Visit: Payer: Self-pay | Admitting: *Deleted

## 2015-05-07 DIAGNOSIS — C9000 Multiple myeloma not having achieved remission: Secondary | ICD-10-CM

## 2015-05-07 MED ORDER — VALACYCLOVIR HCL 500 MG PO TABS: 500.0000 mg | ORAL_TABLET | Freq: Every day | ORAL | Status: DC

## 2015-05-08 LAB — PROTEIN ELECTROPHORESIS, SERUM, WITH REFLEX
ALPHA-2-GLOBULIN: 0.8 g/dL (ref 0.5–0.9)
Abnormal Protein Band1: 0.4 g/dL
Abnormal Protein Band2: 0.4 g/dL
Abnormal Protein Band3: NOT DETECTED g/dL
Albumin ELP: 3.6 g/dL — ABNORMAL LOW (ref 3.8–4.8)
Alpha-1-Globulin: 0.2 g/dL (ref 0.2–0.3)
Beta 2: 0.4 g/dL (ref 0.2–0.5)
Beta Globulin: 0.4 g/dL (ref 0.4–0.6)
Gamma Globulin: 1.5 g/dL (ref 0.8–1.7)
TOTAL PROTEIN, SERUM ELECTROPHOR: 6.8 g/dL (ref 6.1–8.1)

## 2015-05-08 LAB — IGG, IGA, IGM
IGA: 249 mg/dL (ref 69–380)
IGG (IMMUNOGLOBIN G), SERUM: 1710 mg/dL — AB (ref 690–1700)
IGM, SERUM: 83 mg/dL (ref 52–322)

## 2015-05-08 LAB — IFE INTERPRETATION

## 2015-05-08 NOTE — Progress Notes (Signed)
Hematology and Oncology Follow Up Visit  Kenadee Gates [id number] 07-Nov-1965 49 y.o. 05/08/2015   Principle Diagnosis:   IgG kappa myeloma - relapsed   HIV-positive  Current Therapy:    S/p BMT - Autologous at [facility] in 05/2014  Ninlaro -every 3 week maintenance dosing     Interim History:  Ms.  Nicholes is back for followup. We now have her on Ninlaro. This is easier for her. This is just  more convenient.  She's taken one dose so far. She had had a little bit of nausea with it. I told her that this should only be temporary. I really think that her body will be able to accommodate the Ninlaro.  She is working. She's doing well at work. She is staying busy.  She's had no diarrhea or constipation. She's had no leg swelling. She had no rashes.  There has been no fever..  When we last checked her myeloma studies, her M spike was 0.3 g/dL. Her IgG level was 1890 mg/dL. Her Light chain was 24 mg/dL.  Currently, her performance status is ECOG 1.  Medications:  Current outpatient prescriptions:  .  Abacavir-Dolutegravir-Lamivud 600-50-300 MG TABS, Take 1 tablet by mouth every morning., Disp: 30 tablet, Rfl: 5 .  calcium carbonate (OS-CAL) 600 MG TABS tablet, Take 600 mg by mouth daily with breakfast., Disp: , Rfl:  .  cholecalciferol (VITAMIN D) 1000 UNITS tablet, Take 1 tablet (1,000 Units total) by mouth daily., Disp: 30 tablet, Rfl: 3 .  docusate sodium (COLACE) 100 MG capsule, Take 100 mg by mouth 2 (two) times daily., Disp: , Rfl:  .  ixazomib citrate (NINLARO) 4 MG capsule, Take one pill on an empty stomach once a week for 3 weeks, Disp: 3 capsule, Rfl: 3 .  lidocaine-prilocaine (EMLA) cream, Apply topically as needed. Apply to portacath one hour before chemo access or blood draw., Disp: 30 g, Rfl: 3 .  ondansetron (ZOFRAN) 8 MG tablet, Take 8 mg by mouth every 8 (eight) hours as needed. , Disp: , Rfl:  .  polyethylene glycol (MIRALAX / GLYCOLAX) packet, Take 17 g by mouth as  needed. , Disp: , Rfl:  .  TraMADol HCl 50 MG TBDP, Take by mouth every 6 (six) hours as needed., Disp: , Rfl:  .  dexamethasone (DECADRON) 4 MG tablet, Take 4 pills once a week. Take with the Ninlaro., Disp: 100 tablet, Rfl: 2 .  valACYclovir (VALTREX) 500 MG tablet, Take 1 tablet (500 mg total) by mouth daily., Disp: 30 tablet, Rfl: 4 No current facility-administered medications for this visit.  Facility-Administered Medications Ordered in Other Visits:  .  0.9 %  sodium chloride infusion, , Intravenous, Once, Chauncey Cruel, MD .  ondansetron [facility]) tablet 8 mg, 8 mg, Oral, Once, Chauncey Cruel, MD, 8 mg at 12/09/14 1401  Allergies:  Allergies  Allergen Reactions  . Aspirin     Upset stomach   . Efavirenz Hives and Rash    (brand name sustiva)    Past Medical History, Surgical history, Social history, and Family History were reviewed and updated.  Review of Systems: As above  Physical Exam:  weight is 168 lb 9.6 oz (76.476 kg).   [name]. Head and exam shows no ocular or oral lesions. There are no palpable cervical or supraclavicular lymph nodes. Lungs are clear. Cardiac exam regular rate and rhythm with no murmurs rubs or bruits. Abdomen is soft. She has good bowel sounds. There is  no fluid wave. There is no palpable liver or spleen tip. Back exam shows no tenderness over the spine, ribs or hips. Extremities shows no clubbing, cyanosis or edema. Neurological exam shows no focal neurological deficits. Skin exam no rashes, ecchymosis or petechia.  Lab Results  Component Value Date   WBC 4.2 05/06/2015   HGB 12.5 05/06/2015   HCT 37.4 05/06/2015   MCV 103* 05/06/2015   PLT 144* 05/06/2015     Chemistry      Component Value Date/Time   NA 146* 05/06/2015 1311   NA 143 12/23/2014 1239   NA 141 04/04/2014 0852   K 4.3 05/06/2015 1311   K 4.3 12/23/2014 1239   K 4.2 04/04/2014 0852   CL 107 05/06/2015 1311   CL 110 12/23/2014  1239   CL 111* 03/29/2013 1351   CO2 23 05/06/2015 1311   CO2 22 12/23/2014 1239   CO2 22 04/04/2014 0852   BUN 11 05/06/2015 1311   BUN 20 12/23/2014 1239   BUN 20.9 04/04/2014 0852   CREATININE 0.9 05/06/2015 1311   CREATININE 1.01 12/23/2014 1239   CREATININE 1.2* 04/04/2014 0852      Component Value Date/Time   CALCIUM 9.0 05/06/2015 1311   CALCIUM 9.2 12/23/2014 1239   CALCIUM 9.4 04/04/2014 0852   ALKPHOS 90* 05/06/2015 1311   ALKPHOS 89 12/23/2014 1239   ALKPHOS 94 04/04/2014 0852   AST 32 05/06/2015 1311   AST 19 12/23/2014 1239   AST 20 04/04/2014 0852   ALT 23 05/06/2015 1311   ALT 17 12/23/2014 1239   ALT 17 04/04/2014 0852   BILITOT 0.70 05/06/2015 1311   BILITOT 0.3 12/23/2014 1239   BILITOT 0.23 04/04/2014 0852         Impression and Plan: Ms. Cyphers is 49 year old African [place] female. She has IgG kappa myeloma. She ultimately underwent a stem cell transplant. This was back on May 23, 2014. This was done at [facility].  Again, I think that Kennieth Rad is a good idea. She really hates getting the subcutaneous injections.  From my point of view, she is asymptomatic. I think we just had to be patient and see how things go with the Ninlaro. We can always add Pomalidomide if necessary.  We will go ahead and plan to get her back in another month   Volanda Napoleon, MD 7/29/20163:20 PM

## 2015-05-27 ENCOUNTER — Encounter: Payer: Self-pay | Admitting: Hematology & Oncology

## 2015-05-27 ENCOUNTER — Ambulatory Visit (HOSPITAL_BASED_OUTPATIENT_CLINIC_OR_DEPARTMENT_OTHER): Payer: Commercial Managed Care - PPO | Admitting: Hematology & Oncology

## 2015-05-27 ENCOUNTER — Ambulatory Visit (HOSPITAL_BASED_OUTPATIENT_CLINIC_OR_DEPARTMENT_OTHER): Payer: Commercial Managed Care - PPO

## 2015-05-27 ENCOUNTER — Ambulatory Visit: Payer: Commercial Managed Care - PPO

## 2015-05-27 VITALS — BP 138/74 | HR 70 | Temp 97.9°F | Resp 18 | Ht 64.0 in | Wt 171.0 lb

## 2015-05-27 DIAGNOSIS — C9000 Multiple myeloma not having achieved remission: Secondary | ICD-10-CM

## 2015-05-27 DIAGNOSIS — B20 Human immunodeficiency virus [HIV] disease: Secondary | ICD-10-CM | POA: Diagnosis not present

## 2015-05-27 DIAGNOSIS — Z9484 Stem cells transplant status: Secondary | ICD-10-CM

## 2015-05-27 DIAGNOSIS — Z452 Encounter for adjustment and management of vascular access device: Secondary | ICD-10-CM

## 2015-05-27 DIAGNOSIS — C9002 Multiple myeloma in relapse: Secondary | ICD-10-CM

## 2015-05-27 LAB — CMP (CANCER CENTER ONLY)
ALT(SGPT): 19 U/L (ref 10–47)
AST: 29 U/L (ref 11–38)
Albumin: 3.2 g/dL — ABNORMAL LOW (ref 3.3–5.5)
Alkaline Phosphatase: 94 U/L — ABNORMAL HIGH (ref 26–84)
BILIRUBIN TOTAL: 0.5 mg/dL (ref 0.20–1.60)
BUN, Bld: 11 mg/dL (ref 7–22)
CALCIUM: 9 mg/dL (ref 8.0–10.3)
CHLORIDE: 104 meq/L (ref 98–108)
CO2: 23 meq/L (ref 18–33)
Creat: 1 mg/dl (ref 0.6–1.2)
GLUCOSE: 102 mg/dL (ref 73–118)
Potassium: 3.9 mEq/L (ref 3.3–4.7)
SODIUM: 137 meq/L (ref 128–145)
Total Protein: 7 g/dL (ref 6.4–8.1)

## 2015-05-27 LAB — CBC WITH DIFFERENTIAL (CANCER CENTER ONLY)
BASO#: 0 10*3/uL (ref 0.0–0.2)
BASO%: 0.5 % (ref 0.0–2.0)
EOS ABS: 0.1 10*3/uL (ref 0.0–0.5)
EOS%: 1.8 % (ref 0.0–7.0)
HEMATOCRIT: 37.1 % (ref 34.8–46.6)
HEMOGLOBIN: 12.5 g/dL (ref 11.6–15.9)
LYMPH#: 2.2 10*3/uL (ref 0.9–3.3)
LYMPH%: 49.3 % — ABNORMAL HIGH (ref 14.0–48.0)
MCH: 34.3 pg — AB (ref 26.0–34.0)
MCHC: 33.7 g/dL (ref 32.0–36.0)
MCV: 102 fL — ABNORMAL HIGH (ref 81–101)
MONO#: 0.6 10*3/uL (ref 0.1–0.9)
MONO%: 12.6 % (ref 0.0–13.0)
NEUT%: 35.8 % — ABNORMAL LOW (ref 39.6–80.0)
NEUTROS ABS: 1.6 10*3/uL (ref 1.5–6.5)
Platelets: 137 10*3/uL — ABNORMAL LOW (ref 145–400)
RBC: 3.64 10*6/uL — AB (ref 3.70–5.32)
RDW: 12.8 % (ref 11.1–15.7)
WBC: 4.4 10*3/uL (ref 3.9–10.0)

## 2015-05-27 MED ORDER — ALTEPLASE 2 MG IJ SOLR
INTRAMUSCULAR | Status: AC
  Filled 2015-05-27: qty 2

## 2015-05-27 MED ORDER — HEPARIN SOD (PORK) LOCK FLUSH 100 UNIT/ML IV SOLN
500.0000 [IU] | Freq: Once | INTRAVENOUS | Status: AC
  Administered 2015-05-27: 500 [IU] via INTRAVENOUS
  Filled 2015-05-27: qty 5

## 2015-05-27 MED ORDER — ALTEPLASE 2 MG IJ SOLR
2.0000 mg | Freq: Once | INTRAMUSCULAR | Status: AC
  Administered 2015-05-27: 2 mg
  Filled 2015-05-27: qty 2

## 2015-05-27 MED ORDER — SODIUM CHLORIDE 0.9 % IJ SOLN
10.0000 mL | INTRAMUSCULAR | Status: DC | PRN
  Administered 2015-05-27: 10 mL via INTRAVENOUS
  Filled 2015-05-27: qty 10

## 2015-05-27 NOTE — Patient Instructions (Signed)

## 2015-05-27 NOTE — Progress Notes (Signed)
Hematology and Oncology Follow Up Visit  Jennifer Boyle [id number] Jan 08, 1966 49 y.o. 05/27/2015   Principle Diagnosis:   IgG kappa myeloma - relapsed   HIV-positive  Current Therapy:    S/p BMT - Autologous at [facility] in 05/2014  Ninlaro -every 3 week maintenance dosing     Interim History:  Jennifer Boyle is back for followup. We now have her on Ninlaro. I'm not sure if she is having any problems with the Ninlaro. She said that she started having a lot of bone pain after the second dose. I think this would be considered a unusual side effect of Ninlaro. She's had no joint swelling. She's had no joint erythema. She says that the bone pain seems to be in her long bones.  She's had no fever. She's had no bleeding.  I told her to try some Claritin if she starts having the bone pain again.  She is on her week off. She will restart next week.  We last saw her in July, her IgG level was 1710 mg/dL. She had a total of 0.8 g/dL of myeloma protein. She was found to have 2 monoclonal bands. Each measured 0.4 g/dL.  She's had no leg swelling. She's had no rashes. She's had no bleeding. She's had no nausea or vomiting.  Currently, her performance status is ECOG 1.  Medications:  Current outpatient prescriptions:  .  Abacavir-Dolutegravir-Lamivud 600-50-300 MG TABS, Take 1 tablet by mouth every morning., Disp: 30 tablet, Rfl: 5 .  calcium carbonate (OS-CAL) 600 MG TABS tablet, Take 600 mg by mouth daily with breakfast., Disp: , Rfl:  .  cholecalciferol (VITAMIN D) 1000 UNITS tablet, Take 1 tablet (1,000 Units total) by mouth daily., Disp: 30 tablet, Rfl: 3 .  dexamethasone (DECADRON) 4 MG tablet, Take 4 pills once a week. Take with the Ninlaro., Disp: 100 tablet, Rfl: 2 .  docusate sodium (COLACE) 100 MG capsule, Take 100 mg by mouth 2 (two) times daily., Disp: , Rfl:  .  ixazomib citrate (NINLARO) 4 MG capsule, Take one pill on an empty stomach once a week for 3 weeks, Disp: 3 capsule, Rfl:  3 .  lidocaine-prilocaine (EMLA) cream, Apply topically as needed. Apply to portacath one hour before chemo access or blood draw., Disp: 30 g, Rfl: 3 .  ondansetron (ZOFRAN) 8 MG tablet, Take 8 mg by mouth every 8 (eight) hours as needed. , Disp: , Rfl:  .  polyethylene glycol (MIRALAX / GLYCOLAX) packet, Take 17 g by mouth as needed. , Disp: , Rfl:  .  valACYclovir (VALTREX) 500 MG tablet, Take 1 tablet (500 mg total) by mouth daily., Disp: 30 tablet, Rfl: 4 .  TraMADol HCl 50 MG TBDP, Take by mouth every 6 (six) hours as needed., Disp: , Rfl:  No current facility-administered medications for this visit.  Facility-Administered Medications Ordered in Other Visits:  .  0.9 %  sodium chloride infusion, , Intravenous, Once, Chauncey Cruel, MD .  ondansetron [facility]) tablet 8 mg, 8 mg, Oral, Once, Chauncey Cruel, MD, 8 mg at 12/09/14 1401 .  sodium chloride 0.9 % injection 10 mL, 10 mL, Intravenous, PRN, Volanda Napoleon, MD, 10 mL at 05/27/15 1502  Allergies:  Allergies  Allergen Reactions  . Aspirin     Upset stomach   . Efavirenz Hives and Rash    (brand name sustiva)    Past Medical History, Surgical history, Social history, and Family History were reviewed and updated.  Review of Systems:  As above  Physical Exam:  height is 5\' 4"  (1.626 m) and weight is 171 lb (77.565 kg). Her oral temperature is 97.9 F (36.6 C). Her blood pressure is 138/74 and her pulse is 70. Her respiration is 18.   [name]. Head and exam shows no ocular or oral lesions. There are no palpable cervical or supraclavicular lymph nodes. Lungs are clear. Cardiac exam regular rate and rhythm with no murmurs rubs or bruits. Abdomen is soft. She has good bowel sounds. There is no fluid wave. There is no palpable liver or spleen tip. Back exam shows no tenderness over the spine, ribs or hips. Extremities shows no clubbing, cyanosis or edema. Neurological exam shows no focal  neurological deficits. Skin exam no rashes, ecchymosis or petechia.  Lab Results  Component Value Date   WBC 4.4 05/27/2015   HGB 12.5 05/27/2015   HCT 37.1 05/27/2015   MCV 102* 05/27/2015   PLT 137* 05/27/2015     Chemistry      Component Value Date/Time   NA 137 05/27/2015 1343   NA 143 12/23/2014 1239   NA 141 04/04/2014 0852   K 3.9 05/27/2015 1343   K 4.3 12/23/2014 1239   K 4.2 04/04/2014 0852   CL 104 05/27/2015 1343   CL 110 12/23/2014 1239   CL 111* 03/29/2013 1351   CO2 23 05/27/2015 1343   CO2 22 12/23/2014 1239   CO2 22 04/04/2014 0852   BUN 11 05/27/2015 1343   BUN 20 12/23/2014 1239   BUN 20.9 04/04/2014 0852   CREATININE 1.0 05/27/2015 1343   CREATININE 1.01 12/23/2014 1239   CREATININE 1.2* 04/04/2014 0852      Component Value Date/Time   CALCIUM 9.0 05/27/2015 1343   CALCIUM 9.2 12/23/2014 1239   CALCIUM 9.4 04/04/2014 0852   ALKPHOS 94* 05/27/2015 1343   ALKPHOS 89 12/23/2014 1239   ALKPHOS 94 04/04/2014 0852   AST 29 05/27/2015 1343   AST 19 12/23/2014 1239   AST 20 04/04/2014 0852   ALT 19 05/27/2015 1343   ALT 17 12/23/2014 1239   ALT 17 04/04/2014 0852   BILITOT 0.50 05/27/2015 1343   BILITOT 0.3 12/23/2014 1239   BILITOT 0.23 04/04/2014 0852         Impression and Plan: [name] is 49 year old African [place] female. She has IgG kappa myeloma. She ultimately underwent a stem cell transplant. This was back on May 23, 2014. This was done at [facility].  Again, I think that Jennifer Boyle is a good idea. Hopefully, the bone pain will not recur. She will start her next cycle of Ninlaro next week. Again, I told her to try some Claritin.  I want to see her back myself in one month. I spent about 30 minutes with her talking to her about this bone pain which may or may not be from the Ninlaro.  Volanda Napoleon, MD 8/17/20165:00 PM

## 2015-06-01 ENCOUNTER — Other Ambulatory Visit: Payer: Self-pay | Admitting: Hematology & Oncology

## 2015-06-01 LAB — PROTEIN ELECTROPHORESIS, SERUM, WITH REFLEX
ABNORMAL PROTEIN BAND1: 0.4 g/dL
ALPHA-1-GLOBULIN: 0.3 g/dL (ref 0.2–0.3)
ALPHA-2-GLOBULIN: 0.7 g/dL (ref 0.5–0.9)
Abnormal Protein Band2: 0.3 g/dL
Abnormal Protein Band3: NOT DETECTED g/dL
Albumin ELP: 3.5 g/dL — ABNORMAL LOW (ref 3.8–4.8)
BETA 2: 0.3 g/dL (ref 0.2–0.5)
Beta Globulin: 0.7 g/dL — ABNORMAL HIGH (ref 0.4–0.6)
GAMMA GLOBULIN: 1.3 g/dL (ref 0.8–1.7)
Total Protein, Serum Electrophoresis: 6.8 g/dL (ref 6.1–8.1)

## 2015-06-01 LAB — IGG, IGA, IGM
IgA: 198 mg/dL (ref 69–380)
IgG (Immunoglobin G), Serum: 1210 mg/dL (ref 690–1700)
IgM, Serum: 53 mg/dL (ref 52–322)

## 2015-06-01 LAB — KAPPA/LAMBDA LIGHT CHAINS
KAPPA FREE LGHT CHN: 69.4 mg/dL — AB (ref 0.33–1.94)
KAPPA LAMBDA RATIO: 21.42 — AB (ref 0.26–1.65)
LAMBDA FREE LGHT CHN: 3.24 mg/dL — AB (ref 0.57–2.63)

## 2015-06-01 LAB — IFE INTERPRETATION

## 2015-06-17 ENCOUNTER — Ambulatory Visit: Payer: Self-pay

## 2015-06-17 ENCOUNTER — Other Ambulatory Visit: Payer: Self-pay

## 2015-07-08 ENCOUNTER — Ambulatory Visit: Payer: Self-pay | Admitting: Hematology & Oncology

## 2015-07-08 ENCOUNTER — Other Ambulatory Visit: Payer: Self-pay

## 2015-07-13 ENCOUNTER — Ambulatory Visit (HOSPITAL_BASED_OUTPATIENT_CLINIC_OR_DEPARTMENT_OTHER): Payer: Commercial Managed Care - PPO

## 2015-07-13 ENCOUNTER — Ambulatory Visit (HOSPITAL_BASED_OUTPATIENT_CLINIC_OR_DEPARTMENT_OTHER): Payer: Commercial Managed Care - PPO | Admitting: Hematology & Oncology

## 2015-07-13 ENCOUNTER — Encounter: Payer: Self-pay | Admitting: Hematology & Oncology

## 2015-07-13 VITALS — BP 132/81 | HR 90 | Temp 97.5°F | Resp 16 | Ht 64.0 in | Wt 171.0 lb

## 2015-07-13 DIAGNOSIS — Z9484 Stem cells transplant status: Secondary | ICD-10-CM | POA: Diagnosis not present

## 2015-07-13 DIAGNOSIS — C9002 Multiple myeloma in relapse: Secondary | ICD-10-CM

## 2015-07-13 DIAGNOSIS — B20 Human immunodeficiency virus [HIV] disease: Secondary | ICD-10-CM | POA: Diagnosis not present

## 2015-07-13 DIAGNOSIS — C9000 Multiple myeloma not having achieved remission: Secondary | ICD-10-CM

## 2015-07-13 LAB — CBC WITH DIFFERENTIAL (CANCER CENTER ONLY)
BASO#: 0 10*3/uL (ref 0.0–0.2)
BASO%: 0.2 % (ref 0.0–2.0)
EOS%: 1.8 % (ref 0.0–7.0)
Eosinophils Absolute: 0.1 10*3/uL (ref 0.0–0.5)
HCT: 35.3 % (ref 34.8–46.6)
HGB: 11.7 g/dL (ref 11.6–15.9)
LYMPH#: 2.2 10*3/uL (ref 0.9–3.3)
LYMPH%: 50.8 % — AB (ref 14.0–48.0)
MCH: 33.1 pg (ref 26.0–34.0)
MCHC: 33.1 g/dL (ref 32.0–36.0)
MCV: 100 fL (ref 81–101)
MONO#: 0.6 10*3/uL (ref 0.1–0.9)
MONO%: 12.9 % (ref 0.0–13.0)
NEUT#: 1.5 10*3/uL (ref 1.5–6.5)
NEUT%: 34.3 % — AB (ref 39.6–80.0)
PLATELETS: 114 10*3/uL — AB (ref 145–400)
RBC: 3.54 10*6/uL — AB (ref 3.70–5.32)
RDW: 13.8 % (ref 11.1–15.7)
WBC: 4.4 10*3/uL (ref 3.9–10.0)

## 2015-07-13 LAB — CMP (CANCER CENTER ONLY)
ALBUMIN: 3.1 g/dL — AB (ref 3.3–5.5)
ALK PHOS: 84 U/L (ref 26–84)
ALT: 27 U/L (ref 10–47)
AST: 38 U/L (ref 11–38)
BILIRUBIN TOTAL: 0.6 mg/dL (ref 0.20–1.60)
BUN, Bld: 11 mg/dL (ref 7–22)
CALCIUM: 9.7 mg/dL (ref 8.0–10.3)
CO2: 24 mEq/L (ref 18–33)
Chloride: 111 mEq/L — ABNORMAL HIGH (ref 98–108)
Creat: 0.6 mg/dl (ref 0.6–1.2)
Glucose, Bld: 131 mg/dL — ABNORMAL HIGH (ref 73–118)
Potassium: 3.7 mEq/L (ref 3.3–4.7)
Sodium: 141 mEq/L (ref 128–145)
TOTAL PROTEIN: 7 g/dL (ref 6.4–8.1)

## 2015-07-13 LAB — TECHNOLOGIST REVIEW CHCC SATELLITE

## 2015-07-13 MED ORDER — SODIUM CHLORIDE 0.9 % IJ SOLN
10.0000 mL | INTRAMUSCULAR | Status: DC | PRN
  Administered 2015-07-13: 10 mL via INTRAVENOUS
  Filled 2015-07-13: qty 10

## 2015-07-13 MED ORDER — HEPARIN SOD (PORK) LOCK FLUSH 100 UNIT/ML IV SOLN
500.0000 [IU] | Freq: Once | INTRAVENOUS | Status: AC
  Administered 2015-07-13: 500 [IU] via INTRAVENOUS
  Filled 2015-07-13: qty 5

## 2015-07-13 MED ORDER — POMALIDOMIDE 4 MG PO CAPS: 4.0000 mg | ORAL_CAPSULE | Freq: Every day | ORAL | Status: DC

## 2015-07-13 NOTE — Patient Instructions (Signed)

## 2015-07-13 NOTE — Progress Notes (Signed)
[name] presented for Portacath access and flush. Proper placement of portacath confirmed by CXR. Portacath located in the right chest wall accessed with  H 20 needle. Clean, Dry and Intact Good blood return present. Portacath flushed with 16ml NS and 500U/84ml Heparin per protocol and needle removed intact. Procedure without incident. Patient tolerated procedure well.

## 2015-07-13 NOTE — Progress Notes (Signed)
Hematology and Oncology Follow Up Visit  [place] Stell [id number] 24-Jul-1966 49 y.o. 07/13/2015   Principle Diagnosis:   IgG kappa myeloma - relapsed   HIV-positive  Current Therapy:    S/p BMT - Autologous at [facility] in 05/2014  Kyprolis-every week dosing-start next week  Pomalidomide 4 mg by mouth daily (21/7) - next week     Interim History:  Ms.  Cohrs is back for followup. I think it is apparent that her myeloma is beginning to recur already. We went ahead and did her myeloma studies back in August. Her light chain is going up. In July her light chain was 24 mg/dL. In August it was 69.4 mg/dL. Her M spike showed 2 monoclonal proteins. These measured a total of 0.7 g/dL. This is holding pretty steady.  Her HIV really has not been a problem.  She is still working at [employer]. She has off this week.  She has some family come in over the weekend and they all had a good time.  She's had no rashes. She's had no headache. She's had no cough. She's had no mouth sores.   Currently, her performance status is ECOG 1.  Medications:  Current outpatient prescriptions:  .  Abacavir-Dolutegravir-Lamivud 600-50-300 MG TABS, Take 1 tablet by mouth every morning., Disp: 30 tablet, Rfl: 5 .  calcium carbonate (OS-CAL) 600 MG TABS tablet, Take 600 mg by mouth daily with breakfast., Disp: , Rfl:  .  cholecalciferol (VITAMIN D) 1000 UNITS tablet, Take 1 tablet (1,000 Units total) by mouth daily., Disp: 30 tablet, Rfl: 3 .  dexamethasone (DECADRON) 4 MG tablet, Take 4 pills once a week. Take with the Ninlaro., Disp: 100 tablet, Rfl: 2 .  docusate sodium (COLACE) 100 MG capsule, Take 100 mg by mouth 2 (two) times daily., Disp: , Rfl:  .  lidocaine-prilocaine (EMLA) cream, Apply topically as needed. Apply to portacath one hour before chemo access or blood draw., Disp: 30 g, Rfl: 3 .  ondansetron (ZOFRAN) 8 MG tablet, Take 8 mg by mouth every 8 (eight) hours as needed. , Disp: , Rfl:    .  polyethylene glycol (MIRALAX / GLYCOLAX) packet, Take 17 g by mouth as needed. , Disp: , Rfl:  .  TraMADol HCl 50 MG TBDP, Take by mouth every 6 (six) hours as needed., Disp: , Rfl:  .  valACYclovir (VALTREX) 500 MG tablet, TAKE 1 TABLET BY MOUTH EVERY DAY, Disp: 30 tablet, Rfl: 4 .  pomalidomide (POMALYST) 4 MG capsule, Take 1 capsule (4 mg total) by mouth daily. Take with water on days 1-21. Repeat every 28 days., Disp: 21 capsule, Rfl: 4 No current facility-administered medications for this visit.  Facility-Administered Medications Ordered in Other Visits:  .  sodium chloride 0.9 % injection 10 mL, 10 mL, Intravenous, PRN, Volanda Napoleon, MD, 10 mL at 07/13/15 1220  Allergies:  Allergies  Allergen Reactions  . Aspirin     Upset stomach   . Efavirenz Hives and Rash    (brand name sustiva)    Past Medical History, Surgical history, Social history, and Family History were reviewed and updated.  Review of Systems: As above  Physical Exam:  height is 5\' 4"  (1.626 m) and weight is 171 lb (77.565 kg). Her oral temperature is 97.5 F (36.4 C). Her blood pressure is 132/81 and her pulse is 90. Her respiration is 16.   [name]. Head and exam shows no ocular or oral lesions. There  are no palpable cervical or supraclavicular lymph nodes. Lungs are clear. Cardiac exam regular rate and rhythm with no murmurs rubs or bruits. Abdomen is soft. She has good bowel sounds. There is no fluid wave. There is no palpable liver or spleen tip. Back exam shows no tenderness over the spine, ribs or hips. Extremities shows no clubbing, cyanosis or edema. Neurological exam shows no focal neurological deficits. Skin exam no rashes, ecchymosis or petechia.  Lab Results  Component Value Date   WBC 4.4 07/13/2015   HGB 11.7 07/13/2015   HCT 35.3 07/13/2015   MCV 100 07/13/2015   PLT 114* 07/13/2015     Chemistry      Component Value Date/Time   NA 141 07/13/2015  1159   NA 143 12/23/2014 1239   NA 141 04/04/2014 0852   K 3.7 07/13/2015 1159   K 4.3 12/23/2014 1239   K 4.2 04/04/2014 0852   CL 111* 07/13/2015 1159   CL 110 12/23/2014 1239   CL 111* 03/29/2013 1351   CO2 24 07/13/2015 1159   CO2 22 12/23/2014 1239   CO2 22 04/04/2014 0852   BUN 11 07/13/2015 1159   BUN 20 12/23/2014 1239   BUN 20.9 04/04/2014 0852   CREATININE 0.6 07/13/2015 1159   CREATININE 1.01 12/23/2014 1239   CREATININE 1.2* 04/04/2014 0852      Component Value Date/Time   CALCIUM 9.7 07/13/2015 1159   CALCIUM 9.2 12/23/2014 1239   CALCIUM 9.4 04/04/2014 0852   ALKPHOS 84 07/13/2015 1159   ALKPHOS 89 12/23/2014 1239   ALKPHOS 94 04/04/2014 0852   AST 38 07/13/2015 1159   AST 19 12/23/2014 1239   AST 20 04/04/2014 0852   ALT 27 07/13/2015 1159   ALT 17 12/23/2014 1239   ALT 17 04/04/2014 0852   BILITOT 0.60 07/13/2015 1159   BILITOT 0.3 12/23/2014 1239   BILITOT 0.23 04/04/2014 0852         Impression and Plan: Ms. Ontko is 49 year old African [place] female. She has IgG kappa myeloma. She ultimately underwent a stem cell transplant. This was back on May 23, 2014. This was done at [facility].  Unfortunately, I do think she is progressing. Her light chain is going up.  She is having problems with the Ninlaro with respect to neuropathy.  I think we need to make a change and get her on Kyprolis with Pomalidomide. I think this would be a very good combination for her. She has not had either of these medications. I think we can certainly get her back into a better remission.  I went over the side effects of the Kyprolis and Pomalidomide. I think with Kyprolis, we can try to do this weekly. I think this would make good sense for her. Again with her working, it is hard for her to take all this time off.  I think we can get synergy with the Pomalidomide.  I spent a good 45 minutes with her. She understands why we should change in therapy. She agrees to  the change.  We will get started next week. There have I will plan to see her back in one month.        Volanda Napoleon, MD 10/3/20163:26 PM

## 2015-07-15 ENCOUNTER — Telehealth: Payer: Self-pay | Admitting: Hematology & Oncology

## 2015-07-15 LAB — IFE INTERPRETATION

## 2015-07-15 NOTE — Telephone Encounter (Signed)
[facility] has APPROVED the POMALYST and is good until 07/14/2016.    PA: [id number]       COPY SCANNED

## 2015-07-17 ENCOUNTER — Encounter: Payer: Self-pay | Admitting: Nurse Practitioner

## 2015-07-17 NOTE — Progress Notes (Signed)
Received a call from [place] at [facility] stating they have had a hard time filling patient's Pomalyst because originally her insurance restricted this medication to a [facility] restricted pharmacy. However, the [facility] pharmacy contacted them and stated they could not fil this medication order. [facility] is now trying to work with the insurance company to obtain and over-ride because they are having to pre-approve the order again and this could take up to 7-10days being the [facility] attempted to run the script. The patient is aware and [facility] stated they have also reached out to [employer] for assistance to see if they could possibly get the insurance company to expedite this process as it was delayed due to [facility]. Will call and or fax an update when available.

## 2015-07-20 ENCOUNTER — Ambulatory Visit (HOSPITAL_BASED_OUTPATIENT_CLINIC_OR_DEPARTMENT_OTHER): Payer: Commercial Managed Care - PPO | Admitting: Family

## 2015-07-20 ENCOUNTER — Ambulatory Visit (HOSPITAL_BASED_OUTPATIENT_CLINIC_OR_DEPARTMENT_OTHER)
Admission: RE | Admit: 2015-07-20 | Discharge: 2015-07-20 | Disposition: A | Discharge status: Home / Self Care | Payer: Commercial Managed Care - PPO | Source: Ambulatory Visit | Attending: Family | Admitting: Family

## 2015-07-20 ENCOUNTER — Encounter: Payer: Self-pay | Admitting: Family

## 2015-07-20 ENCOUNTER — Other Ambulatory Visit: Payer: Self-pay | Admitting: Family

## 2015-07-20 ENCOUNTER — Ambulatory Visit (HOSPITAL_BASED_OUTPATIENT_CLINIC_OR_DEPARTMENT_OTHER): Payer: Commercial Managed Care - PPO

## 2015-07-20 ENCOUNTER — Other Ambulatory Visit: Payer: Commercial Managed Care - PPO

## 2015-07-20 VITALS — BP 139/71 | HR 115 | Temp 100.9°F | Resp 22 | Ht 64.0 in | Wt 171.0 lb

## 2015-07-20 DIAGNOSIS — B20 Human immunodeficiency virus [HIV] disease: Secondary | ICD-10-CM

## 2015-07-20 DIAGNOSIS — R0602 Shortness of breath: Secondary | ICD-10-CM | POA: Insufficient documentation

## 2015-07-20 DIAGNOSIS — J189 Pneumonia, unspecified organism: Secondary | ICD-10-CM

## 2015-07-20 DIAGNOSIS — Z9484 Stem cells transplant status: Secondary | ICD-10-CM

## 2015-07-20 DIAGNOSIS — C9002 Multiple myeloma in relapse: Secondary | ICD-10-CM

## 2015-07-20 DIAGNOSIS — R6883 Chills (without fever): Secondary | ICD-10-CM | POA: Diagnosis not present

## 2015-07-20 DIAGNOSIS — R918 Other nonspecific abnormal finding of lung field: Secondary | ICD-10-CM | POA: Insufficient documentation

## 2015-07-20 DIAGNOSIS — R079 Chest pain, unspecified: Secondary | ICD-10-CM | POA: Diagnosis present

## 2015-07-20 MED ORDER — CEFDINIR 300 MG PO CAPS: 600.0000 mg | ORAL_CAPSULE | Freq: Two times a day (BID) | ORAL | Status: DC

## 2015-07-20 MED ORDER — DEXTROSE 5 % IV SOLN
2.0000 g | Freq: Once | INTRAVENOUS | Status: AC
  Administered 2015-07-20: 2 g via INTRAVENOUS
  Filled 2015-07-20: qty 2

## 2015-07-20 MED ORDER — SODIUM CHLORIDE 0.9 % IV SOLN
Freq: Once | INTRAVENOUS | Status: AC
Start: 2015-07-20 — End: 2015-07-20
  Administered 2015-07-20: 17:00:00 via INTRAVENOUS

## 2015-07-20 NOTE — Patient Instructions (Signed)
Ceftriaxone injection What is this medicine? CEFTRIAXONE (sef try AX one) is a cephalosporin antibiotic. It is used to treat certain kinds of bacterial infections. It will not work for colds, flu, or other viral infections. This medicine may be used for other purposes; ask your health care provider or pharmacist if you have questions. What should I tell my health care provider before I take this medicine? They need to know if you have any of these conditions: -any chronic illness -bowel disease, like colitis -both kidney and liver disease -high bilirubin level in newborn patients -an unusual or allergic reaction to ceftriaxone, other cephalosporin or penicillin antibiotics, foods, dyes, or preservatives -pregnant or trying to get pregnant -breast-feeding How should I use this medicine? This medicine is injected into a muscle or infused it into a vein. It is usually given in a medical office or clinic. If you are to give this medicine you will be taught how to inject it. Follow instructions carefully. Use your doses at regular intervals. Do not take your medicine more often than directed. Do not skip doses or stop your medicine early even if you feel better. Do not stop taking except on your doctor's advice. Talk to your pediatrician regarding the use of this medicine in children. Special care may be needed. Overdosage: If you think you have taken too much of this medicine contact a poison control center or emergency room at once. NOTE: This medicine is only for you. Do not share this medicine with others. What if I miss a dose? If you miss a dose, take it as soon as you can. If it is almost time for your next dose, take only that dose. Do not take double or extra doses. What may interact with this medicine? Do not take this medicine with any of the following medications: -intravenous calcium This medicine may also interact with the following medications: -birth control pills This list may  not describe all possible interactions. Give your health care provider a list of all the medicines, herbs, non-prescription drugs, or dietary supplements you use. Also tell them if you smoke, drink alcohol, or use illegal drugs. Some items may interact with your medicine. What should I watch for while using this medicine? Tell your doctor or health care professional if your symptoms do not improve or if they get worse. Do not treat diarrhea with over the counter products. Contact your doctor if you have diarrhea that lasts more than 2 days or if it is severe and watery. If you are being treated for a sexually transmitted disease, avoid sexual contact until you have finished your treatment. Having sex can infect your sexual partner. Calcium may bind to this medicine and cause lung or kidney problems. Avoid calcium products while taking this medicine and for 48 hours after taking the last dose of this medicine. What side effects may I notice from receiving this medicine? Side effects that you should report to your doctor or health care professional as soon as possible: -allergic reactions like skin rash, itching or hives, swelling of the face, lips, or tongue -breathing problems -fever, chills -irregular heartbeat -pain when passing urine -seizures -stomach pain, cramps -unusual bleeding, bruising -unusually weak or tired Side effects that usually do not require medical attention (report to your doctor or health care professional if they continue or are bothersome): -diarrhea -dizzy, drowsy -headache -nausea, vomiting -pain, swelling, irritation where injected -stomach upset -sweating This list may not describe all possible side effects. Call your doctor for   medical advice about side effects. You may report side effects to FDA at 1-800-FDA-1088. Where should I keep my medicine? Keep out of the reach of children. Store at room temperature below 25 degrees C (77 degrees F). Protect from  light. Throw away any unused vials after the expiration date. NOTE: This sheet is a summary. It may not cover all possible information. If you have questions about this medicine, talk to your doctor, pharmacist, or health care provider.    2016, Elsevier/Gold Standard. (2014-04-14 09:14:54)  

## 2015-07-20 NOTE — Progress Notes (Signed)
Hematology and Oncology Follow Up Visit  Jennifer Boyle [id number] [birth year] 49 y.o. 07/20/2015   Principle Diagnosis:  IgG kappa myeloma - relapsed HIV-positive  Current Therapy:   S/p BMT - Autologous at [facility] in 05/2014 Kyprolis-every week dosing Pomalidomide 4 mg by mouth daily (21/7)    Interim History:  Jennifer Boyle is here today with c/o cough with thick mucus, SOB, fever and feeling dehydrated since Saturday. She has some right sided tenderness as well.   Chest xray this afternoon showed right upper lobe pneumonia. She has a low grade fever at this time and is tachycardic at 115. We will give her antibiotics and fluids while she is here in our office.  She has had some dizziness which she attributes to the dehydration.  She denies n/v, rash, palpitations, abdominal pain, changes in bowel or bladder habits. She has had no episodes of bleeding.  She has some "puffiness" in her ankles. No numbness or tingling in her extremities.  She has not been eating or drinking well since Saturday. Her weight is unchanged.    Medications:    Medication List       This list is accurate as of: 07/20/15  4:44 PM.  Always use your most recent med list.               Abacavir-Dolutegravir-Lamivud 600-50-300 MG Tabs  Take 1 tablet by mouth every morning.     calcium carbonate 600 MG Tabs tablet  Commonly known as:  OS-CAL  Take 600 mg by mouth daily with breakfast.     cholecalciferol 1000 UNITS tablet  Commonly known as:  VITAMIN D  Take 1 tablet (1,000 Units total) by mouth daily.     dexamethasone 4 MG tablet  Commonly known as:  DECADRON  Take 4 pills once a week. Take with the Ninlaro.     docusate sodium 100 MG capsule  Commonly known as:  COLACE  Take 100 mg by mouth 2 (two) times daily.     lidocaine-prilocaine cream  Commonly known as:  EMLA  Apply topically as needed. Apply to portacath one hour before chemo access or blood draw.     ondansetron 8 MG tablet    Commonly known as:  ZOFRAN  Take 8 mg by mouth every 8 (eight) hours as needed.     polyethylene glycol packet  Commonly known as:  MIRALAX / GLYCOLAX  Take 17 g by mouth as needed.     pomalidomide 4 MG capsule  Commonly known as:  POMALYST  Take 1 capsule (4 mg total) by mouth daily. Take with water on days 1-21. Repeat every 28 days.     TraMADol HCl 50 MG Tbdp  Take by mouth every 6 (six) hours as needed.     valACYclovir 500 MG tablet  Commonly known as:  VALTREX  TAKE 1 TABLET BY MOUTH EVERY DAY        Allergies:  Allergies  Allergen Reactions  . Aspirin     Upset stomach   . Efavirenz Hives and Rash    (brand name sustiva)    Past Medical History, Surgical history, Social history, and Family History were reviewed and updated.  Review of Systems: All other 10 point review of systems is negative.   Physical Exam:  height is 5\' 4"  (1.626 m) and weight is 171 lb (77.565 kg). Her oral temperature is 100.9 F (38.3 C). Her blood pressure is 139/71 and her pulse is 115. Her respiration is 22.  Wt Readings from Last 3 Encounters:  07/20/15 171 lb (77.565 kg)  07/13/15 171 lb (77.565 kg)  05/27/15 171 lb (77.565 kg)    Ocular: Sclerae unicteric, pupils equal, round and reactive to light Ear-nose-throat: Oropharynx clear, dentition fair Lymphatic: No cervical or supraclavicular adenopathy Lungs no rales or rhonchi, good excursion bilaterally Heart regular rate and rhythm, no murmur appreciated Abd soft, nontender, positive bowel sounds MSK no focal spinal tenderness, no joint edema Neuro: non-focal, well-oriented, appropriate affect Breasts: Deferred  Lab Results  Component Value Date   WBC 4.4 07/13/2015   HGB 11.7 07/13/2015   HCT 35.3 07/13/2015   MCV 100 07/13/2015   PLT 114* 07/13/2015   Lab Results  Component Value Date   FERRITIN 397* 12/21/2012   IRON 85 12/21/2012   TIBC 226* 12/21/2012   UIBC 141 12/21/2012   IRONPCTSAT 38 12/21/2012    Lab Results  Component Value Date   RETICCTPCT 1.8 12/20/2012   RBC 3.54* 07/13/2015   Lab Results  Component Value Date   KPAFRELGTCHN 285.00* 07/13/2015   LAMBDASER 2.14 07/13/2015   KAPLAMBRATIO 133.18* 07/13/2015   Lab Results  Component Value Date   IGGSERUM 1940* 07/13/2015   IGA 192 07/13/2015   IGMSERUM 54 07/13/2015   Lab Results  Component Value Date   TOTALPROTELP 6.6 07/13/2015   ALBUMINELP 3.3* 07/13/2015   A1GS 0.2 07/13/2015   A2GS 0.8 07/13/2015   BETS 0.3* 07/13/2015   BETA2SER 0.3 07/13/2015   GAMS 1.7 07/13/2015   MSPIKE 0.21 12/09/2014   SPEI * 07/13/2015     Chemistry      Component Value Date/Time   NA 141 07/13/2015 1159   NA 143 12/23/2014 1239   NA 141 04/04/2014 0852   K 3.7 07/13/2015 1159   K 4.3 12/23/2014 1239   K 4.2 04/04/2014 0852   CL 111* 07/13/2015 1159   CL 110 12/23/2014 1239   CL 111* 03/29/2013 1351   CO2 24 07/13/2015 1159   CO2 22 12/23/2014 1239   CO2 22 04/04/2014 0852   BUN 11 07/13/2015 1159   BUN 20 12/23/2014 1239   BUN 20.9 04/04/2014 0852   CREATININE 0.6 07/13/2015 1159   CREATININE 1.01 12/23/2014 1239   CREATININE 1.2* 04/04/2014 0852      Component Value Date/Time   CALCIUM 9.7 07/13/2015 1159   CALCIUM 9.2 12/23/2014 1239   CALCIUM 9.4 04/04/2014 0852   ALKPHOS 84 07/13/2015 1159   ALKPHOS 89 12/23/2014 1239   ALKPHOS 94 04/04/2014 0852   AST 38 07/13/2015 1159   AST 19 12/23/2014 1239   AST 20 04/04/2014 0852   ALT 27 07/13/2015 1159   ALT 17 12/23/2014 1239   ALT 17 04/04/2014 0852   BILITOT 0.60 07/13/2015 1159   BILITOT 0.3 12/23/2014 1239   BILITOT 0.23 04/04/2014 0852     Impression and Plan: Jennifer Boyle is 49 yo African [place] female with IgG kappa myeloma. She underwent a stem cell transplant on May 23, 2014 at [facility]. She is here today with c/o fatigue, cough, fever, SOB and dehydration since Saturday.  Her chest xray showed a right upper lobe pneumonia.  We will give  her a dose of IV Rocephin today while she is here in our office. We will also give her fluids.  She will go home on Omnicef 600 mg BID for 10 days.  We will get a repeat chest xray and plan to see her back in 3 weeks. She knows  to contact us with any questions or concerns. We can certainly see her sooner if need be.   Eliezer Bottom, NP 10/10/20164:44 PM

## 2015-07-21 ENCOUNTER — Telehealth: Payer: Self-pay | Admitting: *Deleted

## 2015-07-21 ENCOUNTER — Other Ambulatory Visit: Payer: Commercial Managed Care - PPO

## 2015-07-21 NOTE — Telephone Encounter (Signed)
Patient is currently on hold with her Kyprolis treatment due to being ill (see encounter from 07/20/15). She is calling today to see if she should start her pomalyst cycle. Spoke to Dr Marin Olp who wants her to hold pomalyst until she starts her systemic treatment. She understands.

## 2015-07-22 ENCOUNTER — Ambulatory Visit: Payer: Self-pay

## 2015-07-22 ENCOUNTER — Other Ambulatory Visit: Payer: Self-pay

## 2015-07-23 ENCOUNTER — Other Ambulatory Visit: Payer: Self-pay | Admitting: Hematology & Oncology

## 2015-07-23 ENCOUNTER — Other Ambulatory Visit: Payer: Self-pay | Admitting: *Deleted

## 2015-07-23 ENCOUNTER — Telehealth: Payer: Self-pay | Admitting: *Deleted

## 2015-07-23 DIAGNOSIS — J13 Pneumonia due to Streptococcus pneumoniae: Secondary | ICD-10-CM

## 2015-07-23 MED ORDER — CEPHALEXIN 500 MG PO CAPS: 500.0000 mg | ORAL_CAPSULE | Freq: Four times a day (QID) | ORAL | Status: DC

## 2015-07-23 NOTE — Telephone Encounter (Signed)
Patient called stating that she has had diarrhea for 2 days since starting the [facility].  She does not feel like it has helped her pneumonia.  Shared with dr. Marin Olp.  Dr. Marin Olp ordered Keflex instead and told patient to start Immodium for diarrhea.  Patient understanding of above

## 2015-07-27 ENCOUNTER — Telehealth: Payer: Self-pay | Admitting: *Deleted

## 2015-07-27 ENCOUNTER — Telehealth: Payer: Self-pay | Admitting: Hematology & Oncology

## 2015-07-27 NOTE — Telephone Encounter (Signed)
Patient needs clarification on her upcoming appointment schedule. Her treatment was cancelled last week due to pneumonia, and she would like to know what the plan is going forward. Spoke to Dr Marin Olp who wants the appointments for this week and next to be cancelled. He will reassess her on 11/2 during her already scheduled appointment and decide then how to move forward with treatment.   Patient is aware of appointment changes and will see [place] on November 2nd.

## 2015-07-27 NOTE — Telephone Encounter (Signed)
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

## 2015-07-29 ENCOUNTER — Ambulatory Visit: Payer: Self-pay

## 2015-07-29 ENCOUNTER — Other Ambulatory Visit: Payer: Commercial Managed Care - PPO

## 2015-07-30 ENCOUNTER — Ambulatory Visit (HOSPITAL_BASED_OUTPATIENT_CLINIC_OR_DEPARTMENT_OTHER)
Admission: RE | Admit: 2015-07-30 | Discharge: 2015-07-30 | Disposition: A | Discharge status: Home / Self Care | Payer: Commercial Managed Care - PPO | Source: Ambulatory Visit | Attending: Hematology & Oncology | Admitting: Hematology & Oncology

## 2015-07-30 ENCOUNTER — Other Ambulatory Visit: Payer: Self-pay | Admitting: Nurse Practitioner

## 2015-07-30 DIAGNOSIS — C9002 Multiple myeloma in relapse: Secondary | ICD-10-CM

## 2015-07-30 DIAGNOSIS — R918 Other nonspecific abnormal finding of lung field: Secondary | ICD-10-CM | POA: Diagnosis not present

## 2015-07-30 DIAGNOSIS — J189 Pneumonia, unspecified organism: Secondary | ICD-10-CM

## 2015-07-30 MED ORDER — KETOROLAC TROMETHAMINE 10 MG PO TABS: 10.0000 mg | ORAL_TABLET | Freq: Four times a day (QID) | ORAL | Status: DC | PRN

## 2015-07-30 NOTE — Progress Notes (Signed)
Patient called stating her symptoms are still consistent with her prevous PNA diagnosis. Today is her last day taking the antibiotics and was concerned that it has not cleared up. Per Dr. Marin Olp the patient is coming in today for a f/u cxr and he will evaluate upon receiving those results. Pt is in agreement and understands the plan.

## 2015-07-30 NOTE — Telephone Encounter (Signed)
Per Dr. Marin Olp CXR has much improved and pt may just be experiencing chestwall pain. PO Toradol called in for her to see if this will help. No further abx therapy is needed at this time. Pt verbalized understanding and appreciation.

## 2015-08-05 ENCOUNTER — Other Ambulatory Visit: Payer: Self-pay

## 2015-08-05 ENCOUNTER — Ambulatory Visit: Payer: Self-pay

## 2015-08-12 ENCOUNTER — Ambulatory Visit (HOSPITAL_BASED_OUTPATIENT_CLINIC_OR_DEPARTMENT_OTHER): Payer: Commercial Managed Care - PPO

## 2015-08-12 ENCOUNTER — Ambulatory Visit (HOSPITAL_BASED_OUTPATIENT_CLINIC_OR_DEPARTMENT_OTHER): Payer: Commercial Managed Care - PPO | Admitting: Hematology & Oncology

## 2015-08-12 ENCOUNTER — Ambulatory Visit (HOSPITAL_BASED_OUTPATIENT_CLINIC_OR_DEPARTMENT_OTHER)
Admission: RE | Admit: 2015-08-12 | Discharge: 2015-08-12 | Disposition: A | Discharge status: Home / Self Care | Payer: Commercial Managed Care - PPO | Source: Ambulatory Visit | Attending: Family | Admitting: Family

## 2015-08-12 ENCOUNTER — Encounter: Payer: Self-pay | Admitting: Hematology & Oncology

## 2015-08-12 VITALS — BP 125/80 | HR 95 | Temp 98.4°F | Resp 16

## 2015-08-12 DIAGNOSIS — C9002 Multiple myeloma in relapse: Secondary | ICD-10-CM

## 2015-08-12 DIAGNOSIS — J189 Pneumonia, unspecified organism: Secondary | ICD-10-CM | POA: Diagnosis present

## 2015-08-12 DIAGNOSIS — C9000 Multiple myeloma not having achieved remission: Secondary | ICD-10-CM

## 2015-08-12 DIAGNOSIS — Z9484 Stem cells transplant status: Secondary | ICD-10-CM

## 2015-08-12 LAB — COMPREHENSIVE METABOLIC PANEL
ALBUMIN: 3.3 g/dL — AB (ref 3.6–5.1)
ALK PHOS: 98 U/L (ref 33–130)
ALT: 13 U/L (ref 6–29)
AST: 24 U/L (ref 10–35)
BILIRUBIN TOTAL: 0.4 mg/dL (ref 0.2–1.2)
BUN: 17 mg/dL (ref 7–25)
CO2: 24 mmol/L (ref 20–31)
Calcium: 8.7 mg/dL (ref 8.6–10.4)
Chloride: 107 mmol/L (ref 98–110)
Creatinine, Ser: 0.85 mg/dL (ref 0.50–1.05)
GLUCOSE: 96 mg/dL (ref 65–99)
Potassium: 4.1 mmol/L (ref 3.5–5.3)
SODIUM: 136 mmol/L (ref 135–146)
Total Protein: 7.8 g/dL (ref 6.1–8.1)

## 2015-08-12 LAB — CBC WITH DIFFERENTIAL (CANCER CENTER ONLY)
BASO#: 0 10*3/uL (ref 0.0–0.2)
BASO%: 0.2 % (ref 0.0–2.0)
EOS%: 3.8 % (ref 0.0–7.0)
Eosinophils Absolute: 0.2 10*3/uL (ref 0.0–0.5)
HCT: 30 % — ABNORMAL LOW (ref 34.8–46.6)
HEMOGLOBIN: 9.8 g/dL — AB (ref 11.6–15.9)
LYMPH#: 2.5 10*3/uL (ref 0.9–3.3)
LYMPH%: 48.1 % — AB (ref 14.0–48.0)
MCH: 33 pg (ref 26.0–34.0)
MCHC: 32.7 g/dL (ref 32.0–36.0)
MCV: 101 fL (ref 81–101)
MONO#: 0.8 10*3/uL (ref 0.1–0.9)
MONO%: 14.7 % — AB (ref 0.0–13.0)
NEUT%: 33.2 % — ABNORMAL LOW (ref 39.6–80.0)
NEUTROS ABS: 1.7 10*3/uL (ref 1.5–6.5)
Platelets: 128 10*3/uL — ABNORMAL LOW (ref 145–400)
RBC: 2.97 10*6/uL — ABNORMAL LOW (ref 3.70–5.32)
RDW: 15.3 % (ref 11.1–15.7)
WBC: 5.2 10*3/uL (ref 3.9–10.0)

## 2015-08-12 MED ORDER — HEPARIN SOD (PORK) LOCK FLUSH 100 UNIT/ML IV SOLN
500.0000 [IU] | Freq: Once | INTRAVENOUS | Status: AC
  Administered 2015-08-12: 500 [IU] via INTRAVENOUS
  Filled 2015-08-12: qty 5

## 2015-08-12 MED ORDER — SODIUM CHLORIDE 0.9 % IJ SOLN
10.0000 mL | INTRAMUSCULAR | Status: DC | PRN
  Administered 2015-08-12: 10 mL via INTRAVENOUS
  Filled 2015-08-12: qty 10

## 2015-08-12 NOTE — Progress Notes (Signed)
Hematology and Oncology Follow Up Visit  Jennifer Boyle [id number] 12/01/1965 49 y.o. 08/12/2015   Principle Diagnosis:   IgG kappa myeloma - relapsed   HIV-positive  Current Therapy:    S/p BMT - Autologous at [facility] in 05/2014  Kyprolis-every week dosing-start next week  Pomalidomide 4 mg by mouth daily (21/7) - next week     Interim History:  Jennifer Boyle is back for followup.  She's got over her pneumonia. We did a chest x-ray on her today. Thank you, the chest x-ray showed that the pneumonia hepatically resolved.  She has not yet started the Pomalidomide or Kyprolis. She did not want to start these with her having pneumonia. We will get started next week. I'll have see about getting the Pomalidomide sent to her.    She is working. She feels pretty good. Appetite is better. She's had no nausea vomiting. She had no cough.   her last myeloma studies done back a month ago showed her Kappa Lightchain to be 285 mg deciliter. Her IgG level was 1940 mg/dL. Her M spike was  1.0 grams per deciliter. She had she had 2 monoclonal spikes.   I have not done a repeat bone marrow biopsy on her. Hopefully, we will not have issues with adverse cytogenetics.    Currently, her performance status is ECOG 1.  Medications:  Current outpatient prescriptions:  .  Abacavir-Dolutegravir-Lamivud 600-50-300 MG TABS, Take 1 tablet by mouth every morning., Disp: 30 tablet, Rfl: 5 .  calcium carbonate (OS-CAL) 600 MG TABS tablet, Take 600 mg by mouth daily with breakfast., Disp: , Rfl:  .  cholecalciferol (VITAMIN D) 1000 UNITS tablet, Take 1 tablet (1,000 Units total) by mouth daily., Disp: 30 tablet, Rfl: 3 .  docusate sodium (COLACE) 100 MG capsule, Take 100 mg by mouth 2 (two) times daily., Disp: , Rfl:  .  ketorolac (TORADOL) 10 MG tablet, Take 1 tablet (10 mg total) by mouth every 6 (six) hours as needed., Disp: 20 tablet, Rfl: 0 .  lidocaine-prilocaine (EMLA) cream, Apply topically as needed.  Apply to portacath one hour before chemo access or blood draw., Disp: 30 g, Rfl: 3 .  ondansetron (ZOFRAN) 8 MG tablet, Take 8 mg by mouth every 8 (eight) hours as needed. , Disp: , Rfl:  .  polyethylene glycol (MIRALAX / GLYCOLAX) packet, Take 17 g by mouth as needed. , Disp: , Rfl:  .  pomalidomide (POMALYST) 4 MG capsule, Take 1 capsule (4 mg total) by mouth daily. Take with water on days 1-21. Repeat every 28 days., Disp: 21 capsule, Rfl: 4 .  TraMADol HCl 50 MG TBDP, Take by mouth every 6 (six) hours as needed., Disp: , Rfl:  .  valACYclovir (VALTREX) 500 MG tablet, TAKE 1 TABLET BY MOUTH EVERY DAY, Disp: 30 tablet, Rfl: 4 No current facility-administered medications for this visit.  Facility-Administered Medications Ordered in Other Visits:  .  sodium chloride 0.9 % injection 10 mL, 10 mL, Intravenous, PRN, Volanda Napoleon, MD, 10 mL at 08/12/15 1600  Allergies:  Allergies  Allergen Reactions  . Aspirin     Upset stomach   . Efavirenz Hives and Rash    (brand name sustiva)    Past Medical History, Surgical history, Social history, and Family History were reviewed and updated.  Review of Systems: As above  Physical Exam:  oral temperature is 98.4 F (36.9 C). Her blood pressure is 125/80 and her pulse is 95. Her respiration is 16.  [name]. Head and exam shows no ocular or oral lesions. There are no palpable cervical or supraclavicular lymph nodes. Lungs are clear. Cardiac exam regular rate and rhythm with no murmurs rubs or bruits. Abdomen is soft. She has good bowel sounds. There is no fluid wave. There is no palpable liver or spleen tip. Back exam shows no tenderness over the spine, ribs or hips. Extremities shows no clubbing, cyanosis or edema. Neurological exam shows no focal neurological deficits. Skin exam no rashes, ecchymosis or petechia.  Lab Results  Component Value Date   WBC 5.2 08/12/2015   HGB 9.8* 08/12/2015   HCT 30.0*  08/12/2015   MCV 101 08/12/2015   PLT 128* 08/12/2015     Chemistry      Component Value Date/Time   NA 141 07/13/2015 1159   NA 143 12/23/2014 1239   NA 141 04/04/2014 0852   K 3.7 07/13/2015 1159   K 4.3 12/23/2014 1239   K 4.2 04/04/2014 0852   CL 111* 07/13/2015 1159   CL 110 12/23/2014 1239   CL 111* 03/29/2013 1351   CO2 24 07/13/2015 1159   CO2 22 12/23/2014 1239   CO2 22 04/04/2014 0852   BUN 11 07/13/2015 1159   BUN 20 12/23/2014 1239   BUN 20.9 04/04/2014 0852   CREATININE 0.6 07/13/2015 1159   CREATININE 1.01 12/23/2014 1239   CREATININE 1.2* 04/04/2014 0852      Component Value Date/Time   CALCIUM 9.7 07/13/2015 1159   CALCIUM 9.2 12/23/2014 1239   CALCIUM 9.4 04/04/2014 0852   ALKPHOS 84 07/13/2015 1159   ALKPHOS 89 12/23/2014 1239   ALKPHOS 94 04/04/2014 0852   AST 38 07/13/2015 1159   AST 19 12/23/2014 1239   AST 20 04/04/2014 0852   ALT 27 07/13/2015 1159   ALT 17 12/23/2014 1239   ALT 17 04/04/2014 0852   BILITOT 0.60 07/13/2015 1159   BILITOT 0.3 12/23/2014 1239   BILITOT 0.23 04/04/2014 0852         Impression and Plan: Jennifer Boyle is 49 year old African [place] female. She has IgG kappa myeloma. She ultimately underwent a stem cell transplant. This was back on May 23, 2014. This was done at [facility].  Unfortunately, I do think she is progressing. Her light chain is going up.   we should be able to get the treatment program started next week. Hopefully, she will we able to get the Pomalidomide sent to her.    We should be able to get her started next week. We should be able to get a response. Hopefully, cytogenetics will not be necessary right now. However, we might have to consider this.   I'm unsure how agreeable she would be to a  Bone marrow biopsy. Maybe, we can get the cytogenetics from her peripheral blood.   I will plan to see her back in about a month.    Volanda Napoleon, MD 11/2/20165:34 PM

## 2015-08-12 NOTE — Progress Notes (Signed)
Jennifer Boyle presented for Portacath access and flush. Proper placement of portacath confirmed by CXR. Portacath located in the right chest wall accessed with  H 20 needle. Clean, Dry and Intact Good blood return present. Portacath flushed with 48ml NS and 500U/59ml Heparin per protocol and needle removed intact. Procedure without incident. Patient tolerated procedure well.

## 2015-08-12 NOTE — Patient Instructions (Signed)

## 2015-08-13 ENCOUNTER — Other Ambulatory Visit: Payer: Self-pay | Admitting: *Deleted

## 2015-08-13 DIAGNOSIS — C9002 Multiple myeloma in relapse: Secondary | ICD-10-CM

## 2015-08-13 MED ORDER — POMALIDOMIDE 4 MG PO CAPS: 4.0000 mg | ORAL_CAPSULE | Freq: Every day | ORAL | Status: DC

## 2015-08-14 ENCOUNTER — Telehealth: Payer: Self-pay

## 2015-08-14 LAB — PROTEIN ELECTROPHORESIS, SERUM, WITH REFLEX
ABNORMAL PROTEIN BAND1: 1.6 g/dL
ALBUMIN ELP: 3.2 g/dL — AB (ref 3.8–4.8)
Alpha-1-Globulin: 0.3 g/dL (ref 0.2–0.3)
Alpha-2-Globulin: 0.9 g/dL (ref 0.5–0.9)
BETA 2: 0.3 g/dL (ref 0.2–0.5)
Beta Globulin: 0.3 g/dL — ABNORMAL LOW (ref 0.4–0.6)
GAMMA GLOBULIN: 2.8 g/dL — AB (ref 0.8–1.7)
TOTAL PROTEIN, SERUM ELECTROPHOR: 7.8 g/dL (ref 6.1–8.1)

## 2015-08-14 LAB — IGG, IGA, IGM
IGA: 172 mg/dL (ref 69–380)
IGG (IMMUNOGLOBIN G), SERUM: 2930 mg/dL — AB (ref 690–1700)
IgM, Serum: 64 mg/dL (ref 52–322)

## 2015-08-14 LAB — KAPPA/LAMBDA LIGHT CHAINS
KAPPA FREE LGHT CHN: 1020 mg/dL — AB (ref 0.33–1.94)
KAPPA LAMBDA RATIO: 361.7 — AB (ref 0.26–1.65)
LAMBDA FREE LGHT CHN: 2.82 mg/dL — AB (ref 0.57–2.63)

## 2015-08-14 LAB — IFE INTERPRETATION

## 2015-08-14 NOTE — Telephone Encounter (Signed)
Received phone call confirmation from patient that she DID receive her Pomalyst today. dph

## 2015-08-19 ENCOUNTER — Ambulatory Visit (HOSPITAL_BASED_OUTPATIENT_CLINIC_OR_DEPARTMENT_OTHER): Payer: Commercial Managed Care - PPO

## 2015-08-19 VITALS — BP 138/71 | HR 102 | Temp 98.2°F | Resp 18

## 2015-08-19 DIAGNOSIS — C9002 Multiple myeloma in relapse: Secondary | ICD-10-CM | POA: Diagnosis not present

## 2015-08-19 DIAGNOSIS — Z5112 Encounter for antineoplastic immunotherapy: Secondary | ICD-10-CM | POA: Diagnosis not present

## 2015-08-19 DIAGNOSIS — C9000 Multiple myeloma not having achieved remission: Secondary | ICD-10-CM

## 2015-08-19 LAB — CMP (CANCER CENTER ONLY)
ALK PHOS: 90 U/L — AB (ref 26–84)
ALT: 16 U/L (ref 10–47)
AST: 26 U/L (ref 11–38)
Albumin: 2.7 g/dL — ABNORMAL LOW (ref 3.3–5.5)
BUN, Bld: 17 mg/dL (ref 7–22)
CALCIUM: 9.3 mg/dL (ref 8.0–10.3)
CO2: 22 meq/L (ref 18–33)
Chloride: 104 mEq/L (ref 98–108)
Creat: 1 mg/dl (ref 0.6–1.2)
GLUCOSE: 123 mg/dL — AB (ref 73–118)
POTASSIUM: 3.8 meq/L (ref 3.3–4.7)
Sodium: 142 mEq/L (ref 128–145)
Total Bilirubin: 0.7 mg/dl (ref 0.20–1.60)
Total Protein: 8.2 g/dL — ABNORMAL HIGH (ref 6.4–8.1)

## 2015-08-19 LAB — CBC WITH DIFFERENTIAL (CANCER CENTER ONLY)
BASO#: 0 10*3/uL (ref 0.0–0.2)
BASO%: 0.2 % (ref 0.0–2.0)
EOS ABS: 0.2 10*3/uL (ref 0.0–0.5)
EOS%: 3.4 % (ref 0.0–7.0)
HEMATOCRIT: 28.8 % — AB (ref 34.8–46.6)
HGB: 9.5 g/dL — ABNORMAL LOW (ref 11.6–15.9)
LYMPH#: 3 10*3/uL (ref 0.9–3.3)
LYMPH%: 45.6 % (ref 14.0–48.0)
MCH: 33.3 pg (ref 26.0–34.0)
MCHC: 33 g/dL (ref 32.0–36.0)
MCV: 101 fL (ref 81–101)
MONO#: 0.4 10*3/uL (ref 0.1–0.9)
MONO%: 5.7 % (ref 0.0–13.0)
NEUT#: 2.9 10*3/uL (ref 1.5–6.5)
NEUT%: 45.1 % (ref 39.6–80.0)
Platelets: 112 10*3/uL — ABNORMAL LOW (ref 145–400)
RBC: 2.85 10*6/uL — ABNORMAL LOW (ref 3.70–5.32)
RDW: 15.8 % — AB (ref 11.1–15.7)
WBC: 6.5 10*3/uL (ref 3.9–10.0)

## 2015-08-19 LAB — TECHNOLOGIST REVIEW CHCC SATELLITE

## 2015-08-19 MED ORDER — HEPARIN SOD (PORK) LOCK FLUSH 100 UNIT/ML IV SOLN
500.0000 [IU] | Freq: Once | INTRAVENOUS | Status: AC | PRN
  Administered 2015-08-19: 500 [IU]
  Filled 2015-08-19: qty 5

## 2015-08-19 MED ORDER — SODIUM CHLORIDE 0.9 % IV SOLN: Freq: Once | INTRAVENOUS | Status: DC

## 2015-08-19 MED ORDER — DEXTROSE 5 % IV SOLN
20.0000 mg/m2 | Freq: Once | INTRAVENOUS | Status: AC
  Administered 2015-08-19: 38 mg via INTRAVENOUS
  Filled 2015-08-19: qty 19

## 2015-08-19 MED ORDER — SODIUM CHLORIDE 0.9 % IJ SOLN
10.0000 mL | INTRAMUSCULAR | Status: DC | PRN
  Administered 2015-08-19: 10 mL
  Filled 2015-08-19: qty 10

## 2015-08-19 MED ORDER — ZOLEDRONIC ACID 4 MG/100ML IV SOLN
4.0000 mg | Freq: Once | INTRAVENOUS | Status: AC
Start: 2015-08-19 — End: 2015-08-19
  Administered 2015-08-19: 4 mg via INTRAVENOUS
  Filled 2015-08-19: qty 100

## 2015-08-19 MED ORDER — SODIUM CHLORIDE 0.9 % IV SOLN
Freq: Once | INTRAVENOUS | Status: AC
  Administered 2015-08-19: 14:00:00 via INTRAVENOUS

## 2015-08-19 MED ORDER — SODIUM CHLORIDE 0.9 % IV SOLN
Freq: Once | INTRAVENOUS | Status: AC
  Administered 2015-08-19: 14:00:00 via INTRAVENOUS
  Filled 2015-08-19: qty 4

## 2015-08-19 NOTE — Patient Instructions (Signed)
Carfilzomib injection What is this medicine? CARFILZOMIB (kar FILZ oh mib) targets a specific protein within cancer cells and stops the cancer cells from growing. It is used to treat multiple myeloma. This medicine may be used for other purposes; ask your health care provider or pharmacist if you have questions. What should I tell my health care provider before I take this medicine? They need to know if you have any of these conditions: -heart disease -history of blood clots -irregular heartbeat -kidney disease -liver disease -lung or breathing disease -an unusual or allergic reaction to carfilzomib, or other medicines, foods, dyes, or preservatives -pregnant or trying to get pregnant -breast-feeding How should I use this medicine? This medicine is for injection or infusion into a vein. It is given by a health care professional in a hospital or clinic setting. Talk to your pediatrician regarding the use of this medicine in children. Special care may be needed. Overdosage: If you think you have taken too much of this medicine contact a poison control center or emergency room at once. NOTE: This medicine is only for you. Do not share this medicine with others. What if I miss a dose? It is important not to miss your dose. Call your doctor or health care professional if you are unable to keep an appointment. What may interact with this medicine? Interactions are not expected. Give your health care provider a list of all the medicines, herbs, non-prescription drugs, or dietary supplements you use. Also tell them if you smoke, drink alcohol, or use illegal drugs. Some items may interact with your medicine. This list may not describe all possible interactions. Give your health care provider a list of all the medicines, herbs, non-prescription drugs, or dietary supplements you use. Also tell them if you smoke, drink alcohol, or use illegal drugs. Some items may interact with your medicine. What  should I watch for while using this medicine? Your condition will be monitored carefully while you are receiving this medicine. Report any side effects. Continue your course of treatment even though you feel ill unless your doctor tells you to stop. You may need blood work done while you are taking this medicine. Do not become pregnant while taking this medicine or for at least 30 days after stopping it. Women should inform their doctor if they wish to become pregnant or think they might be pregnant. There is a potential for serious side effects to an unborn child. Men should not father a child while taking this medicine and for 90 days after stopping it. Talk to your health care professional or pharmacist for more information. Do not breast-feed an infant while taking this medicine. Check with your doctor or health care professional if you get an attack of severe diarrhea, nausea and vomiting, or if you sweat a lot. The loss of too much body fluid can make it dangerous for you to take this medicine. You may get dizzy. Do not drive, use machinery, or do anything that needs mental alertness until you know how this medicine affects you. Do not stand or sit up quickly, especially if you are an older patient. This reduces the risk of dizzy or fainting spells. What side effects may I notice from receiving this medicine? Side effects that you should report to your doctor or health care professional as soon as possible: -allergic reactions like skin rash, itching or hives, swelling of the face, lips, or tongue -confusion -dizziness -feeling faint or lightheaded -fever or chills -palpitations -seizures -signs and   symptoms of bleeding such as bloody or black, tarry stools; red or dark-brown urine; spitting up blood or brown material that looks like coffee grounds; red spots on the skin; unusual bruising or bleeding including from the eye, gums, or nose -signs and symptoms of a blood clot such as breathing  problems; changes in vision; chest pain; severe, sudden headache; pain, swelling, warmth in the leg; trouble speaking; sudden numbness or weakness of the face, arm or leg -signs and symptoms of kidney injury like trouble passing urine or change in the amount of urine -signs and symptoms of liver injury like dark yellow or brown urine; general ill feeling or flu-like symptoms; light-colored stools; loss of appetite; nausea; right upper belly pain; unusually weak or tired; yellowing of the eyes or skin Side effects that usually do not require medical attention (report to your doctor or health care professional if they continue or are bothersome): -back pain -cough -diarrhea -headache -muscle cramps -vomiting This list may not describe all possible side effects. Call your doctor for medical advice about side effects. You may report side effects to FDA at 1-800-FDA-1088. Where should I keep my medicine? This drug is given in a hospital or clinic and will not be stored at home. NOTE: This sheet is a summary. It may not cover all possible information. If you have questions about this medicine, talk to your doctor, pharmacist, or health care provider.    2016, Elsevier/Gold Standard. (2015-05-19 16:16:00)  

## 2015-08-21 LAB — PROTEIN ELECTROPHORESIS, SERUM, WITH REFLEX
Abnormal Protein Band1: 1.5 g/dL
Albumin ELP: 3 g/dL — ABNORMAL LOW (ref 3.8–4.8)
Alpha-1-Globulin: 0.4 g/dL — ABNORMAL HIGH (ref 0.2–0.3)
Alpha-2-Globulin: 1 g/dL — ABNORMAL HIGH (ref 0.5–0.9)
Beta 2: 0.3 g/dL (ref 0.2–0.5)
Beta Globulin: 0.3 g/dL — ABNORMAL LOW (ref 0.4–0.6)
Gamma Globulin: 2.6 g/dL — ABNORMAL HIGH (ref 0.8–1.7)
Total Protein, Serum Electrophoresis: 7.5 g/dL (ref 6.1–8.1)

## 2015-08-21 LAB — IGG, IGA, IGM
IGM, SERUM: 65 mg/dL (ref 52–322)
IgA: 163 mg/dL (ref 69–380)
IgG (Immunoglobin G), Serum: 2720 mg/dL — ABNORMAL HIGH (ref 690–1700)

## 2015-08-21 LAB — IFE INTERPRETATION

## 2015-08-25 ENCOUNTER — Other Ambulatory Visit: Payer: Self-pay | Admitting: Nurse Practitioner

## 2015-08-25 DIAGNOSIS — C9002 Multiple myeloma in relapse: Secondary | ICD-10-CM

## 2015-08-26 ENCOUNTER — Other Ambulatory Visit (HOSPITAL_BASED_OUTPATIENT_CLINIC_OR_DEPARTMENT_OTHER): Payer: Commercial Managed Care - PPO

## 2015-08-26 ENCOUNTER — Ambulatory Visit (HOSPITAL_BASED_OUTPATIENT_CLINIC_OR_DEPARTMENT_OTHER): Payer: Commercial Managed Care - PPO

## 2015-08-26 ENCOUNTER — Ambulatory Visit (HOSPITAL_COMMUNITY)
Admission: RE | Admit: 2015-08-26 | Discharge: 2015-08-26 | Disposition: A | Discharge status: Home / Self Care | Payer: Commercial Managed Care - PPO | Source: Ambulatory Visit | Attending: Hematology & Oncology | Admitting: Hematology & Oncology

## 2015-08-26 ENCOUNTER — Ambulatory Visit (HOSPITAL_BASED_OUTPATIENT_CLINIC_OR_DEPARTMENT_OTHER): Payer: Commercial Managed Care - PPO | Admitting: Hematology & Oncology

## 2015-08-26 ENCOUNTER — Encounter: Payer: Self-pay | Admitting: Hematology & Oncology

## 2015-08-26 VITALS — BP 130/75 | HR 90 | Temp 98.4°F | Resp 16

## 2015-08-26 VITALS — BP 130/75 | HR 87 | Temp 98.4°F | Resp 18 | Ht 64.0 in | Wt 167.0 lb

## 2015-08-26 DIAGNOSIS — B20 Human immunodeficiency virus [HIV] disease: Secondary | ICD-10-CM

## 2015-08-26 DIAGNOSIS — C9000 Multiple myeloma not having achieved remission: Secondary | ICD-10-CM | POA: Insufficient documentation

## 2015-08-26 DIAGNOSIS — Z9484 Stem cells transplant status: Secondary | ICD-10-CM | POA: Diagnosis not present

## 2015-08-26 DIAGNOSIS — D649 Anemia, unspecified: Secondary | ICD-10-CM

## 2015-08-26 DIAGNOSIS — C9002 Multiple myeloma in relapse: Secondary | ICD-10-CM

## 2015-08-26 DIAGNOSIS — Z5112 Encounter for antineoplastic immunotherapy: Secondary | ICD-10-CM

## 2015-08-26 LAB — CBC WITH DIFFERENTIAL (CANCER CENTER ONLY)
BASO#: 0 10*3/uL (ref 0.0–0.2)
BASO%: 0.1 % (ref 0.0–2.0)
EOS%: 3.8 % (ref 0.0–7.0)
Eosinophils Absolute: 0.3 10*3/uL (ref 0.0–0.5)
HEMATOCRIT: 27.3 % — AB (ref 34.8–46.6)
HEMOGLOBIN: 8.8 g/dL — AB (ref 11.6–15.9)
LYMPH#: 5.7 10*3/uL — AB (ref 0.9–3.3)
LYMPH%: 64.4 % — ABNORMAL HIGH (ref 14.0–48.0)
MCH: 32.6 pg (ref 26.0–34.0)
MCHC: 32.2 g/dL (ref 32.0–36.0)
MCV: 101 fL (ref 81–101)
MONO#: 1 10*3/uL — AB (ref 0.1–0.9)
MONO%: 11.4 % (ref 0.0–13.0)
NEUT%: 20.3 % — ABNORMAL LOW (ref 39.6–80.0)
NEUTROS ABS: 1.8 10*3/uL (ref 1.5–6.5)
Platelets: 115 10*3/uL — ABNORMAL LOW (ref 145–400)
RBC: 2.7 10*6/uL — AB (ref 3.70–5.32)
RDW: 16.3 % — AB (ref 11.1–15.7)
WBC: 8.8 10*3/uL (ref 3.9–10.0)

## 2015-08-26 LAB — CMP (CANCER CENTER ONLY)
ALBUMIN: 2.4 g/dL — AB (ref 3.3–5.5)
ALK PHOS: 91 U/L — AB (ref 26–84)
ALT: 21 U/L (ref 10–47)
AST: 23 U/L (ref 11–38)
BILIRUBIN TOTAL: 0.6 mg/dL (ref 0.20–1.60)
BUN, Bld: 26 mg/dL — ABNORMAL HIGH (ref 7–22)
CALCIUM: 9.3 mg/dL (ref 8.0–10.3)
CO2: 22 mEq/L (ref 18–33)
Chloride: 109 mEq/L — ABNORMAL HIGH (ref 98–108)
Creat: 1.4 mg/dl — ABNORMAL HIGH (ref 0.6–1.2)
GLUCOSE: 99 mg/dL (ref 73–118)
Potassium: 4 mEq/L (ref 3.3–4.7)
Sodium: 147 mEq/L — ABNORMAL HIGH (ref 128–145)
TOTAL PROTEIN: 7.6 g/dL (ref 6.4–8.1)

## 2015-08-26 LAB — PREPARE RBC (CROSSMATCH)

## 2015-08-26 LAB — HOLD TUBE, BLOOD BANK

## 2015-08-26 MED ORDER — HEPARIN SOD (PORK) LOCK FLUSH 100 UNIT/ML IV SOLN
500.0000 [IU] | Freq: Once | INTRAVENOUS | Status: AC | PRN
  Administered 2015-08-26: 500 [IU]
  Filled 2015-08-26: qty 5

## 2015-08-26 MED ORDER — CARFILZOMIB CHEMO INJECTION 60 MG
45.0000 mg/m2 | Freq: Once | INTRAVENOUS | Status: AC
  Administered 2015-08-26: 84 mg via INTRAVENOUS
  Filled 2015-08-26: qty 42

## 2015-08-26 MED ORDER — SODIUM CHLORIDE 0.9 % IJ SOLN
10.0000 mL | INTRAMUSCULAR | Status: DC | PRN
  Administered 2015-08-26: 10 mL
  Filled 2015-08-26: qty 10

## 2015-08-26 MED ORDER — SODIUM CHLORIDE 0.9 % IV SOLN
Freq: Once | INTRAVENOUS | Status: AC
  Administered 2015-08-26: 16:00:00 via INTRAVENOUS
  Filled 2015-08-26: qty 4

## 2015-08-26 MED ORDER — SODIUM CHLORIDE 0.9 % IV SOLN: Freq: Once | INTRAVENOUS | Status: DC

## 2015-08-26 MED ORDER — SODIUM CHLORIDE 0.9 % IV SOLN
Freq: Once | INTRAVENOUS | Status: AC
  Administered 2015-08-26: 16:00:00 via INTRAVENOUS

## 2015-08-26 NOTE — Patient Instructions (Signed)
Carfilzomib injection What is this medicine? CARFILZOMIB (kar FILZ oh mib) targets a specific protein within cancer cells and stops the cancer cells from growing. It is used to treat multiple myeloma. This medicine may be used for other purposes; ask your health care provider or pharmacist if you have questions. What should I tell my health care provider before I take this medicine? They need to know if you have any of these conditions: -heart disease -history of blood clots -irregular heartbeat -kidney disease -liver disease -lung or breathing disease -an unusual or allergic reaction to carfilzomib, or other medicines, foods, dyes, or preservatives -pregnant or trying to get pregnant -breast-feeding How should I use this medicine? This medicine is for injection or infusion into a vein. It is given by a health care professional in a hospital or clinic setting. Talk to your pediatrician regarding the use of this medicine in children. Special care may be needed. Overdosage: If you think you have taken too much of this medicine contact a poison control center or emergency room at once. NOTE: This medicine is only for you. Do not share this medicine with others. What if I miss a dose? It is important not to miss your dose. Call your doctor or health care professional if you are unable to keep an appointment. What may interact with this medicine? Interactions are not expected. Give your health care provider a list of all the medicines, herbs, non-prescription drugs, or dietary supplements you use. Also tell them if you smoke, drink alcohol, or use illegal drugs. Some items may interact with your medicine. This list may not describe all possible interactions. Give your health care provider a list of all the medicines, herbs, non-prescription drugs, or dietary supplements you use. Also tell them if you smoke, drink alcohol, or use illegal drugs. Some items may interact with your medicine. What  should I watch for while using this medicine? Your condition will be monitored carefully while you are receiving this medicine. Report any side effects. Continue your course of treatment even though you feel ill unless your doctor tells you to stop. You may need blood work done while you are taking this medicine. Do not become pregnant while taking this medicine or for at least 30 days after stopping it. Women should inform their doctor if they wish to become pregnant or think they might be pregnant. There is a potential for serious side effects to an unborn child. Men should not father a child while taking this medicine and for 90 days after stopping it. Talk to your health care professional or pharmacist for more information. Do not breast-feed an infant while taking this medicine. Check with your doctor or health care professional if you get an attack of severe diarrhea, nausea and vomiting, or if you sweat a lot. The loss of too much body fluid can make it dangerous for you to take this medicine. You may get dizzy. Do not drive, use machinery, or do anything that needs mental alertness until you know how this medicine affects you. Do not stand or sit up quickly, especially if you are an older patient. This reduces the risk of dizzy or fainting spells. What side effects may I notice from receiving this medicine? Side effects that you should report to your doctor or health care professional as soon as possible: -allergic reactions like skin rash, itching or hives, swelling of the face, lips, or tongue -confusion -dizziness -feeling faint or lightheaded -fever or chills -palpitations -seizures -signs and   symptoms of bleeding such as bloody or black, tarry stools; red or dark-brown urine; spitting up blood or brown material that looks like coffee grounds; red spots on the skin; unusual bruising or bleeding including from the eye, gums, or nose -signs and symptoms of a blood clot such as breathing  problems; changes in vision; chest pain; severe, sudden headache; pain, swelling, warmth in the leg; trouble speaking; sudden numbness or weakness of the face, arm or leg -signs and symptoms of kidney injury like trouble passing urine or change in the amount of urine -signs and symptoms of liver injury like dark yellow or brown urine; general ill feeling or flu-like symptoms; light-colored stools; loss of appetite; nausea; right upper belly pain; unusually weak or tired; yellowing of the eyes or skin Side effects that usually do not require medical attention (report to your doctor or health care professional if they continue or are bothersome): -back pain -cough -diarrhea -headache -muscle cramps -vomiting This list may not describe all possible side effects. Call your doctor for medical advice about side effects. You may report side effects to FDA at 1-800-FDA-1088. Where should I keep my medicine? This drug is given in a hospital or clinic and will not be stored at home. NOTE: This sheet is a summary. It may not cover all possible information. If you have questions about this medicine, talk to your doctor, pharmacist, or health care provider.    2016, Elsevier/Gold Standard. (2015-05-19 16:16:00)  

## 2015-08-26 NOTE — Progress Notes (Signed)
Hematology and Oncology Follow Up Visit  Jennifer Boyle [id number] 1966/02/05 49 y.o. 08/26/2015   Principle Diagnosis:   IgG kappa myeloma - relapsed   HIV-positive  Current Therapy:    S/p BMT - Autologous at [facility] in 05/2014  Kyprolis-every week dosing-  Pomalidomide 4 mg by mouth daily (21/7)      Interim History:  Ms.  Boyle is back for followup.  She's feeling a little tired. She thinks this is probably because of anemia. She thinks she will fill better if she is transfused. Her hemoglobin is 8.8. This certainly could be from the myeloma.  We last saw her, we checked her myeloma studies. Her IgG level was up to 2930. More importantly, was a fact that her Kappa Lightchain was 1020 mg/dL.[name] She is noted that she has foaming in her urine. I think this is by the light chains.  She is not having any bone pain.  She is still working. She finds it a little more difficult to work. She gets tired more easily.  She's had no fever. She got over her pneumonia. She's had no cough.  There's not been any leg swelling.  Currently, her performance status is ECOG 1.  Medications:  Current outpatient prescriptions:  .  Abacavir-Dolutegravir-Lamivud 600-50-300 MG TABS, Take 1 tablet by mouth every morning., Disp: 30 tablet, Rfl: 5 .  calcium carbonate (OS-CAL) 600 MG TABS tablet, Take 600 mg by mouth daily with breakfast., Disp: , Rfl:  .  cholecalciferol (VITAMIN D) 1000 UNITS tablet, Take 1 tablet (1,000 Units total) by mouth daily., Disp: 30 tablet, Rfl: 3 .  docusate sodium (COLACE) 100 MG capsule, Take 100 mg by mouth 2 (two) times daily., Disp: , Rfl:  .  ketorolac (TORADOL) 10 MG tablet, Take 1 tablet (10 mg total) by mouth every 6 (six) hours as needed., Disp: 20 tablet, Rfl: 0 .  lidocaine-prilocaine (EMLA) cream, Apply topically as needed. Apply to portacath one hour before chemo access or blood draw., Disp: 30 g, Rfl: 3 .  ondansetron (ZOFRAN) 8 MG tablet, Take 8 mg by mouth  every 8 (eight) hours as needed. , Disp: , Rfl:  .  polyethylene glycol (MIRALAX / GLYCOLAX) packet, Take 17 g by mouth as needed. , Disp: , Rfl:  .  pomalidomide (POMALYST) 4 MG capsule, Take 1 capsule (4 mg total) by mouth daily. Take with water on days 1-21. Repeat every 28 days. EPPI#[id number], Disp: 21 capsule, Rfl: 0 .  TraMADol HCl 50 MG TBDP, Take by mouth every 6 (six) hours as needed., Disp: , Rfl:  .  valACYclovir (VALTREX) 500 MG tablet, TAKE 1 TABLET BY MOUTH EVERY DAY, Disp: 30 tablet, Rfl: 4 No current facility-administered medications for this visit.  Facility-Administered Medications Ordered in Other Visits:  .  0.9 %  sodium chloride infusion, , Intravenous, Once, Volanda Napoleon, MD .  sodium chloride 0.9 % injection 10 mL, 10 mL, Intracatheter, PRN, Volanda Napoleon, MD, 10 mL at 08/26/15 1727  Allergies:  Allergies  Allergen Reactions  . Aspirin     Upset stomach   . Efavirenz Hives and Rash    (brand name sustiva)    Past Medical History, Surgical history, Social history, and Family History were reviewed and updated.  Review of Systems: As above  Physical Exam:  height is $RemoveB'5\' 4"'OcMOfUWJ$  (1.626 m) and weight is 167 lb (75.751 kg). Her oral temperature is 98.4 F (36.9 C). Her blood pressure is 130/75 and her pulse is  87. Her respiration is 18.   [name]. Head and exam shows no ocular or oral lesions. There are no palpable cervical or supraclavicular lymph nodes. Lungs are clear. Cardiac exam regular rate and rhythm with no murmurs rubs or bruits. Abdomen is soft. She has good bowel sounds. There is no fluid wave. There is no palpable liver or spleen tip. Back exam shows no tenderness over the spine, ribs or hips. Extremities shows no clubbing, cyanosis or edema. Neurological exam shows no focal neurological deficits. Skin exam no rashes, ecchymosis or petechia.  Lab Results  Component Value Date   WBC 8.8 08/26/2015   HGB 8.8*  08/26/2015   HCT 27.3* 08/26/2015   MCV 101 08/26/2015   PLT 115* 08/26/2015     Chemistry      Component Value Date/Time   NA 147* 08/26/2015 1406   NA 136 08/12/2015 1522   NA 141 04/04/2014 0852   K 4.0 08/26/2015 1406   K 4.1 08/12/2015 1522   K 4.2 04/04/2014 0852   CL 109* 08/26/2015 1406   CL 107 08/12/2015 1522   CL 111* 03/29/2013 1351   CO2 22 08/26/2015 1406   CO2 24 08/12/2015 1522   CO2 22 04/04/2014 0852   BUN 26* 08/26/2015 1406   BUN 17 08/12/2015 1522   BUN 20.9 04/04/2014 0852   CREATININE 1.4* 08/26/2015 1406   CREATININE 0.85 08/12/2015 1522   CREATININE 1.2* 04/04/2014 0852      Component Value Date/Time   CALCIUM 9.3 08/26/2015 1406   CALCIUM 8.7 08/12/2015 1522   CALCIUM 9.4 04/04/2014 0852   ALKPHOS 91* 08/26/2015 1406   ALKPHOS 98 08/12/2015 1522   ALKPHOS 94 04/04/2014 0852   AST 23 08/26/2015 1406   AST 24 08/12/2015 1522   AST 20 04/04/2014 0852   ALT 21 08/26/2015 1406   ALT 13 08/12/2015 1522   ALT 17 04/04/2014 0852   BILITOT 0.60 08/26/2015 1406   BILITOT 0.4 08/12/2015 1522   BILITOT 0.23 04/04/2014 0852         Impression and Plan: Jennifer Boyle is 49 year old African [place] female. She has IgG kappa myeloma. She ultimately underwent a stem cell transplant. This was back on May 23, 2014. This was done at [facility].  Unfortunately, because of her progressive disease, we have initiated therapy on her.  We will go ahead and transfuse her tomorrow. Hopefully, this will make her feel a little bit better.  I feel confident that the Kyprolis and Pomalidomide combination will help.  I would like to avoid having to do a bone marrow biopsy on her. I think if she responds, then we can hold off on the bone marrow. If she does not respond, then I the we have no choice but have to put her through a bone marrow biopsy.  Thankfully, her HIV is doing okay. There is no evidence at this is becoming active.  We will plan to see her  back in another couple weeks. I think we have to stay fairly close to her and monitor her labs. .   I will plan to see her back in about a month.    Volanda Napoleon, MD 11/16/20165:49 PM

## 2015-08-27 ENCOUNTER — Other Ambulatory Visit: Payer: Self-pay

## 2015-08-27 ENCOUNTER — Other Ambulatory Visit: Payer: Self-pay | Admitting: Nurse Practitioner

## 2015-08-27 ENCOUNTER — Ambulatory Visit (HOSPITAL_BASED_OUTPATIENT_CLINIC_OR_DEPARTMENT_OTHER): Payer: Commercial Managed Care - PPO

## 2015-08-27 ENCOUNTER — Encounter (HOSPITAL_BASED_OUTPATIENT_CLINIC_OR_DEPARTMENT_OTHER): Payer: Self-pay | Admitting: Emergency Medicine

## 2015-08-27 ENCOUNTER — Emergency Department (HOSPITAL_BASED_OUTPATIENT_CLINIC_OR_DEPARTMENT_OTHER): Payer: Commercial Managed Care - PPO

## 2015-08-27 ENCOUNTER — Inpatient Hospital Stay (HOSPITAL_COMMUNITY): Payer: Commercial Managed Care - PPO

## 2015-08-27 ENCOUNTER — Inpatient Hospital Stay (HOSPITAL_BASED_OUTPATIENT_CLINIC_OR_DEPARTMENT_OTHER)
Admission: EM | Admit: 2015-08-27 | Discharge: 2015-08-28 | DRG: 811 | Disposition: A | Discharge status: Home / Self Care | Payer: Commercial Managed Care - PPO | Attending: Internal Medicine | Admitting: Internal Medicine

## 2015-08-27 VITALS — BP 157/119 | HR 121 | Temp 99.2°F | Resp 24

## 2015-08-27 DIAGNOSIS — C9002 Multiple myeloma in relapse: Secondary | ICD-10-CM

## 2015-08-27 DIAGNOSIS — Z9221 Personal history of antineoplastic chemotherapy: Secondary | ICD-10-CM | POA: Diagnosis not present

## 2015-08-27 DIAGNOSIS — T8092XD Unspecified transfusion reaction, subsequent encounter: Secondary | ICD-10-CM | POA: Diagnosis not present

## 2015-08-27 DIAGNOSIS — D630 Anemia in neoplastic disease: Secondary | ICD-10-CM | POA: Diagnosis present

## 2015-08-27 DIAGNOSIS — Z886 Allergy status to analgesic agent status: Secondary | ICD-10-CM | POA: Diagnosis not present

## 2015-08-27 DIAGNOSIS — B379 Candidiasis, unspecified: Secondary | ICD-10-CM | POA: Diagnosis present

## 2015-08-27 DIAGNOSIS — D649 Anemia, unspecified: Secondary | ICD-10-CM | POA: Diagnosis not present

## 2015-08-27 DIAGNOSIS — D696 Thrombocytopenia, unspecified: Secondary | ICD-10-CM | POA: Diagnosis present

## 2015-08-27 DIAGNOSIS — T8089XA Other complications following infusion, transfusion and therapeutic injection, initial encounter: Principal | ICD-10-CM | POA: Diagnosis present

## 2015-08-27 DIAGNOSIS — Y848 Other medical procedures as the cause of abnormal reaction of the patient, or of later complication, without mention of misadventure at the time of the procedure: Secondary | ICD-10-CM | POA: Diagnosis present

## 2015-08-27 DIAGNOSIS — R319 Hematuria, unspecified: Secondary | ICD-10-CM | POA: Diagnosis present

## 2015-08-27 DIAGNOSIS — R0902 Hypoxemia: Secondary | ICD-10-CM | POA: Diagnosis present

## 2015-08-27 DIAGNOSIS — E739 Lactose intolerance, unspecified: Secondary | ICD-10-CM | POA: Diagnosis present

## 2015-08-27 DIAGNOSIS — C9000 Multiple myeloma not having achieved remission: Secondary | ICD-10-CM | POA: Diagnosis present

## 2015-08-27 DIAGNOSIS — Z888 Allergy status to other drugs, medicaments and biological substances status: Secondary | ICD-10-CM

## 2015-08-27 DIAGNOSIS — N179 Acute kidney failure, unspecified: Secondary | ICD-10-CM | POA: Diagnosis not present

## 2015-08-27 DIAGNOSIS — Z9481 Bone marrow transplant status: Secondary | ICD-10-CM | POA: Diagnosis not present

## 2015-08-27 DIAGNOSIS — B20 Human immunodeficiency virus [HIV] disease: Secondary | ICD-10-CM | POA: Diagnosis not present

## 2015-08-27 DIAGNOSIS — R0602 Shortness of breath: Secondary | ICD-10-CM | POA: Diagnosis not present

## 2015-08-27 DIAGNOSIS — D6481 Anemia due to antineoplastic chemotherapy: Secondary | ICD-10-CM | POA: Diagnosis present

## 2015-08-27 DIAGNOSIS — T8092XA Unspecified transfusion reaction, initial encounter: Secondary | ICD-10-CM | POA: Diagnosis not present

## 2015-08-27 DIAGNOSIS — Z9484 Stem cells transplant status: Secondary | ICD-10-CM | POA: Diagnosis not present

## 2015-08-27 LAB — BASIC METABOLIC PANEL
Anion gap: 9 (ref 5–15)
BUN: 36 mg/dL — AB (ref 6–20)
CALCIUM: 8.3 mg/dL — AB (ref 8.9–10.3)
CHLORIDE: 109 mmol/L (ref 101–111)
CO2: 17 mmol/L — AB (ref 22–32)
CREATININE: 2.09 mg/dL — AB (ref 0.44–1.00)
GFR calc Af Amer: 30 mL/min — ABNORMAL LOW (ref >60)
GFR calc non Af Amer: 26 mL/min — ABNORMAL LOW (ref >60)
GLUCOSE: 113 mg/dL — AB (ref 65–99)
Potassium: 4 mmol/L (ref 3.5–5.1)
Sodium: 135 mmol/L (ref 135–145)

## 2015-08-27 LAB — CBC WITH DIFFERENTIAL/PLATELET
Basophils Absolute: 0 10*3/uL (ref 0.0–0.1)
Basophils Relative: 0 %
EOS ABS: 0.1 10*3/uL (ref 0.0–0.7)
EOS PCT: 1 %
HCT: 32.4 % — ABNORMAL LOW (ref 36.0–46.0)
HEMOGLOBIN: 11 g/dL — AB (ref 12.0–15.0)
LYMPHS ABS: 4.6 10*3/uL — AB (ref 0.7–4.0)
Lymphocytes Relative: 59 %
MCH: 32 pg (ref 26.0–34.0)
MCHC: 34 g/dL (ref 30.0–36.0)
MCV: 94.2 fL (ref 78.0–100.0)
MONO ABS: 0.6 10*3/uL (ref 0.1–1.0)
MONOS PCT: 7 %
NEUTROS PCT: 33 %
Neutro Abs: 2.5 10*3/uL (ref 1.7–7.7)
Platelets: 87 10*3/uL — ABNORMAL LOW (ref 150–400)
RBC: 3.44 MIL/uL — ABNORMAL LOW (ref 3.87–5.11)
RDW: 16.9 % — AB (ref 11.5–15.5)
WBC: 7.8 10*3/uL (ref 4.0–10.5)

## 2015-08-27 LAB — URINALYSIS W MICROSCOPIC (NOT AT ARMC)
BILIRUBIN URINE: NEGATIVE
GLUCOSE, UA: NEGATIVE mg/dL
KETONES UR: NEGATIVE mg/dL
NITRITE: NEGATIVE
Specific Gravity, Urine: 1.011 (ref 1.005–1.030)
pH: 7 (ref 5.0–8.0)

## 2015-08-27 LAB — URINALYSIS, ROUTINE W REFLEX MICROSCOPIC
BILIRUBIN URINE: NEGATIVE
Glucose, UA: NEGATIVE mg/dL
KETONES UR: NEGATIVE mg/dL
Leukocytes, UA: NEGATIVE
NITRITE: NEGATIVE
Protein, ur: 100 mg/dL — AB
Specific Gravity, Urine: 1.01 (ref 1.005–1.030)
pH: 6.5 (ref 5.0–8.0)

## 2015-08-27 LAB — URINE MICROSCOPIC-ADD ON

## 2015-08-27 LAB — BRAIN NATRIURETIC PEPTIDE: B NATRIURETIC PEPTIDE 5: 305.7 pg/mL — AB (ref 0.0–100.0)

## 2015-08-27 LAB — TROPONIN I
TROPONIN I: 0.04 ng/mL — AB (ref <0.031)
Troponin I: 0.03 ng/mL (ref <0.031)

## 2015-08-27 LAB — I-STAT CG4 LACTIC ACID, ED: LACTIC ACID, VENOUS: 1.79 mmol/L (ref 0.5–2.0)

## 2015-08-27 MED ORDER — SODIUM CHLORIDE 0.9 % IV SOLN: 125.0000 mg | Freq: Once | INTRAVENOUS | Status: DC

## 2015-08-27 MED ORDER — ALBUTEROL SULFATE (2.5 MG/3ML) 0.083% IN NEBU
2.5000 mg | INHALATION_SOLUTION | Freq: Once | RESPIRATORY_TRACT | Status: AC
  Administered 2015-08-27: 2.5 mg via RESPIRATORY_TRACT
  Filled 2015-08-27: qty 3

## 2015-08-27 MED ORDER — FUROSEMIDE 10 MG/ML IJ SOLN
INTRAMUSCULAR | Status: AC
  Filled 2015-08-27: qty 4

## 2015-08-27 MED ORDER — MEPERIDINE HCL 25 MG/ML IJ SOLN
INTRAMUSCULAR | Status: AC
  Filled 2015-08-27: qty 1

## 2015-08-27 MED ORDER — METHYLPREDNISOLONE SODIUM SUCC 125 MG IJ SOLR
125.0000 mg | Freq: Once | INTRAMUSCULAR | Status: AC
  Administered 2015-08-27: 125 mg via INTRAVENOUS

## 2015-08-27 MED ORDER — TRAMADOL HCL 50 MG PO TABS: 50.0000 mg | ORAL_TABLET | Freq: Four times a day (QID) | ORAL | Status: DC | PRN

## 2015-08-27 MED ORDER — POMALIDOMIDE 4 MG PO CAPS: 4.0000 mg | ORAL_CAPSULE | Freq: Every day | ORAL | Status: AC

## 2015-08-27 MED ORDER — ALBUTEROL SULFATE (2.5 MG/3ML) 0.083% IN NEBU
INHALATION_SOLUTION | RESPIRATORY_TRACT | Status: AC
  Filled 2015-08-27: qty 3

## 2015-08-27 MED ORDER — HYDRALAZINE HCL 20 MG/ML IJ SOLN
10.0000 mg | INTRAMUSCULAR | Status: DC | PRN
  Administered 2015-08-28: 10 mg via INTRAVENOUS
  Filled 2015-08-27: qty 1

## 2015-08-27 MED ORDER — ONDANSETRON HCL 4 MG PO TABS: 4.0000 mg | ORAL_TABLET | Freq: Four times a day (QID) | ORAL | Status: DC | PRN

## 2015-08-27 MED ORDER — ACETAMINOPHEN 325 MG PO TABS
650.0000 mg | ORAL_TABLET | Freq: Once | ORAL | Status: AC
  Administered 2015-08-27: 650 mg via ORAL

## 2015-08-27 MED ORDER — METHYLPREDNISOLONE SODIUM SUCC 125 MG IJ SOLR
INTRAMUSCULAR | Status: AC
  Filled 2015-08-27: qty 2

## 2015-08-27 MED ORDER — ACETAMINOPHEN 325 MG PO TABS
ORAL_TABLET | ORAL | Status: AC
  Filled 2015-08-27: qty 2

## 2015-08-27 MED ORDER — NITROGLYCERIN IN D5W 200-5 MCG/ML-% IV SOLN
10.0000 ug/min | INTRAVENOUS | Status: DC
  Filled 2015-08-27: qty 250

## 2015-08-27 MED ORDER — ACETAMINOPHEN 650 MG RE SUPP: 650.0000 mg | Freq: Four times a day (QID) | RECTAL | Status: DC | PRN

## 2015-08-27 MED ORDER — VALACYCLOVIR HCL 500 MG PO TABS
500.0000 mg | ORAL_TABLET | Freq: Every day | ORAL | Status: DC
  Administered 2015-08-28: 500 mg via ORAL
  Filled 2015-08-27: qty 1

## 2015-08-27 MED ORDER — SODIUM CHLORIDE 0.9 % IV SOLN
250.0000 mL | Freq: Once | INTRAVENOUS | Status: AC
  Administered 2015-08-27: 250 mL via INTRAVENOUS

## 2015-08-27 MED ORDER — DOCUSATE SODIUM 100 MG PO CAPS
100.0000 mg | ORAL_CAPSULE | Freq: Two times a day (BID) | ORAL | Status: DC
  Administered 2015-08-27 – 2015-08-28 (×2): 100 mg via ORAL
  Filled 2015-08-27 (×2): qty 1

## 2015-08-27 MED ORDER — SODIUM CHLORIDE 0.9 % IJ SOLN
10.0000 mL | INTRAMUSCULAR | Status: DC | PRN
  Filled 2015-08-27: qty 10

## 2015-08-27 MED ORDER — ONDANSETRON HCL 8 MG PO TABS: 8.0000 mg | ORAL_TABLET | Freq: Three times a day (TID) | ORAL | Status: DC | PRN

## 2015-08-27 MED ORDER — SODIUM CHLORIDE 0.9 % IV SOLN
INTRAVENOUS | Status: DC
  Administered 2015-08-27: 23:00:00 via INTRAVENOUS

## 2015-08-27 MED ORDER — ACETAMINOPHEN 325 MG PO TABS
650.0000 mg | ORAL_TABLET | Freq: Four times a day (QID) | ORAL | Status: DC | PRN
  Administered 2015-08-27: 650 mg via ORAL
  Filled 2015-08-27: qty 2

## 2015-08-27 MED ORDER — IPRATROPIUM BROMIDE 0.02 % IN SOLN
0.5000 mg | Freq: Once | RESPIRATORY_TRACT | Status: AC
  Administered 2015-08-27: 0.5 mg via RESPIRATORY_TRACT

## 2015-08-27 MED ORDER — HEPARIN SOD (PORK) LOCK FLUSH 100 UNIT/ML IV SOLN
500.0000 [IU] | Freq: Every day | INTRAVENOUS | Status: DC | PRN
  Filled 2015-08-27: qty 5

## 2015-08-27 MED ORDER — FUROSEMIDE 10 MG/ML IJ SOLN
40.0000 mg | Freq: Once | INTRAMUSCULAR | Status: AC
  Administered 2015-08-27: 40 mg via INTRAMUSCULAR

## 2015-08-27 MED ORDER — FUROSEMIDE 10 MG/ML IJ SOLN: 20.0000 mg | Freq: Once | INTRAMUSCULAR | Status: DC

## 2015-08-27 MED ORDER — ALBUTEROL SULFATE (2.5 MG/3ML) 0.083% IN NEBU: 2.5000 mg | INHALATION_SOLUTION | Freq: Once | RESPIRATORY_TRACT | Status: DC

## 2015-08-27 MED ORDER — POMALIDOMIDE 4 MG PO CAPS
4.0000 mg | ORAL_CAPSULE | Freq: Every day | ORAL | Status: DC
  Administered 2015-08-27: 4 mg via ORAL

## 2015-08-27 MED ORDER — ONDANSETRON HCL 4 MG/2ML IJ SOLN: 4.0000 mg | Freq: Four times a day (QID) | INTRAMUSCULAR | Status: DC | PRN

## 2015-08-27 MED ORDER — POLYETHYLENE GLYCOL 3350 17 G PO PACK: 17.0000 g | PACK | Freq: Every day | ORAL | Status: DC | PRN

## 2015-08-27 MED ORDER — VITAMIN D 1000 UNITS PO TABS
1000.0000 [IU] | ORAL_TABLET | Freq: Every day | ORAL | Status: DC
  Administered 2015-08-28: 1000 [IU] via ORAL
  Filled 2015-08-27: qty 1

## 2015-08-27 MED ORDER — CALCIUM CARBONATE 1250 (500 CA) MG PO TABS
1.0000 | ORAL_TABLET | Freq: Every day | ORAL | Status: DC
Start: 2015-08-28 — End: 2015-08-28
  Administered 2015-08-28: 500 mg via ORAL
  Filled 2015-08-27: qty 1

## 2015-08-27 MED ORDER — MEPERIDINE HCL 25 MG/ML IJ SOLN
12.5000 mg | Freq: Once | INTRAMUSCULAR | Status: AC
  Administered 2015-08-27: 12.5 mg via INTRAVENOUS

## 2015-08-27 MED ORDER — ABACAVIR-DOLUTEGRAVIR-LAMIVUD 600-50-300 MG PO TABS
1.0000 | ORAL_TABLET | Freq: Every morning | ORAL | Status: DC
  Filled 2015-08-27: qty 1

## 2015-08-27 MED ORDER — DEXAMETHASONE SODIUM PHOSPHATE 4 MG/ML IJ SOLN
12.0000 mg | Freq: Once | INTRAMUSCULAR | Status: AC
  Administered 2015-08-27: 12 mg via INTRAVENOUS
  Filled 2015-08-27: qty 3

## 2015-08-27 NOTE — ED Notes (Signed)
Pt. Is Alert and oriented

## 2015-08-27 NOTE — Progress Notes (Signed)
49 year old with a history of multiple myeloma and HIV, patient of Dr. Marin Olp, presented to [facility] with complaints of shortness of breath. It seems the patient had received new chemotherapy yesterday. She gone in today for blood transfusion. After receiving her second blood transfusion or during the transfusion itself, patient began to have shortness of breath. She required BiPAP. She was given sorry Medrol, DuoNeb, Lasix. Her breathing did improve. Patient currently on nasal cannula and maintaining her oxygen saturations. Patient was also noted to have an acute kidney injury, creatinine 2.09, is approximately 1.4 at baseline. Patient accepted to [facility], telemetry, observation.  Time spent: 10 minutes  Keelee Yankey D.O. [facility] Pager [phone]  If 7PM-7AM, please contact night-coverage [email or web address] Password [email or web address] 08/27/2015, 4:59 PM

## 2015-08-27 NOTE — Patient Instructions (Signed)
Blood Transfusion, Care After °Refer to this sheet in the next few weeks. These instructions provide you with information about caring for yourself after your procedure. Your health care provider may also give you more specific instructions. Your treatment has been planned according to current medical practices, but problems sometimes occur. Call your health care provider if you have any problems or questions after your procedure. °WHAT TO EXPECT AFTER THE PROCEDURE °After your procedure, it is common to have: °· Bruising and soreness at the IV site. °· Chills or fever. °· Headache. °HOME CARE INSTRUCTIONS °· Take medicines only as directed by your health care provider. Ask your health care provider if you can take an over-the-counter pain reliever in case you have a fever or headache a day or two after your transfusion. °· Return to your normal activities as directed by your health care provider. °SEEK MEDICAL CARE IF:  °· You develop redness or irritation at your IV site. °· You have persistent fever, chills, or headache. °· Your urine is darker than normal. °· Your urine turns pink, red, or brown.   °· The white part of your eye turns yellow (jaundice).   °· You feel weak after doing your normal activities.   °SEEK IMMEDIATE MEDICAL CARE IF:  °· You have trouble breathing. °· You have fever and chills along with: °¨ Anxiety. °¨ Chest or back pain. °¨ Flushed skin. °¨ Clammy skin. °¨ A rapid heartbeat. °¨ Nausea. °  °This information is not intended to replace advice given to you by your health care provider. Make sure you discuss any questions you have with your health care provider. °  °Document Released: 10/17/2014 Document Reviewed: 10/17/2014 °Elsevier Interactive Patient Education ©2016 Elsevier Inc. ° °

## 2015-08-27 NOTE — ED Notes (Signed)
MD at bedside. 

## 2015-08-27 NOTE — Progress Notes (Signed)
During my medication history interview the patient related to me that the night before her transfusion, she had a fever of 102 which she treated with Tylenol and over the past few days she has noticed throat congestion with clear sputum. Also, tonight she had blood-tinged urine and it was "foamy".   Romeo Rabon, PharmD, pager [phone]. 08/27/2015,8:17 PM.

## 2015-08-27 NOTE — ED Notes (Signed)
[facility] is aware of room 1424 at [facility] for transfer

## 2015-08-27 NOTE — ED Notes (Signed)
Pt. Has been made aware of the risk not going via [facility] to the [facility]. She is still refusing to go via Ambulance service.

## 2015-08-27 NOTE — Progress Notes (Signed)
Patient complained of chills and shaking. Temperature elevated at 99.2. Notified Dr Marin Olp; patient suddenly became very short of breath with rapid respirations.  Patient sent to ED due to respiratory distress. ED staff at bedside during transport.

## 2015-08-27 NOTE — ED Notes (Signed)
Pt in from [facility] upstairs c/o SOB onset today, recent dx of pneumonia. Pt on NRB sat 97%. Port accessed with NS running. Pt is alert, interactive, answering questions appropriately.

## 2015-08-27 NOTE — ED Notes (Signed)
Pt. Does not want to ride with [facility] to [facility].  Pt. Wants to ride with her cousin to [facility].

## 2015-08-27 NOTE — ED Notes (Signed)
Only one set of blood cultures obtained on Pt.   Reported to Dr. Regenia Skeeter EDP.

## 2015-08-27 NOTE — ED Notes (Signed)
Pt. Is breathing much better with BiPap No nausea or vomiting noted.  Dr. Regenia Skeeter has been at Pt. Bedside to see about Pt. Several times and report to Pt. Findings.

## 2015-08-27 NOTE — H&P (Signed)
[facility] History and Physical  Jennifer Boyle XBL:[id number] DOB: December 27, 1965 DOA: 08/27/2015  Referring physician: Patient was transferred from [facility] [place]. PCP: Jonathon Bellows, MD  Specialists: Dr. Marin Olp. Oncologist.  Chief Complaint: Shortness of breath.  HPI: Jennifer Boyle is a 49 y.o. female with history of multiple myeloma, HIV was recently restarted on oral chemotherapy 2 weeks ago and has received 2 doses of IV chemotherapy last one yesterday had blood transfusion today for anemia. Following the second unit of PRBC transfusion patient started developing shortness of breath. Patient states since last night patient had been having subjective feeling of fever and chills with temperatures around 101F. Patient states prior to her transfusion she was febrile and had received Tylenol prior to transfusion. Following the transfusion patient started experiencing shortness of breath for which patient was placed on BiPAP. After our patient shortness of breath resolved and BiPAP was weaned and presently on nasal cannula. Patient denies any chest pain or productive cough. Patient's labs show acute renal failure with thrombocytopenia and anemia. Hemoglobin has increased after transfusion. After reaching hospital patient Jennifer Boyle immaturity with mild flank pain. Patient is not in distress. Patient has been admitted for further management of transfusion reaction and acute renal failure. Patient denies any nausea vomiting diarrhea headache or any visual symptoms of focal deficits.   Review of Systems: As presented in the history of presenting illness, rest negative.  Past Medical History  Diagnosis Date  . Pneumonia 10/2002; 2010    Required INTUBATION for streptococcal pneumonia/septicemia (10/2002)  . HIV disease ([place]) DX: 01/2002  . Fibroid uterus   . 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 Peripheral neuropathy (HCC)     resolved.   . Multiple myeloma ([place]) 01/2013    01/11/2013 cytogenetics showed 59, XX, +2, +4, +5, +7, add(7)(p22), +, +9, +11, der(11) t(1;11)(q11; q24), +12, +15, -16, +17, +19, +20, +mar1, +mar2[cp6] / 39, XX [17].   Myeloma FISH showed presence of +11, and +17.   . Multiple myeloma [facility])    Past Surgical History  Procedure Laterality Date  . No past surgeries    . Bone marrow transplant     Social History:  reports that she has never smoked. She has never used smokeless tobacco. She reports that she does not drink alcohol or use illicit drugs. Where does patient live home. Can patient participate in ADLs? Yes.  Allergies  Allergen Reactions  . Aspirin     Upset stomach   . Lactose Intolerance (Gi) Diarrhea  . Efavirenz Hives and Rash    (brand name sustiva)    Family History:  Family History  Problem Relation Age of Onset  . Hypertension Mother       Prior to Admission medications   Medication Sig Start Date End Date Taking? Authorizing Provider  Abacavir-Dolutegravir-Lamivud 300-76-226 MG TABS Take 1 tablet by mouth every morning. 12/30/14  Yes Truman Hayward, MD  Ascorbic Acid (VITAMIN C) 1000 MG tablet Take 1,000 mg by mouth daily.   Yes Historical Provider, MD  calcium carbonate (OS-CAL) 600 MG TABS tablet Take 600 mg by mouth daily with breakfast.   Yes Historical Provider, MD  cholecalciferol (VITAMIN  D) 1000 UNITS tablet Take 1 tablet (1,000 Units total) by mouth daily. 07/12/13  Yes Heath Lark, MD  docusate sodium (COLACE) 100 MG capsule Take 100 mg by mouth 2 (two) times daily.   Yes Historical Provider, MD  ketorolac (TORADOL) 10 MG tablet Take 1 tablet (10 mg total) by mouth every 6 (six) hours as needed. Patient taking  differently: Take 10 mg by mouth every 6 (six) hours as needed (pain).  07/30/15  Yes Volanda Napoleon, MD  lidocaine-prilocaine (EMLA) cream Apply topically as needed. Apply to portacath one hour before chemo access or blood draw. 11/04/13  Yes Heath Lark, MD  ondansetron (ZOFRAN) 8 MG tablet Take 8 mg by mouth every 8 (eight) hours as needed for nausea or vomiting.  06/04/14  Yes Historical Provider, MD  polyethylene glycol (MIRALAX / GLYCOLAX) packet Take 17 g by mouth daily as needed (constipation).    Yes Historical Provider, MD  pomalidomide (POMALYST) 4 MG capsule Take 1 capsule (4 mg total) by mouth daily. Take with water on days 1-21. Repeat every 28 days. [id number] 08/27/15  Yes Volanda Napoleon, MD  TraMADol HCl 50 MG TBDP Take 50 mg by mouth every 6 (six) hours as needed (pain).    Yes Historical Provider, MD  valACYclovir (VALTREX) 500 MG tablet TAKE 1 TABLET BY MOUTH EVERY DAY 06/01/15  Yes Volanda Napoleon, MD    Physical Exam: Filed Vitals:   08/27/15 1630 08/27/15 1700 08/27/15 1730 08/27/15 1900  BP: 153/89 124/96 165/92 167/89  Pulse: 94 89 86 73  Temp:    98.3 F (36.8 C)  TempSrc:    Oral  Resp: _0 Height:    _1  (1.6 m)  Weight:    75.7 kg (166 lb 14.2 oz)  SpO2: 98% 98% 100% 97%     General:  Moderately built and nourished.  Eyes: Anicteric no pallor.  ENT: No discharge from the ears eyes nose and mouth.  Neck: No mass felt.  Cardiovascular: S1-S2 heard.  Respiratory: No rhonchi or crepitations.  Abdomen: Soft nontender bowel sounds present.  Skin: No rash.  Musculoskeletal: Mild edema.  Psychiatric: Appears normal.  Neurologic: Alert and oriented to time place and person. Moves all extremities.  Labs on Admission:  Basic Metabolic Panel:  Recent Labs Lab 08/26/15 1406 08/27/15 1525  NA 147* 135  K 4.0 4.0  CL 109* 109  CO2 22 17*  GLUCOSE 99 113*  BUN 26* 36*  CREATININE 1.4* 2.09*  CALCIUM 9.3 8.3*   Liver Function  Tests:  Recent Labs Lab 08/26/15 1406  AST 23  ALT 21  ALKPHOS 91*  BILITOT 0.60  PROT 7.6  ALBUMIN 2.4*   No results for input(s): LIPASE, AMYLASE in the last 168 hours. No results for input(s): AMMONIA in the last 168 hours. CBC:  Recent Labs Lab 08/26/15 1407 08/27/15 1525  WBC 8.8 7.8  NEUTROABS 1.8 2.5  HGB 8.8* 11.0*  HCT 27.3* 32.4*  MCV 101 94.2  PLT 115* 87*   Cardiac Enzymes:  Recent Labs Lab 08/27/15 1525  TROPONINI 0.04*    BNP (last 3 results)  Recent Labs  08/27/15 1525  BNP 305.7*    ProBNP (last 3 results) No results for input(s): PROBNP in the last 8760 hours.  CBG: No results for input(s): GLUCAP in the last 168 hours.  Radiological Exams on Admission: Dg Chest Port 1 View  08/27/2015  CLINICAL DATA:  Acute dyspnea, recent transfusion  EXAM: PORTABLE CHEST - 1 VIEW COMPARISON:  08/12/2015 FINDINGS: Right chest wall port is again seen in stable position. The cardiac shadow is within normal limits. The lungs are well aerated with mild interstitial changes stable from the prior exam. No focal infiltrate or sizable effusion is seen. No acute bony abnormality is noted. IMPRESSION: No active disease. Electronically Signed   By: Inez Catalina M.D.   On: 08/27/2015 15:18    EKG: Independently reviewed. Normal sinus rhythm.  Assessment/Plan Principal Problem:   Blood transfusion reaction Active Problems:   Human immunodeficiency virus (HIV) disease (HCC)   Multiple myeloma (HCC)   Acute kidney injury ([place])   Transfusion reaction   1. Blood transfusion reaction - patient's symptoms are concerning for blood transfusion reaction. I have discussed with on-call hematologist Dr. Lindi Adie. At this time Dr. Payton Mccallum is advised to get transfusion reaction panel for which I have discussed the blood bank. Dr. Lindi Adie has also advised to give Decadron 12 mg 1 dose. We'll be repeating metabolic panel and CBC in another few hours. Closely observe at this time.  In addition we'll be getting blood cultures urine cultures. Check influenza PCR. 2. Acute renal failure with hematuria - probably secondary to transfusion reaction. Since patient also has multiple myeloma closely follow intake output metabolic panel. I have ordered a renal ultrasound check FENa. Urine does not show any casts. For now patient will be on gentle hydration but closely monitor respiratory status. Patient's medication list states patient is on Toradol which patient states she has not taken for last 1 year. Denies taking any NSAIDs or aspirin. 3. Mildly elevated troponin - check 2-D echo and closely monitor respiratory status. No aspirin secondary to severe thrombocytopenia. 4. Anemia and thrombocytopenia - probably from multiple myeloma and chemotherapy and transfusion reaction. Closely follow CBC. 5. Multiple myeloma - per oncology. 6. HIV - last CD4 count last year was 300. Patient states she is compliant with her medications. Check CD4 counts and continue home medications.  I have reviewed patient's old charts and labs. Personally reviewed patient's EKG and chest x-ray. I have discussed with on-call oncologist.   DVT Prophylaxis SCDs.  Code Status: Full code.  Family Communication: Discussed with patient.  Disposition Plan: Admit to inpatient.    Ngozi Alvidrez N. [facility] Pager [phone].  If 7PM-7AM, please contact night-coverage [email or web address] Password [facility] 08/27/2015, 9:17 PM

## 2015-08-27 NOTE — ED Provider Notes (Signed)
CSN: [id number]     Arrival date & time 08/27/15  1449 History   First MD Initiated Contact with Patient 08/27/15 1455     Chief Complaint  Patient presents with  . Shortness of Breath     (Consider location/radiation/quality/duration/timing/severity/associated sxs/prior Treatment) HPI  49 year old female with a history of HIV and multiple myeloma currently receiving chemotherapy presents from the [facility] with acute dyspnea. Patient was receiving 2 units of blood for symptomatic anemia with a hemoglobin around 8. During the second unit she developed acute chills, trouble breathing, hypoxia, and wheezing. She was given a DuoNeb, Solu-Medrol, 40 mg Lasix, and placed on oxygen. She feels somewhat better but still has respiratory distress. Patient also notes that she recently got over pneumonia, finished her antibiotics 2 weeks ago. Did develop a fever of 101 last night. Last chemotherapy was yesterday. Patient was given Tylenol this morning before her blood transfusion as well as shortly after the transfusion symptoms started. Endorses a mild cough. Initial BP in the ED is 197/100.  Past Medical History  Diagnosis Date  . Pneumonia 10/2002; 2010    Required INTUBATION for streptococcal pneumonia/septicemia (10/2002)  . HIV disease ([place]) DX: 01/2002  . Fibroid uterus   . 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 Peripheral neuropathy (HCC)     resolved.   . Multiple myeloma ([place]) 01/2013    01/11/2013 cytogenetics showed 59, XX, +2, +4, +5, +7, add(7)(p22), +, +9, +11, der(11) t(1;11)(q11; q24), +12, +15, -16, +17, +19, +20, +mar1, +mar2[cp6] / 37, XX [17].   Myeloma FISH showed presence of +11, and +17.    . Multiple myeloma [facility])    Past Surgical History  Procedure Laterality Date  . No past surgeries    . Bone marrow transplant     Family History  Problem Relation Age of Onset  . Hypertension Mother    Social History  Substance Use Topics  . Smoking status: Never Smoker   . Smokeless tobacco: Never Used     Comment: never used tobacco  . Alcohol Use: No   OB History    No data available     Review of Systems  Constitutional: Positive for fever.  Respiratory: Positive for cough and shortness of breath.   Cardiovascular: Negative for chest pain and leg swelling.  Gastrointestinal: Negative for vomiting.  Genitourinary: Negative for dysuria.  All other systems reviewed and are negative.     Allergies  Aspirin and Efavirenz  Home Medications   Prior to Admission medications   Medication Sig Start Date End Date Taking? Authorizing Provider  Abacavir-Dolutegravir-Lamivud 765-46-503 MG TABS Take 1 tablet by mouth every morning. 12/30/14   Truman Hayward, MD  calcium carbonate (OS-CAL) 600 MG TABS tablet Take 600 mg by mouth daily with breakfast.    Historical Provider, MD  cholecalciferol (VITAMIN D) 1000 UNITS tablet Take 1 tablet (1,000 Units total) by mouth daily. 07/12/13   Heath Lark, MD  docusate sodium (COLACE) 100 MG capsule Take 100 mg by mouth 2 (two) times daily.    Historical Provider, MD  ketorolac (TORADOL) 10 MG tablet Take 1 tablet (10 mg total) by mouth every 6 (six) hours as needed.  07/30/15   Volanda Napoleon, MD  lidocaine-prilocaine (EMLA) cream Apply topically as needed. Apply to portacath one hour before chemo access or blood draw. 11/04/13   Heath Lark, MD  ondansetron (ZOFRAN) 8 MG tablet Take 8 mg by mouth every 8 (eight) hours as needed.  06/04/14   Historical Provider, MD  polyethylene glycol (MIRALAX / GLYCOLAX) packet Take 17 g by mouth as needed.     Historical Provider, MD  pomalidomide (POMALYST) 4 MG capsule Take 1 capsule (4 mg total) by  mouth daily. Take with water on days 1-21. Repeat every 28 days. DEYC#[id number] 08/27/15   Volanda Napoleon, MD  TraMADol HCl 50 MG TBDP Take by mouth every 6 (six) hours as needed.    Historical Provider, MD  valACYclovir (VALTREX) 500 MG tablet TAKE 1 TABLET BY MOUTH EVERY DAY 06/01/15   Volanda Napoleon, MD   LMP 08/05/2015 Physical Exam  Constitutional: She is oriented to person, place, and time. She appears well-developed and well-nourished. She appears distressed.  HENT:  Head: Normocephalic and atraumatic.  Right Ear: External ear normal.  Left Ear: External ear normal.  Nose: Nose normal.  Eyes: Right eye exhibits no discharge. Left eye exhibits no discharge.  Cardiovascular: Normal rate, regular rhythm and normal heart sounds.   Pulmonary/Chest: Tachypnea noted. She has wheezes (diffuse exipatory wheezes).  Abdominal: Soft. There is no tenderness.  Musculoskeletal: She exhibits no edema.  Neurological: She is alert and oriented to person, place, and time.  Skin: Skin is warm. She is diaphoretic.  Nursing note and vitals reviewed.   ED Course  Procedures (including critical care time) Labs Review Labs Reviewed  BASIC METABOLIC PANEL - Abnormal; Notable for the following:    CO2 17 (*)    Glucose, Bld 113 (*)    BUN 36 (*)    Creatinine, Ser 2.09 (*)    Calcium 8.3 (*)    GFR calc non Af Amer 26 (*)    GFR calc Af Amer 30 (*)    All other components within normal limits  CBC WITH DIFFERENTIAL/PLATELET - Abnormal; Notable for the following:    RBC 3.44 (*)    Hemoglobin 11.0 (*)    HCT 32.4 (*)    RDW 16.9 (*)    Platelets 87 (*)    Lymphs Abs 4.6 (*)    All other components within normal limits  TROPONIN I - Abnormal; Notable for the following:    Troponin I 0.04 (*)    All other components within normal limits  BRAIN NATRIURETIC PEPTIDE - Abnormal; Notable for the following:    B Natriuretic Peptide 305.7 (*)    All other components within normal limits  CULTURE,  BLOOD (ROUTINE X 2)  CULTURE, BLOOD (ROUTINE X 2)  URINALYSIS, ROUTINE W REFLEX MICROSCOPIC (NOT AT [facility])  I-STAT CG4 LACTIC ACID, ED    Imaging Review Dg Chest Port 1 View  08/27/2015  CLINICAL DATA:  Acute dyspnea, recent transfusion EXAM: PORTABLE CHEST - 1 VIEW COMPARISON:  08/12/2015 FINDINGS: Right chest wall port is again seen in stable position. The cardiac shadow is within normal limits. The lungs are well aerated with mild interstitial changes stable from the prior exam. No focal infiltrate or sizable effusion is seen. No acute bony abnormality is noted. IMPRESSION: No active disease. Electronically Signed   By: Inez Catalina M.D.   On: 08/27/2015 15:18   I have personally reviewed and evaluated these images and lab results as part of  my medical decision-making.   EKG Interpretation   Date/Time:  Thursday August 27 2015 15:01:11 EST Ventricular Rate:  128 PR Interval:  142 QRS Duration: 64 QT Interval:  280 QTC Calculation: 408 R Axis:   41 Text Interpretation:  Sinus tachycardia with Fusion complexes Possible  Left atrial enlargement Nonspecific T wave abnormality Abnormal ECG  Interpretation limited secondary to artifact no significant change since  2004 Confirmed by Amarilis Belflower  MD, Jemmie Rhinehart ([id number]) on 08/27/2015 3:19:07 PM      MDM   Final diagnoses:  Blood transfusion reaction, initial encounter  Acute kidney injury ([place])    Patient's presentation with acute respirator stress seems to be from her blood transfusion reaction. She was quite hypertensive. She was started on BiPAP and was about to be started on nitroglycerin however her blood pressure had already trended down from 340 systolic to 684 systolic. She is breathing much easier and has been taken off of BiPAP. She currently does not have a fever and she did have chills after the blood change in vision but this seems more of a reaction from that than an actual infection. No pneumonia. Likely her shortness of breath  was from transient pulmonary edema from the blood transfusion. Her renal function has worsened since 1 week ago, could be chemotherapy related as she was just started on a new chemotherapy. Either way she will require admission. Discussed with the hospitalist, Dr. Ree Kida. Patient to be transferred and admitted [facility]. She does not want to go by ambulance. Patient wants to go by POV, after convincing she agrees to go by [employer]. Admitted to [place], MD 08/27/15 [phone]

## 2015-08-28 ENCOUNTER — Inpatient Hospital Stay (HOSPITAL_COMMUNITY): Payer: Commercial Managed Care - PPO

## 2015-08-28 DIAGNOSIS — B379 Candidiasis, unspecified: Secondary | ICD-10-CM

## 2015-08-28 DIAGNOSIS — R0602 Shortness of breath: Secondary | ICD-10-CM

## 2015-08-28 DIAGNOSIS — C9002 Multiple myeloma in relapse: Secondary | ICD-10-CM

## 2015-08-28 DIAGNOSIS — T8092XD Unspecified transfusion reaction, subsequent encounter: Secondary | ICD-10-CM

## 2015-08-28 DIAGNOSIS — T8092XA Unspecified transfusion reaction, initial encounter: Secondary | ICD-10-CM

## 2015-08-28 DIAGNOSIS — N179 Acute kidney failure, unspecified: Secondary | ICD-10-CM

## 2015-08-28 DIAGNOSIS — B20 Human immunodeficiency virus [HIV] disease: Secondary | ICD-10-CM

## 2015-08-28 LAB — URINALYSIS, ROUTINE W REFLEX MICROSCOPIC
BILIRUBIN URINE: NEGATIVE
Glucose, UA: NEGATIVE mg/dL
KETONES UR: NEGATIVE mg/dL
NITRITE: NEGATIVE
Protein, ur: >300 mg/dL — AB
Specific Gravity, Urine: 1.013 (ref 1.005–1.030)
pH: 7 (ref 5.0–8.0)

## 2015-08-28 LAB — TYPE AND SCREEN
ABO/RH(D): A POS
Antibody Screen: NEGATIVE
UNIT DIVISION: 0
Unit division: 0

## 2015-08-28 LAB — CBC WITH DIFFERENTIAL/PLATELET
BASOS ABS: 0 10*3/uL (ref 0.0–0.1)
BASOS PCT: 0 %
EOS ABS: 0.1 10*3/uL (ref 0.0–0.7)
Eosinophils Relative: 1 %
HEMATOCRIT: 34.2 % — AB (ref 36.0–46.0)
HEMOGLOBIN: 11.4 g/dL — AB (ref 12.0–15.0)
LYMPHS PCT: 50 %
Lymphs Abs: 2.7 10*3/uL (ref 0.7–4.0)
MCH: 31.6 pg (ref 26.0–34.0)
MCHC: 33.3 g/dL (ref 30.0–36.0)
MCV: 94.7 fL (ref 78.0–100.0)
MONOS PCT: 6 %
Monocytes Absolute: 0.3 10*3/uL (ref 0.1–1.0)
NEUTROS ABS: 2.3 10*3/uL (ref 1.7–7.7)
NEUTROS PCT: 43 %
Platelets: 72 10*3/uL — ABNORMAL LOW (ref 150–400)
RBC: 3.61 MIL/uL — ABNORMAL LOW (ref 3.87–5.11)
RDW: 18.1 % — ABNORMAL HIGH (ref 11.5–15.5)
WBC: 5.4 10*3/uL (ref 4.0–10.5)

## 2015-08-28 LAB — T-HELPER CELLS (CD4) COUNT (NOT AT ARMC): CD4 T CELL ABS: 20 /uL — AB (ref 400–2700)

## 2015-08-28 LAB — COMPREHENSIVE METABOLIC PANEL
ALBUMIN: 2.7 g/dL — AB (ref 3.5–5.0)
ALK PHOS: 112 U/L (ref 38–126)
ALT: 61 U/L — ABNORMAL HIGH (ref 14–54)
ANION GAP: 7 (ref 5–15)
AST: 58 U/L — ABNORMAL HIGH (ref 15–41)
BUN: 38 mg/dL — ABNORMAL HIGH (ref 6–20)
CALCIUM: 8.4 mg/dL — AB (ref 8.9–10.3)
CO2: 18 mmol/L — AB (ref 22–32)
Chloride: 116 mmol/L — ABNORMAL HIGH (ref 101–111)
Creatinine, Ser: 1.71 mg/dL — ABNORMAL HIGH (ref 0.44–1.00)
GFR calc non Af Amer: 34 mL/min — ABNORMAL LOW (ref >60)
GFR, EST AFRICAN AMERICAN: 39 mL/min — AB (ref >60)
GLUCOSE: 155 mg/dL — AB (ref 65–99)
POTASSIUM: 4.7 mmol/L (ref 3.5–5.1)
SODIUM: 141 mmol/L (ref 135–145)
Total Bilirubin: 0.4 mg/dL (ref 0.3–1.2)
Total Protein: 6.8 g/dL (ref 6.5–8.1)

## 2015-08-28 LAB — INFLUENZA PANEL BY PCR (TYPE A & B)
H1N1 flu by pcr: NOT DETECTED
INFLAPCR: NEGATIVE
INFLBPCR: NEGATIVE

## 2015-08-28 LAB — CREATININE, URINE, RANDOM: Creatinine, Urine: 55.28 mg/dL

## 2015-08-28 LAB — SODIUM, URINE, RANDOM: Sodium, Ur: 21 mmol/L

## 2015-08-28 LAB — URINE MICROSCOPIC-ADD ON

## 2015-08-28 LAB — TROPONIN I

## 2015-08-28 MED ORDER — SODIUM CHLORIDE 0.9 % IJ SOLN
10.0000 mL | Freq: Two times a day (BID) | INTRAMUSCULAR | Status: DC
  Administered 2015-08-28: 10 mL

## 2015-08-28 MED ORDER — MONTELUKAST SODIUM 10 MG PO TABS: 10.0000 mg | ORAL_TABLET | Freq: Every day | ORAL | Status: DC

## 2015-08-28 MED ORDER — DOLUTEGRAVIR SODIUM 50 MG PO TABS
50.0000 mg | ORAL_TABLET | Freq: Every day | ORAL | Status: DC
  Filled 2015-08-28: qty 1

## 2015-08-28 MED ORDER — ABACAVIR SULFATE 300 MG PO TABS
600.0000 mg | ORAL_TABLET | Freq: Every day | ORAL | Status: DC
  Filled 2015-08-28: qty 2

## 2015-08-28 MED ORDER — SODIUM CHLORIDE 0.9 % IJ SOLN
10.0000 mL | INTRAMUSCULAR | Status: DC | PRN
  Administered 2015-08-28: 10 mL
  Filled 2015-08-28: qty 40

## 2015-08-28 MED ORDER — FUROSEMIDE 10 MG/ML IJ SOLN
20.0000 mg | Freq: Once | INTRAMUSCULAR | Status: AC
  Administered 2015-08-28: 20 mg via INTRAVENOUS
  Filled 2015-08-28: qty 2

## 2015-08-28 MED ORDER — LAMIVUDINE 150 MG PO TABS
300.0000 mg | ORAL_TABLET | Freq: Every day | ORAL | Status: DC
  Filled 2015-08-28: qty 2

## 2015-08-28 MED ORDER — HEPARIN SOD (PORK) LOCK FLUSH 100 UNIT/ML IV SOLN
500.0000 [IU] | INTRAVENOUS | Status: AC | PRN
Start: 2015-08-28 — End: 2015-08-28
  Administered 2015-08-28: 500 [IU]

## 2015-08-28 MED ORDER — FLUCONAZOLE 100 MG PO TABS
200.0000 mg | ORAL_TABLET | Freq: Every day | ORAL | Status: DC
Start: 2015-08-28 — End: 2015-08-28
  Administered 2015-08-28: 200 mg via ORAL
  Filled 2015-08-28: qty 2

## 2015-08-28 MED ORDER — LORATADINE 10 MG PO TABS
10.0000 mg | ORAL_TABLET | Freq: Every day | ORAL | Status: DC
Start: 2015-08-28 — End: 2015-08-28
  Administered 2015-08-28: 10 mg via ORAL
  Filled 2015-08-28: qty 1

## 2015-08-28 MED ORDER — LAMIVUDINE 150 MG PO TABS
150.0000 mg | ORAL_TABLET | Freq: Every day | ORAL | Status: DC
  Filled 2015-08-28: qty 1

## 2015-08-28 NOTE — Progress Notes (Signed)
Pt seen and examined, admitted this am by Dr.Kakrakandy, pls see H&P for details 49/F with Relapsed IgA Myeloma, HIV; currently on Pomalidomide  She was started on Kyprolis- first dose 11/9 and second dose 11/17 followed by 2units PRBC Subsequently developed Resp distress and was admitted, given IV lasix Clinically suspect Transfusion reaction, volume overload post transfusion could have contibuted Improving, but had an episode of dyspnea this am while walking to bathroom, Vitals stable Lasix x1 again, Repeat UA Appreciate Dr.Ennevers input Home later today or in am  Domenic Polite, MD

## 2015-08-28 NOTE — Progress Notes (Signed)
Patient ambulated around unit on RA, oxygen saturation maintained 95-98%. Will report to provider

## 2015-08-28 NOTE — Consult Note (Signed)
Referral MD  Reason for Referral: IgA Myeloma-Relapsed; Dyspnea Post Chemotherapy and Blood Transfusion.   Chief Complaint  Patient presents with  . Shortness of Breath  : I got very short of breath after my blood yesterday.  HPI: Jennifer Boyle is well-known to me. She is a 49 year old African woman. 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 She has been on therapy for the relapse. Her myeloma values have been worsening. She is currently on Pomalidomide which is an oral medication. She was then started on Kyprolis. She had her first dose on November 9. She had no obvious problems with this. We increased her dose a little bit yesterday. She also received 2 units of blood. She did not get any diuretics between the units of blood. After the second unit was complete, she began to have dyspnea. She became somewhat hypoxic. She was treated in the office and then sent to the emergency room. She now has been admitted. Her chest x-ray did not show any obvious infiltrate. There is no obvious pulmonary edema. She did get some Lasix when she became dyspneic.  She was admitted to [facility]. Her labs show her creatinine to be 1.71. He was able little bit yesterday. Her liver function tests are slightly elevated. Her hemoglobin was 11.4. Platelets were 72. She did have a elevated beta natruretic peptide yesterday.  She said that her urine is bloody. A urine culture has been sent. Blood cultures have been sent.  She has a little bit of thrush. She is on HIV medications.  She looks a whole lot better this morning. She is not dyspneic. She does complain of the thrush. She's had no obvious fever. She's had no nausea or vomiting.  She's does feel as if she has some mucus which she is having a hard time bringing up.   Past Medical History  Diagnosis Date  . Pneumonia 10/2002; 2010    Required INTUBATION for streptococcal pneumonia/septicemia (10/2002)  . HIV disease ([place]) DX: 01/2002   . Fibroid uterus   . 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 Peripheral neuropathy (HCC)     resolved.   . Multiple myeloma ([place]) 01/2013    01/11/2013 cytogenetics showed 59, XX, +2, +4, +5, +7, add(7)(p22), +, +9, +11, der(11) t(1;11)(q11; q24), +12, +15, -16, +17, +19, +20, +mar1, +mar2[cp6] / 41, XX [17].   Myeloma FISH showed presence of +11, and +17.   . Multiple myeloma ([place])   :  Past Surgical History  Procedure Laterality Date  . No past surgeries    . Bone marrow transplant    :   Current facility-administered medications:  .  0.9 %  sodium chloride infusion, , Intravenous, Continuous, Rise Patience, MD, Last Rate: 50 mL/hr at 08/27/15 2313 .  abacavir (ZIAGEN) tablet 600 mg, 600 mg, Oral, Daily **AND** dolutegravir (TIVICAY) tablet 50 mg, 50 mg, Oral, Daily **AND** lamiVUDine (EPIVIR) tablet 300 mg, 300 mg, Oral, Daily, Rise Patience, MD .  acetaminophen (TYLENOL) tablet 650 mg, 650 mg, Oral, Q6H PRN, 650 mg at 08/27/15 2310 **OR** acetaminophen (TYLENOL) suppository 650 mg, 650 mg, Rectal, Q6H PRN, Rise Patience, MD .  calcium  carbonate (OS-CAL - dosed in mg of elemental calcium) tablet 500 mg of elemental calcium, 1 tablet, Oral, Q breakfast, Rise Patience, MD .  cholecalciferol (VITAMIN D) tablet 1,000 Units, 1,000 Units, Oral, Daily, Rise Patience, MD .  docusate sodium (COLACE) capsule 100 mg, 100 mg, Oral, BID, Rise Patience, MD, 100 mg at 08/27/15 2310 .  fluconazole (DIFLUCAN) tablet 200 mg, 200 mg, Oral, Daily, Volanda Napoleon, MD .  hydrALAZINE (APRESOLINE) injection 10 mg, 10 mg, Intravenous, Q4H PRN, Rise Patience, MD, 10 mg at 08/28/15  0538 .  loratadine (CLARITIN) tablet 10 mg, 10 mg, Oral, Daily, Volanda Napoleon, MD .  montelukast (SINGULAIR) tablet 10 mg, 10 mg, Oral, QHS, Volanda Napoleon, MD .  ondansetron (ZOFRAN) tablet 4 mg, 4 mg, Oral, Q6H PRN **OR** ondansetron (ZOFRAN) injection 4 mg, 4 mg, Intravenous, Q6H PRN, Rise Patience, MD .  ondansetron (ZOFRAN) tablet 8 mg, 8 mg, Oral, Q8H PRN, Rise Patience, MD .  polyethylene glycol (MIRALAX / GLYCOLAX) packet 17 g, 17 g, Oral, Daily PRN, Rise Patience, MD .  pomalidomide (POMALYST) capsule 4 mg, 4 mg, Oral, Daily, Rise Patience, MD, 4 mg at 08/27/15 2313 .  sodium chloride 0.9 % injection 10-40 mL, 10-40 mL, Intracatheter, Q12H, Rise Patience, MD, 10 mL at 08/28/15 0631 .  sodium chloride 0.9 % injection 10-40 mL, 10-40 mL, Intracatheter, PRN, Rise Patience, MD .  traMADol Veatrice Bourbon) tablet 50 mg, 50 mg, Oral, Q6H PRN, Rise Patience, MD .  valACYclovir (VALTREX) tablet 500 mg, 500 mg, Oral, Daily, Rise Patience, MD:  . abacavir  600 mg Oral Daily   And  . dolutegravir  50 mg Oral Daily   And  . lamiVUDine  300 mg Oral Daily  . calcium carbonate  1 tablet Oral Q breakfast  . cholecalciferol  1,000 Units Oral Daily  . docusate sodium  100 mg Oral BID  . fluconazole  200 mg Oral Daily  . loratadine  10 mg Oral Daily  . montelukast  10 mg Oral QHS  . pomalidomide  4 mg Oral Daily  . sodium chloride  10-40 mL Intracatheter Q12H  . valACYclovir  500 mg Oral Daily  :  Allergies  Allergen Reactions  . Aspirin     Upset stomach   . Lactose Intolerance (Gi) Diarrhea  . Efavirenz Hives and Rash    (brand name sustiva)  :  Family History  Problem Relation Age of Onset  . Hypertension Mother   :  Social History   Social History  . Marital Status: Single    Spouse Name: N/A  . Number of Children: 0  . Years of Education: N/A   Occupational History  .      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    Social History  Main Topics  . Smoking status: Never Smoker   . Smokeless tobacco: Never Used     Comment: never used tobacco  . Alcohol Use: No  . Drug Use: No  . Sexual Activity: Not Currently   Other Topics Concern  . Not on file   Social History Narrative   The patient is single. The patient has no children.   The patient is originally from [place] then moved to the [place] approximately 25 years ago.   The patient works as a Multimedia programmer Investment banker, corporate).   Patient has never smoked. The patient does not use alcohol.  The patient has never used smokeless tobacco. The patient denies IV drug abuse.  :  Pertinent items are noted in HPI.  Exam: Patient Vitals for the past 24 hrs:  BP Temp Temp src Pulse Resp SpO2 Height Weight  08/28/15 0639 (!) 143/72 mmHg - - 65 - - - -  08/28/15 0446 (!) 178/97 mmHg 97.8 F (36.6 C) Oral (!) 56 18 97 % - -  08/27/15 2307 (!) 167/79 mmHg 98.2 F (36.8 C) Oral 64 20 98 % - -  08/27/15 1900 (!) 167/89 mmHg 98.3 F (36.8 C) Oral 73 20 97 % $Re'5\' 3"'IBw$  (1.6 m) 166 lb 14.2 oz (75.7 kg)  08/27/15 1730 165/92 mmHg - - 86 18 100 % - -  08/27/15 1700 124/96 mmHg - - 89 24 98 % - -  08/27/15 1630 153/89 mmHg - - 94 (!) 28 98 % - -  08/27/15 1629 153/89 mmHg - - 96 22 97 % - -  08/27/15 1605 - - - - 20 98 % - -  08/27/15 1526 149/93 mmHg - - 111 25 100 % - -  08/27/15 1510 - - - - (!) 35 - - -  08/27/15 1505 (!) 196/107 mmHg - - (!) 126 - 100 % - -    as well    Recent Labs  08/27/15 1525 08/28/15 0330  WBC 7.8 5.4  HGB 11.0* 11.4*  HCT 32.4* 34.2*  PLT 87* 72*    Recent Labs  08/27/15 1525 08/28/15 0330  NA 135 141  K 4.0 4.7  CL 109 116*  CO2 17* 18*  GLUCOSE 113* 155*  BUN 36* 38*  CREATININE 2.09* 1.71*  CALCIUM 8.3* 8.4*    Blood smear review:  None  Pathology: None     Assessment and Plan:  Jennifer Boyle is a 49 year old African woman. She has relapsed myeloma. She appeared to have some type of reaction the office yesterday. I would not think  that it was from the Kyprolis. She has had Kyprolis previously. I don't know she may have been vitamin overloaded from the blood.  Her oxygen saturation on room air is good today.  She, hopefully, will be able to go home soon.  I don't think this is related to the HIV.  She needs be on some kind of anticoagulation. She has a aspirin allergy. I'm not sure what that allergy might be. I would make sure that she is on DVT prophylaxis.  I very much appreciate the outstanding care that she is getting from everybody up on [address]. You all really do a great job with my patients and they appreciate how much you all do for them.  Pete E.  Romans 8:28

## 2015-08-28 NOTE — Progress Notes (Signed)
Discharge Instruction reviewed. Questions, concerns denied. Patient A&Ox4 and ambulatory. Pt encouraged to f/u with provider for lab redraw.

## 2015-08-28 NOTE — Discharge Summary (Signed)
Physician Discharge Summary  Jennifer Boyle XLK:[id number] DOB: [birth year] DOA: 08/27/2015  PCP: Jonathon Bellows, MD  Admit date: 08/27/2015 Discharge date: 08/28/2015  Time spent: 45 minutes  Recommendations for Outpatient Follow-up:  1. Dr.Ennever in 1 week, please check Bmet at FU   Discharge Diagnoses:  Principal Problem:   Blood transfusion reaction Active Problems:   Human immunodeficiency virus (HIV) disease ([place])   Multiple myeloma ([place])   Acute kidney injury ([place])   Transfusion reaction   Discharge Condition: stable  Diet recommendation: regular  Filed Weights   08/27/15 1900  Weight: 75.7 kg (166 lb 14.2 oz)    History of present illness:  Chief Complaint: Shortness of breath. HPI: Jennifer Boyle is a 49 y.o. female with history of multiple myeloma, HIV was recently restarted on oral chemotherapy 2 weeks ago and received 2 doses of IV chemotherapy last one 11/16 had blood transfusion 11/17 for anemia. Following the second unit of PRBC transfusion patient started developing shortness of breath  Hospital Course:  49/F with Relapsed IgA Myeloma, HIV; currently on Pomalidomide  She was started on Kyprolis- first dose 11/9 and second dose 11/17 followed by 2units PRBC, Subsequently developed Resp distress and was admitted, given IV lasix -Suspect Transfusion reaction, volume overload post transfusion could have contibuted -She was observed overnight, treated with Supportive care, Lasix -Clinically Improved and discharged home today -She was seen by Dr.Ennever this am too, her Mild AKI has improved, advised to avoid NSAIDs, stay well hydrated and recheck Bmet in 1 week  Consultations:  Dr.Ennever  Discharge Exam: Filed Vitals:   08/28/15 1305  BP: 136/80  Pulse: 92  Temp: 97.8 F (36.6 C)  Resp: 20    General: AAOx3 Cardiovascular: S!S2/RRR Respiratory: CTAB  Discharge Instructions   Discharge Instructions    Diet - low sodium heart healthy     Complete by:  As directed      Increase activity slowly    Complete by:  As directed           Current Discharge Medication List    CONTINUE these medications which have NOT CHANGED   Details  Abacavir-Dolutegravir-Lamivud 600-50-300 MG TABS Take 1 tablet by mouth every morning. Qty: 30 tablet, Refills: 5   Associated Diagnoses: Human immunodeficiency virus (HIV) disease (HCC)    Ascorbic Acid (VITAMIN C) 1000 MG tablet Take 1,000 mg by mouth daily.    calcium carbonate (OS-CAL) 600 MG TABS tablet Take 600 mg by mouth daily with breakfast.   Associated Diagnoses: Multiple myeloma (HCC)    cholecalciferol (VITAMIN D) 1000 UNITS tablet Take 1 tablet (1,000 Units total) by mouth daily. Qty: 30 tablet, Refills: 3    docusate sodium (COLACE) 100 MG capsule Take 100 mg by mouth 2 (two) times daily.    lidocaine-prilocaine (EMLA) cream Apply topically as needed. Apply to portacath one hour before chemo access or blood draw. Qty: 30 g, Refills: 3   Associated Diagnoses: Plasma cell neoplasm ([place]); Multiple myeloma ([place]); Multiple myeloma, without mention of having achieved remission    ondansetron (ZOFRAN) 8 MG tablet Take 8 mg by mouth every 8 (eight) hours as needed for nausea or vomiting.    Associated Diagnoses: Multiple myeloma in remission (HCC)    polyethylene glycol (MIRALAX / GLYCOLAX) packet Take 17 g by mouth daily as needed (constipation).     pomalidomide (POMALYST) 4 MG capsule Take 1 capsule (4 mg total) by mouth daily. Take with water on days 1-21. Repeat every 28 days.  [id number] Qty: 21 capsule, Refills: 0   Associated Diagnoses: Multiple myeloma in relapse (HCC)    TraMADol HCl 50 MG TBDP Take 50 mg by mouth every 6 (six) hours as needed (pain).    Associated Diagnoses: Multiple myeloma in remission (HCC)    valACYclovir (VALTREX) 500 MG tablet TAKE 1 TABLET BY MOUTH EVERY DAY Qty: 30 tablet, Refills: 4      STOP taking these medications     ketorolac  (TORADOL) 10 MG tablet        Allergies  Allergen Reactions  . Aspirin     Upset stomach   . Lactose Intolerance (Gi) Diarrhea  . Efavirenz Hives and Rash    (brand name sustiva)   Follow-up Information    Follow up with Volanda Napoleon, MD. Schedule an appointment as soon as possible for a visit in 1 week.   Specialty:  Oncology   Why:  please check Bmet at Follow up   Contact information:   [address] [place]  [address] [phone]        The results of significant diagnostics from this hospitalization (including imaging, microbiology, ancillary and laboratory) are listed below for reference.    Significant Diagnostic Studies: Dg Chest 2 View  08/12/2015  CLINICAL DATA:  Pneumonia. EXAM: CHEST  2 VIEW COMPARISON:  07/20/2015 and 07/30/2015 FINDINGS: There has been almost complete resolution of the infiltrate in the posterior aspect of the right upper lobe. Small focal area of increased density at the left base laterally has almost completely resolved as well. Heart size and vascularity are normal. No effusions. No osseous abnormality. Power port in place, unchanged. IMPRESSION: Almost complete resolution of the right upper lobe infiltrate. Clearing at the left base. Electronically Signed   By: Lorriane Shire M.D.   On: 08/12/2015 15:10   Dg Chest 2 View  07/30/2015  CLINICAL DATA:  Pneumonia.  Cough. EXAM: CHEST  2 VIEW COMPARISON:  None. FINDINGS: Prior port catheter noted in good anatomic position. Mediastinum hilar structures are normal . Persistent but partially clearing right upper lobe infiltrate. Persistent left base subsegmental atelectasis and or infiltrate. No pleural effusion or pneumothorax. Heart size normal. IMPRESSION: 1. PowerPort catheter in good anatomic position. 2. Persistent but partially clearing right upper lobe infiltrate. Persistent left base subsegmental atelectasis and or mild infiltrate. Electronically Signed   By: Marcello Moores  Register    On: 07/30/2015 12:42   US Renal  08/27/2015  CLINICAL DATA:  Acute renal failure. EXAM: RENAL / URINARY TRACT ULTRASOUND COMPLETE COMPARISON:  Renal ultrasound 12/20/2012 FINDINGS: Right Kidney: Length: 10.3 cm. Diffusely increased in cortical echogenicity. No mass or hydronephrosis visualized. Tiny echogenic focus in the lower pole measures 3 mm. Left Kidney: Length: 10.9 cm. Diffuse increase in cortical echogenicity. No mass or hydronephrosis visualized. Bladder: Appears normal for degree of bladder distention, only minimally distended. IMPRESSION: 1. Echogenic kidneys consistent with medical renal disease. This appears similar to prior exam. No hydronephrosis or obstructive uropathy. 2. Echogenic focus in the right lower kidney, likely a 3 mm nonobstructing stone. Electronically Signed   By: Jeb Levering M.D.   On: 08/27/2015 22:48   Dg Chest Port 1 View  08/27/2015  CLINICAL DATA:  Acute dyspnea, recent transfusion EXAM: PORTABLE CHEST - 1 VIEW COMPARISON:  08/12/2015 FINDINGS: Right chest wall port is again seen in stable position. The cardiac shadow is within normal limits. The lungs are well aerated with mild interstitial changes stable from the prior exam. No  focal infiltrate or sizable effusion is seen. No acute bony abnormality is noted. IMPRESSION: No active disease. Electronically Signed   By: Inez Catalina M.D.   On: 08/27/2015 15:18    Microbiology: Recent Results (from the past 240 hour(s))  Culture, blood (routine x 2)     Status: None (Preliminary result)   Collection Time: 08/27/15  4:18 PM  Result Value Ref Range Status   Specimen Description BLOOD RIGHT HAND  Final   Special Requests BOTTLES DRAWN AEROBIC AND ANAEROBIC [place]  Final   Culture   Final    NO GROWTH < 24 HOURS Performed at [facility]    Report Status PENDING  Incomplete  Culture, Urine     Status: None (Preliminary result)   Collection Time: 08/27/15  9:40 PM  Result Value Ref Range Status    Specimen Description URINE, RANDOM  Final   Special Requests NONE  Final   Culture   Final    TOO YOUNG TO READ Performed at [facility]    Report Status PENDING  Incomplete     Labs: Basic Metabolic Panel:  Recent Labs Lab 08/26/15 1406 08/27/15 1525 08/28/15 0330  NA 147* 135 141  K 4.0 4.0 4.7  CL 109* 109 116*  CO2 22 17* 18*  GLUCOSE 99 113* 155*  BUN 26* 36* 38*  CREATININE 1.4* 2.09* 1.71*  CALCIUM 9.3 8.3* 8.4*   Liver Function Tests:  Recent Labs Lab 08/26/15 1406 08/28/15 0330  AST 23 58*  ALT 21 61*  ALKPHOS 91* 112  BILITOT 0.60 0.4  PROT 7.6 6.8  ALBUMIN 2.4* 2.7*   No results for input(s): LIPASE, AMYLASE in the last 168 hours. No results for input(s): AMMONIA in the last 168 hours. CBC:  Recent Labs Lab 08/26/15 1407 08/27/15 1525 08/28/15 0330  WBC 8.8 7.8 5.4  NEUTROABS 1.8 2.5 2.3  HGB 8.8* 11.0* 11.4*  HCT 27.3* 32.4* 34.2*  MCV 101 94.2 94.7  PLT 115* 87* 72*   Cardiac Enzymes:  Recent Labs Lab 08/27/15 1525 08/27/15 2113 08/28/15 0330 08/28/15 0858  TROPONINI 0.04* 0.03 <0.03 <0.03   BNP: BNP (last 3 results)  Recent Labs  08/27/15 1525  BNP 305.7*    ProBNP (last 3 results) No results for input(s): PROBNP in the last 8760 hours.  CBG: No results for input(s): GLUCAP in the last 168 hours.     SignedDomenic Polite  [facility] 08/28/2015, 5:08 PM

## 2015-08-29 LAB — HIV 1/2 AB DIFFERENTIATION
HIV 1 Ab: POSITIVE — AB
HIV 2 Ab: NEGATIVE

## 2015-08-29 LAB — HIV ANTIBODY (ROUTINE TESTING W REFLEX)

## 2015-08-30 LAB — URINE CULTURE: Culture: 20000

## 2015-08-31 LAB — TRANSFUSION REACTION
DAT C3: NEGATIVE
Post RXN DAT IgG: NEGATIVE

## 2015-09-01 ENCOUNTER — Telehealth: Payer: Self-pay | Admitting: *Deleted

## 2015-09-01 DIAGNOSIS — C9002 Multiple myeloma in relapse: Secondary | ICD-10-CM

## 2015-09-01 LAB — CULTURE, BLOOD (ROUTINE X 2): CULTURE: NO GROWTH

## 2015-09-01 MED ORDER — FUROSEMIDE 20 MG PO TABS: 40.0000 mg | ORAL_TABLET | Freq: Every day | ORAL | Status: AC | PRN

## 2015-09-01 NOTE — Telephone Encounter (Signed)
Patient is experiencing some shortness of breath. She has had this recently and it was improved with lasix. She denies any other problems. She would like a prescription for po lasix to use as needed. Spoke with Dr Marin Olp who is fine with this. Prescription sent in. Patient aware.

## 2015-09-02 ENCOUNTER — Ambulatory Visit: Payer: Commercial Managed Care - PPO

## 2015-09-02 ENCOUNTER — Ambulatory Visit (HOSPITAL_BASED_OUTPATIENT_CLINIC_OR_DEPARTMENT_OTHER): Payer: Commercial Managed Care - PPO

## 2015-09-02 ENCOUNTER — Encounter: Payer: Self-pay | Admitting: *Deleted

## 2015-09-02 VITALS — BP 127/65 | HR 94 | Temp 98.9°F | Resp 20

## 2015-09-02 DIAGNOSIS — C9000 Multiple myeloma not having achieved remission: Secondary | ICD-10-CM

## 2015-09-02 DIAGNOSIS — C9002 Multiple myeloma in relapse: Secondary | ICD-10-CM

## 2015-09-02 LAB — CBC WITH DIFFERENTIAL (CANCER CENTER ONLY)
BASO#: 0 10*3/uL (ref 0.0–0.2)
BASO%: 0 % (ref 0.0–2.0)
EOS%: 9.4 % — ABNORMAL HIGH (ref 0.0–7.0)
Eosinophils Absolute: 0.4 10*3/uL (ref 0.0–0.5)
HEMATOCRIT: 31.2 % — AB (ref 34.8–46.6)
HGB: 10.6 g/dL — ABNORMAL LOW (ref 11.6–15.9)
LYMPH#: 2.5 10*3/uL (ref 0.9–3.3)
LYMPH%: 54.4 % — ABNORMAL HIGH (ref 14.0–48.0)
MCH: 32 pg (ref 26.0–34.0)
MCHC: 34 g/dL (ref 32.0–36.0)
MCV: 94 fL (ref 81–101)
MONO#: 0.4 10*3/uL (ref 0.1–0.9)
MONO%: 9.2 % (ref 0.0–13.0)
NEUT#: 1.3 10*3/uL — ABNORMAL LOW (ref 1.5–6.5)
NEUT%: 27 % — AB (ref 39.6–80.0)
Platelets: 74 10*3/uL — ABNORMAL LOW (ref 145–400)
RBC: 3.31 10*6/uL — ABNORMAL LOW (ref 3.70–5.32)
RDW: 15.7 % (ref 11.1–15.7)
WBC: 4.7 10*3/uL (ref 3.9–10.0)

## 2015-09-02 LAB — CMP (CANCER CENTER ONLY)
ALK PHOS: 88 U/L — AB (ref 26–84)
ALT(SGPT): 40 U/L (ref 10–47)
AST: 32 U/L (ref 11–38)
Albumin: 2.4 g/dL — ABNORMAL LOW (ref 3.3–5.5)
BUN, Bld: 35 mg/dL — ABNORMAL HIGH (ref 7–22)
CALCIUM: 8 mg/dL (ref 8.0–10.3)
CHLORIDE: 109 meq/L — AB (ref 98–108)
CO2: 14 meq/L — AB (ref 18–33)
Creat: 2.6 mg/dl — ABNORMAL HIGH (ref 0.6–1.2)
GLUCOSE: 103 mg/dL (ref 73–118)
POTASSIUM: 3.5 meq/L (ref 3.3–4.7)
Sodium: 135 mEq/L (ref 128–145)
Total Bilirubin: 0.9 mg/dl (ref 0.20–1.60)
Total Protein: 6.5 g/dL (ref 6.4–8.1)

## 2015-09-02 NOTE — Progress Notes (Signed)
Per VOV Dr Marin Olp, Kyprolis treatment held today d/t elevated creatinine, low platelets and patient sx of congestion/cough/fatigue. dph

## 2015-09-06 LAB — REFLEX TO GENOSURE(R) MG: HIV GENOSURE(R) MG PDF: 0

## 2015-09-06 LAB — HIV-1 RNA ULTRAQUANT REFLEX TO GENTYP+
HIV-1 RNA BY PCR: 292000 copies/mL
HIV-1 RNA Quant, Log: 5.465 log10copy/mL

## 2015-09-07 ENCOUNTER — Telehealth: Payer: Self-pay | Admitting: *Deleted

## 2015-09-07 NOTE — Telephone Encounter (Addendum)
Patient aware of results  ----- Message from Volanda Napoleon, MD sent at 09/07/2015 12:28 PM EST ----- Call - myeloma protein is down by 65%!!!  Demetrios Isaacs!!!  Laurey Arrow

## 2015-09-08 LAB — PROTEIN ELECTROPHORESIS, SERUM, WITH REFLEX
ABNORMAL PROTEIN BAND1: 0.5 g/dL
ALPHA-1-GLOBULIN: 0.6 g/dL — AB (ref 0.2–0.3)
Albumin ELP: 2.5 g/dL — ABNORMAL LOW (ref 3.8–4.8)
Alpha-2-Globulin: 1.1 g/dL — ABNORMAL HIGH (ref 0.5–0.9)
Beta 2: 0.2 g/dL (ref 0.2–0.5)
Beta Globulin: 0.3 g/dL — ABNORMAL LOW (ref 0.4–0.6)
GAMMA GLOBULIN: 1.1 g/dL (ref 0.8–1.7)
Total Protein, Serum Electrophoresis: 5.7 g/dL — ABNORMAL LOW (ref 6.1–8.1)

## 2015-09-08 LAB — IGG, IGA, IGM
IGA: 100 mg/dL (ref 69–380)
IGG (IMMUNOGLOBIN G), SERUM: 1220 mg/dL (ref 690–1700)
IGM, SERUM: 38 mg/dL — AB (ref 52–322)

## 2015-09-08 LAB — IFE INTERPRETATION

## 2015-09-16 ENCOUNTER — Ambulatory Visit (HOSPITAL_BASED_OUTPATIENT_CLINIC_OR_DEPARTMENT_OTHER): Payer: Commercial Managed Care - PPO | Admitting: Hematology & Oncology

## 2015-09-16 ENCOUNTER — Ambulatory Visit (HOSPITAL_BASED_OUTPATIENT_CLINIC_OR_DEPARTMENT_OTHER): Payer: Commercial Managed Care - PPO

## 2015-09-16 ENCOUNTER — Encounter: Payer: Self-pay | Admitting: Hematology & Oncology

## 2015-09-16 VITALS — BP 122/65 | HR 92 | Temp 98.9°F | Resp 18 | Wt 158.0 lb

## 2015-09-16 DIAGNOSIS — C9002 Multiple myeloma in relapse: Secondary | ICD-10-CM

## 2015-09-16 DIAGNOSIS — B20 Human immunodeficiency virus [HIV] disease: Secondary | ICD-10-CM

## 2015-09-16 DIAGNOSIS — Z5112 Encounter for antineoplastic immunotherapy: Secondary | ICD-10-CM | POA: Diagnosis not present

## 2015-09-16 LAB — CMP (CANCER CENTER ONLY)
ALBUMIN: 2.5 g/dL — AB (ref 3.3–5.5)
ALK PHOS: 118 U/L — AB (ref 26–84)
ALT: 18 U/L (ref 10–47)
AST: 21 U/L (ref 11–38)
BILIRUBIN TOTAL: 0.6 mg/dL (ref 0.20–1.60)
BUN, Bld: 17 mg/dL (ref 7–22)
CALCIUM: 7.9 mg/dL — AB (ref 8.0–10.3)
CO2: 18 mEq/L (ref 18–33)
CREATININE: 1.9 mg/dL — AB (ref 0.6–1.2)
Chloride: 107 mEq/L (ref 98–108)
Glucose, Bld: 131 mg/dL — ABNORMAL HIGH (ref 73–118)
Potassium: 3.9 mEq/L (ref 3.3–4.7)
SODIUM: 141 meq/L (ref 128–145)
TOTAL PROTEIN: 6.4 g/dL (ref 6.4–8.1)

## 2015-09-16 LAB — CBC WITH DIFFERENTIAL (CANCER CENTER ONLY)
BASO#: 0 10*3/uL (ref 0.0–0.2)
BASO%: 0.2 % (ref 0.0–2.0)
EOS%: 24.3 % — AB (ref 0.0–7.0)
Eosinophils Absolute: 1 10*3/uL — ABNORMAL HIGH (ref 0.0–0.5)
HEMATOCRIT: 27.6 % — AB (ref 34.8–46.6)
HEMOGLOBIN: 8.8 g/dL — AB (ref 11.6–15.9)
LYMPH#: 1.4 10*3/uL (ref 0.9–3.3)
LYMPH%: 33.7 % (ref 14.0–48.0)
MCH: 32.2 pg (ref 26.0–34.0)
MCHC: 31.9 g/dL — AB (ref 32.0–36.0)
MCV: 101 fL (ref 81–101)
MONO#: 0.2 10*3/uL (ref 0.1–0.9)
MONO%: 4.2 % (ref 0.0–13.0)
NEUT%: 37.6 % — ABNORMAL LOW (ref 39.6–80.0)
NEUTROS ABS: 1.5 10*3/uL (ref 1.5–6.5)
Platelets: 106 10*3/uL — ABNORMAL LOW (ref 145–400)
RBC: 2.73 10*6/uL — AB (ref 3.70–5.32)
RDW: 15.6 % (ref 11.1–15.7)
WBC: 4.1 10*3/uL (ref 3.9–10.0)

## 2015-09-16 MED ORDER — HEPARIN SOD (PORK) LOCK FLUSH 100 UNIT/ML IV SOLN
500.0000 [IU] | Freq: Once | INTRAVENOUS | Status: AC | PRN
  Administered 2015-09-16: 500 [IU]
  Filled 2015-09-16: qty 5

## 2015-09-16 MED ORDER — SODIUM CHLORIDE 0.9 % IJ SOLN
10.0000 mL | INTRAMUSCULAR | Status: DC | PRN
  Administered 2015-09-16: 10 mL
  Filled 2015-09-16: qty 10

## 2015-09-16 MED ORDER — SODIUM CHLORIDE 0.9 % IV SOLN
Freq: Once | INTRAVENOUS | Status: AC
  Administered 2015-09-16: 14:00:00 via INTRAVENOUS
  Filled 2015-09-16: qty 4

## 2015-09-16 MED ORDER — SODIUM CHLORIDE 0.9 % IV SOLN
Freq: Once | INTRAVENOUS | Status: AC
  Administered 2015-09-16: 14:00:00 via INTRAVENOUS

## 2015-09-16 MED ORDER — DEXTROSE 5 % IV SOLN: 56.0000 mg/m2 | Freq: Once | INTRAVENOUS | Status: DC

## 2015-09-16 MED ORDER — DEXTROSE 5 % IV SOLN
56.0000 mg/m2 | Freq: Once | INTRAVENOUS | Status: AC
  Administered 2015-09-16: 104 mg via INTRAVENOUS
  Filled 2015-09-16: qty 52

## 2015-09-16 NOTE — Patient Instructions (Signed)
Carfilzomib injection What is this medicine? CARFILZOMIB (kar FILZ oh mib) targets a specific protein within cancer cells and stops the cancer cells from growing. It is used to treat multiple myeloma. This medicine may be used for other purposes; ask your health care provider or pharmacist if you have questions. What should I tell my health care provider before I take this medicine? They need to know if you have any of these conditions: -heart disease -history of blood clots -irregular heartbeat -kidney disease -liver disease -lung or breathing disease -an unusual or allergic reaction to carfilzomib, or other medicines, foods, dyes, or preservatives -pregnant or trying to get pregnant -breast-feeding How should I use this medicine? This medicine is for injection or infusion into a vein. It is given by a health care professional in a hospital or clinic setting. Talk to your pediatrician regarding the use of this medicine in children. Special care may be needed. Overdosage: If you think you have taken too much of this medicine contact a poison control center or emergency room at once. NOTE: This medicine is only for you. Do not share this medicine with others. What if I miss a dose? It is important not to miss your dose. Call your doctor or health care professional if you are unable to keep an appointment. What may interact with this medicine? Interactions are not expected. Give your health care provider a list of all the medicines, herbs, non-prescription drugs, or dietary supplements you use. Also tell them if you smoke, drink alcohol, or use illegal drugs. Some items may interact with your medicine. This list may not describe all possible interactions. Give your health care provider a list of all the medicines, herbs, non-prescription drugs, or dietary supplements you use. Also tell them if you smoke, drink alcohol, or use illegal drugs. Some items may interact with your medicine. What  should I watch for while using this medicine? Your condition will be monitored carefully while you are receiving this medicine. Report any side effects. Continue your course of treatment even though you feel ill unless your doctor tells you to stop. You may need blood work done while you are taking this medicine. Do not become pregnant while taking this medicine or for at least 30 days after stopping it. Women should inform their doctor if they wish to become pregnant or think they might be pregnant. There is a potential for serious side effects to an unborn child. Men should not father a child while taking this medicine and for 90 days after stopping it. Talk to your health care professional or pharmacist for more information. Do not breast-feed an infant while taking this medicine. Check with your doctor or health care professional if you get an attack of severe diarrhea, nausea and vomiting, or if you sweat a lot. The loss of too much body fluid can make it dangerous for you to take this medicine. You may get dizzy. Do not drive, use machinery, or do anything that needs mental alertness until you know how this medicine affects you. Do not stand or sit up quickly, especially if you are an older patient. This reduces the risk of dizzy or fainting spells. What side effects may I notice from receiving this medicine? Side effects that you should report to your doctor or health care professional as soon as possible: -allergic reactions like skin rash, itching or hives, swelling of the face, lips, or tongue -confusion -dizziness -feeling faint or lightheaded -fever or chills -palpitations -seizures -signs and   symptoms of bleeding such as bloody or black, tarry stools; red or dark-brown urine; spitting up blood or brown material that looks like coffee grounds; red spots on the skin; unusual bruising or bleeding including from the eye, gums, or nose -signs and symptoms of a blood clot such as breathing  problems; changes in vision; chest pain; severe, sudden headache; pain, swelling, warmth in the leg; trouble speaking; sudden numbness or weakness of the face, arm or leg -signs and symptoms of kidney injury like trouble passing urine or change in the amount of urine -signs and symptoms of liver injury like dark yellow or brown urine; general ill feeling or flu-like symptoms; light-colored stools; loss of appetite; nausea; right upper belly pain; unusually weak or tired; yellowing of the eyes or skin Side effects that usually do not require medical attention (report to your doctor or health care professional if they continue or are bothersome): -back pain -cough -diarrhea -headache -muscle cramps -vomiting This list may not describe all possible side effects. Call your doctor for medical advice about side effects. You may report side effects to FDA at 1-800-FDA-1088. Where should I keep my medicine? This drug is given in a hospital or clinic and will not be stored at home. NOTE: This sheet is a summary. It may not cover all possible information. If you have questions about this medicine, talk to your doctor, pharmacist, or health care provider.    2016, Elsevier/Gold Standard. (2015-05-19 16:16:00)  

## 2015-09-16 NOTE — Progress Notes (Signed)
Ok to treat per Dr Marin Olp with Creat of 1.9.

## 2015-09-16 NOTE — Progress Notes (Signed)
Hematology and Oncology Follow Up Visit  Jennifer Boyle [id number] 05-17-1966 49 y.o. 09/16/2015   Principle Diagnosis:   IgG kappa myeloma - relapsed   HIV-positive  Current Therapy:    S/p BMT - Autologous at [facility] in 05/2014  Kyprolis-every week dosing-  Pomalidomide 4 mg by mouth daily (21/7)      Interim History:  Ms.  Boyle is back for followup.  She's feeling a little tired. She has had a problem with fever. She's had no issues with shortness of breath. She does get a little bit short of breath with moderate exertion.  She is responding to therapy which is nice to see. Her last immunoglobulin levels were down. Her IgG level was down to 1220 mg/dL. We do not have the liight chains.  She is still working. 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 She will be working on Christmas.  She's had no further issues with pneumonia.  She does have HIV. We did check her titers back in November. They seem to be on the high side from my point of view. She is HAART medications. She does see a HIV specialist. I left a message for him to look at her titers and see if anything different needs to be done.  She has had no rashes.  She's had no change in bowel or bladder habits.  She's not complaining of any obvious pain.  Overall, her performance status is ECOG 1.  Medications:  Current outpatient prescriptions:  .  Abacavir-Dolutegravir-Lamivud 600-50-300 MG TABS, Take 1 tablet by mouth every morning., Disp: 30 tablet, Rfl: 5 .  Ascorbic Acid (VITAMIN C) 1000 MG tablet, Take 1,000 mg by mouth daily., Disp: , Rfl:  .  calcium carbonate (OS-CAL) 600 MG TABS tablet, Take 600 mg by mouth daily with breakfast., Disp: , Rfl:  .  cholecalciferol (VITAMIN D) 1000 UNITS tablet, Take 1 tablet (1,000 Units total) by mouth daily., Disp: 30 tablet, Rfl: 3 .  docusate sodium (COLACE) 100 MG capsule, Take 100 mg by mouth 2 (two) times daily., Disp: , Rfl:  .  furosemide (LASIX) 20 MG tablet,  Take 2 tablets (40 mg total) by mouth daily as needed., Disp: 30 tablet, Rfl: 3 .  lidocaine-prilocaine (EMLA) cream, Apply topically as needed. Apply to portacath one hour before chemo access or blood draw., Disp: 30 g, Rfl: 3 .  ondansetron (ZOFRAN) 8 MG tablet, Take 8 mg by mouth every 8 (eight) hours as needed for nausea or vomiting. , Disp: , Rfl:  .  polyethylene glycol (MIRALAX / GLYCOLAX) packet, Take 17 g by mouth daily as needed (constipation). , Disp: , Rfl:  .  pomalidomide (POMALYST) 4 MG capsule, Take 1 capsule (4 mg total) by mouth daily. Take with water on days 1-21. Repeat every 28 days. [id number], Disp: 21 capsule, Rfl: 0 .  TraMADol HCl 50 MG TBDP, Take 50 mg by mouth every 6 (six) hours as needed (pain). , Disp: , Rfl:  .  valACYclovir (VALTREX) 500 MG tablet, TAKE 1 TABLET BY MOUTH EVERY DAY, Disp: 30 tablet, Rfl: 4 No current facility-administered medications for this visit.  Facility-Administered Medications Ordered in Other Visits:  .  sodium chloride 0.9 % injection 10 mL, 10 mL, Intracatheter, PRN, Volanda Napoleon, MD, 10 mL at 09/16/15 1605  Allergies:  Allergies  Allergen Reactions  . Aspirin     Upset stomach   . Lactose Intolerance (Gi) Diarrhea  . Efavirenz Hives and Rash    (  brand name sustiva)    Past Medical History, Surgical history, Social history, and Family History were reviewed and updated.  Review of Systems: As above  Physical Exam:  weight is 158 lb (71.668 kg). Her oral temperature is 98.9 F (37.2 C). Her blood pressure is 122/65 and her pulse is 92. Her respiration is 18.   [name]. Head and exam shows no ocular or oral lesions. There are no palpable cervical or supraclavicular lymph nodes. Lungs are clear. Cardiac exam regular rate and rhythm with no murmurs rubs or bruits. Abdomen is soft. She has good bowel sounds. There is no fluid wave. There is no palpable liver or spleen tip. Back exam shows  no tenderness over the spine, ribs or hips. Extremities shows no clubbing, cyanosis or edema. Neurological exam shows no focal neurological deficits. Skin exam no rashes, ecchymosis or petechia.  Lab Results  Component Value Date   WBC 4.1 09/16/2015   HGB 8.8* 09/16/2015   HCT 27.6* 09/16/2015   MCV 101 09/16/2015   PLT 106* 09/16/2015     Chemistry      Component Value Date/Time   NA 141 09/16/2015 1203   NA 141 08/28/2015 0330   NA 141 04/04/2014 0852   K 3.9 09/16/2015 1203   K 4.7 08/28/2015 0330   K 4.2 04/04/2014 0852   CL 107 09/16/2015 1203   CL 116* 08/28/2015 0330   CL 111* 03/29/2013 1351   CO2 18 09/16/2015 1203   CO2 18* 08/28/2015 0330   CO2 22 04/04/2014 0852   BUN 17 09/16/2015 1203   BUN 38* 08/28/2015 0330   BUN 20.9 04/04/2014 0852   CREATININE 1.9* 09/16/2015 1203   CREATININE 1.71* 08/28/2015 0330   CREATININE 1.2* 04/04/2014 0852      Component Value Date/Time   CALCIUM 7.9* 09/16/2015 1203   CALCIUM 8.4* 08/28/2015 0330   CALCIUM 9.4 04/04/2014 0852   ALKPHOS 118* 09/16/2015 1203   ALKPHOS 112 08/28/2015 0330   ALKPHOS 94 04/04/2014 0852   AST 21 09/16/2015 1203   AST 58* 08/28/2015 0330   AST 20 04/04/2014 0852   ALT 18 09/16/2015 1203   ALT 61* 08/28/2015 0330   ALT 17 04/04/2014 0852   BILITOT 0.60 09/16/2015 1203   BILITOT 0.4 08/28/2015 0330   BILITOT 0.23 04/04/2014 0852         Impression and Plan: Jennifer Boyle is 49 year old African [place] female. She has IgG kappa myeloma. She ultimately underwent a stem cell transplant. This was back on May 23, 2014. This was done at [facility]  Unfortunately, because of her progressive disease, we have initiated therapy on her.  It is encouraging that her IgG level has gone down. Hopefully, we will continue do see a drop. We will have to see what the Kappa Lightchain levels show.  I am worried about her HIV status. By the last HIV titers, it seems as if her disease is becoming  active again. I left a message for her infectious disease specialist to take a look of the HIV titers and see if any adjustments need to be made with her treatments.  Her quality of life is very important for her. She still wants to be able to work and be able to help others. 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.  We will continue her on the weekly Kyprolis with Pomalidomide. Hopefully, we will see a continued response.  If we see that continued  response, then we might want to consider her for another stem cell transplant. I will keep her physicians at [facility] informed as to her progress. lan to see her back in about a month.    Volanda Napoleon, MD 12/7/20165:22 PM

## 2015-09-18 ENCOUNTER — Encounter: Payer: Self-pay | Admitting: *Deleted

## 2015-09-18 ENCOUNTER — Telehealth: Payer: Self-pay | Admitting: Hematology & Oncology

## 2015-09-18 LAB — IGG, IGA, IGM
IGA: 276 mg/dL (ref 69–380)
IGG (IMMUNOGLOBIN G), SERUM: 1380 mg/dL (ref 690–1700)
IGM, SERUM: 95 mg/dL (ref 52–322)

## 2015-09-18 LAB — PROTEIN ELECTROPHORESIS, SERUM, WITH REFLEX
ALPHA-1-GLOBULIN: 0.4 g/dL — AB (ref 0.2–0.3)
ALPHA-2-GLOBULIN: 0.9 g/dL (ref 0.5–0.9)
Abnormal Protein Band1: 0.5 g/dL
Albumin ELP: 2.8 g/dL — ABNORMAL LOW (ref 3.8–4.8)
BETA GLOBULIN: 0.2 g/dL — AB (ref 0.4–0.6)
Beta 2: 0.3 g/dL (ref 0.2–0.5)
GAMMA GLOBULIN: 1.2 g/dL (ref 0.8–1.7)
Total Protein, Serum Electrophoresis: 5.9 g/dL — ABNORMAL LOW (ref 6.1–8.1)

## 2015-09-18 LAB — KAPPA/LAMBDA LIGHT CHAINS
KAPPA LAMBDA RATIO: 32.55 — AB (ref 0.26–1.65)
Kappa free light chain: 345 mg/dL — ABNORMAL HIGH (ref 0.33–1.94)
Lambda Free Lght Chn: 10.6 mg/dL — ABNORMAL HIGH (ref 0.57–2.63)

## 2015-09-18 LAB — IFE INTERPRETATION

## 2015-09-18 NOTE — Telephone Encounter (Signed)
FMLA papers completed.         COPY SCANNED

## 2015-09-22 ENCOUNTER — Encounter: Payer: Self-pay | Admitting: Hematology & Oncology

## 2015-09-23 ENCOUNTER — Other Ambulatory Visit: Payer: Self-pay

## 2015-09-23 ENCOUNTER — Ambulatory Visit: Payer: Self-pay

## 2015-09-30 ENCOUNTER — Other Ambulatory Visit: Payer: Self-pay

## 2015-09-30 ENCOUNTER — Ambulatory Visit: Payer: Self-pay

## 2015-10-11 DEATH — deceased

## 2015-10-14 ENCOUNTER — Other Ambulatory Visit: Payer: Self-pay

## 2015-10-14 ENCOUNTER — Ambulatory Visit: Payer: Self-pay

## 2015-10-14 ENCOUNTER — Ambulatory Visit: Payer: Self-pay | Admitting: Hematology & Oncology

## 2015-10-21 ENCOUNTER — Other Ambulatory Visit: Payer: Self-pay

## 2015-10-21 ENCOUNTER — Ambulatory Visit: Payer: Self-pay

## 2015-10-28 ENCOUNTER — Other Ambulatory Visit: Payer: Self-pay

## 2015-10-28 ENCOUNTER — Ambulatory Visit: Payer: Self-pay

## 2015-11-11 ENCOUNTER — Ambulatory Visit: Payer: Self-pay | Admitting: Hematology & Oncology

## 2015-11-11 ENCOUNTER — Ambulatory Visit: Payer: Self-pay

## 2015-11-11 ENCOUNTER — Other Ambulatory Visit: Payer: Self-pay

## 2015-11-18 ENCOUNTER — Other Ambulatory Visit: Payer: Self-pay

## 2015-11-18 ENCOUNTER — Ambulatory Visit: Payer: Self-pay

## 2015-11-25 ENCOUNTER — Other Ambulatory Visit: Payer: Self-pay

## 2015-11-25 ENCOUNTER — Ambulatory Visit: Payer: Self-pay

## 2015-12-09 ENCOUNTER — Other Ambulatory Visit: Payer: Self-pay

## 2015-12-09 ENCOUNTER — Ambulatory Visit: Payer: Self-pay

## 2015-12-09 ENCOUNTER — Ambulatory Visit: Payer: Self-pay | Admitting: Hematology & Oncology

## 2015-12-16 ENCOUNTER — Other Ambulatory Visit: Payer: Self-pay

## 2015-12-16 ENCOUNTER — Ambulatory Visit: Payer: Self-pay

## 2016-08-05 ENCOUNTER — Other Ambulatory Visit: Payer: Self-pay | Admitting: Family

## 2016-08-05 DIAGNOSIS — Z452 Encounter for adjustment and management of vascular access device: Secondary | ICD-10-CM

## 2016-09-09 IMAGING — DX DG CHEST 2V
2 series · 2 of 2 positions shown · non-contrast
Comparison: [DATE], 11/14/2009

CLINICAL DATA: Chest pain shortness of breath chills, all which
began on [REDACTED]

EXAM:
CHEST  2 VIEW

[chest pa]
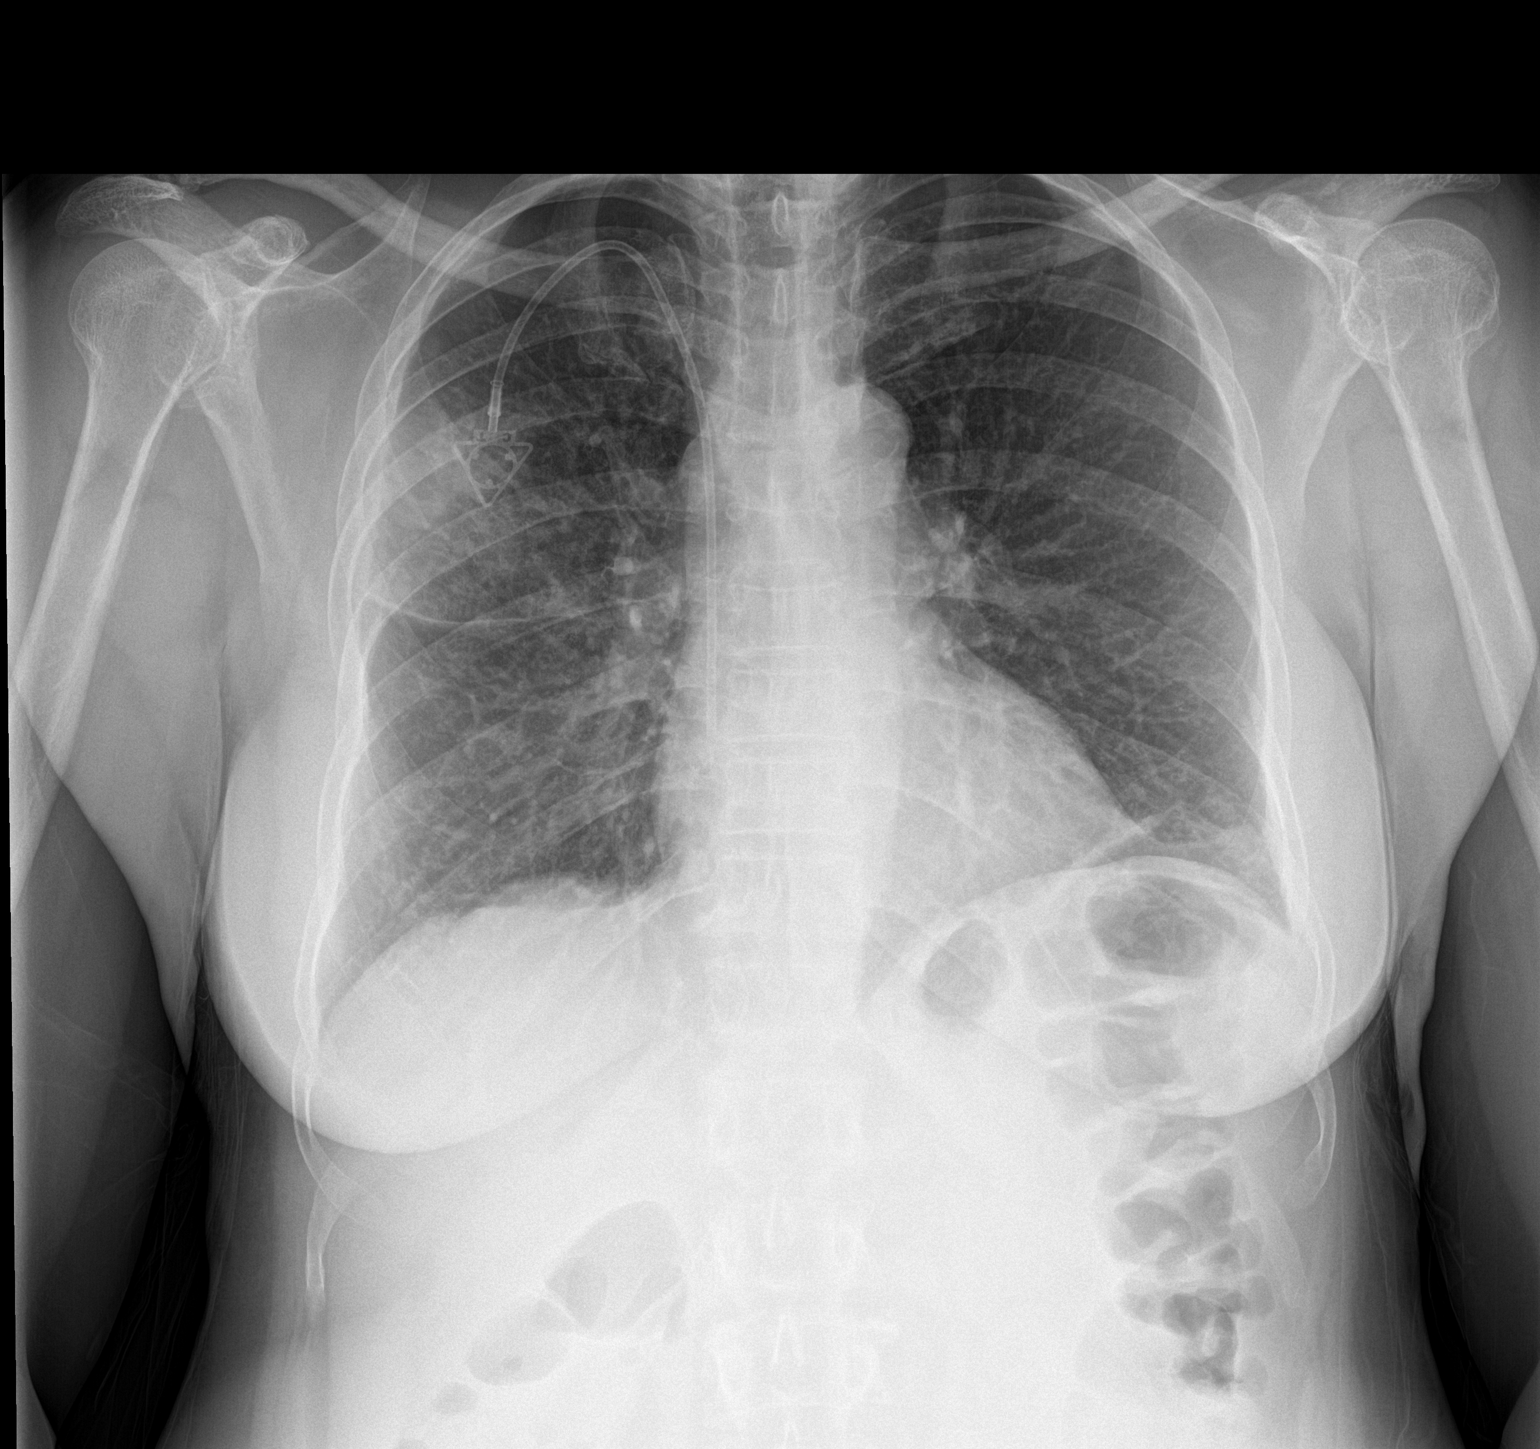

[chest lat]
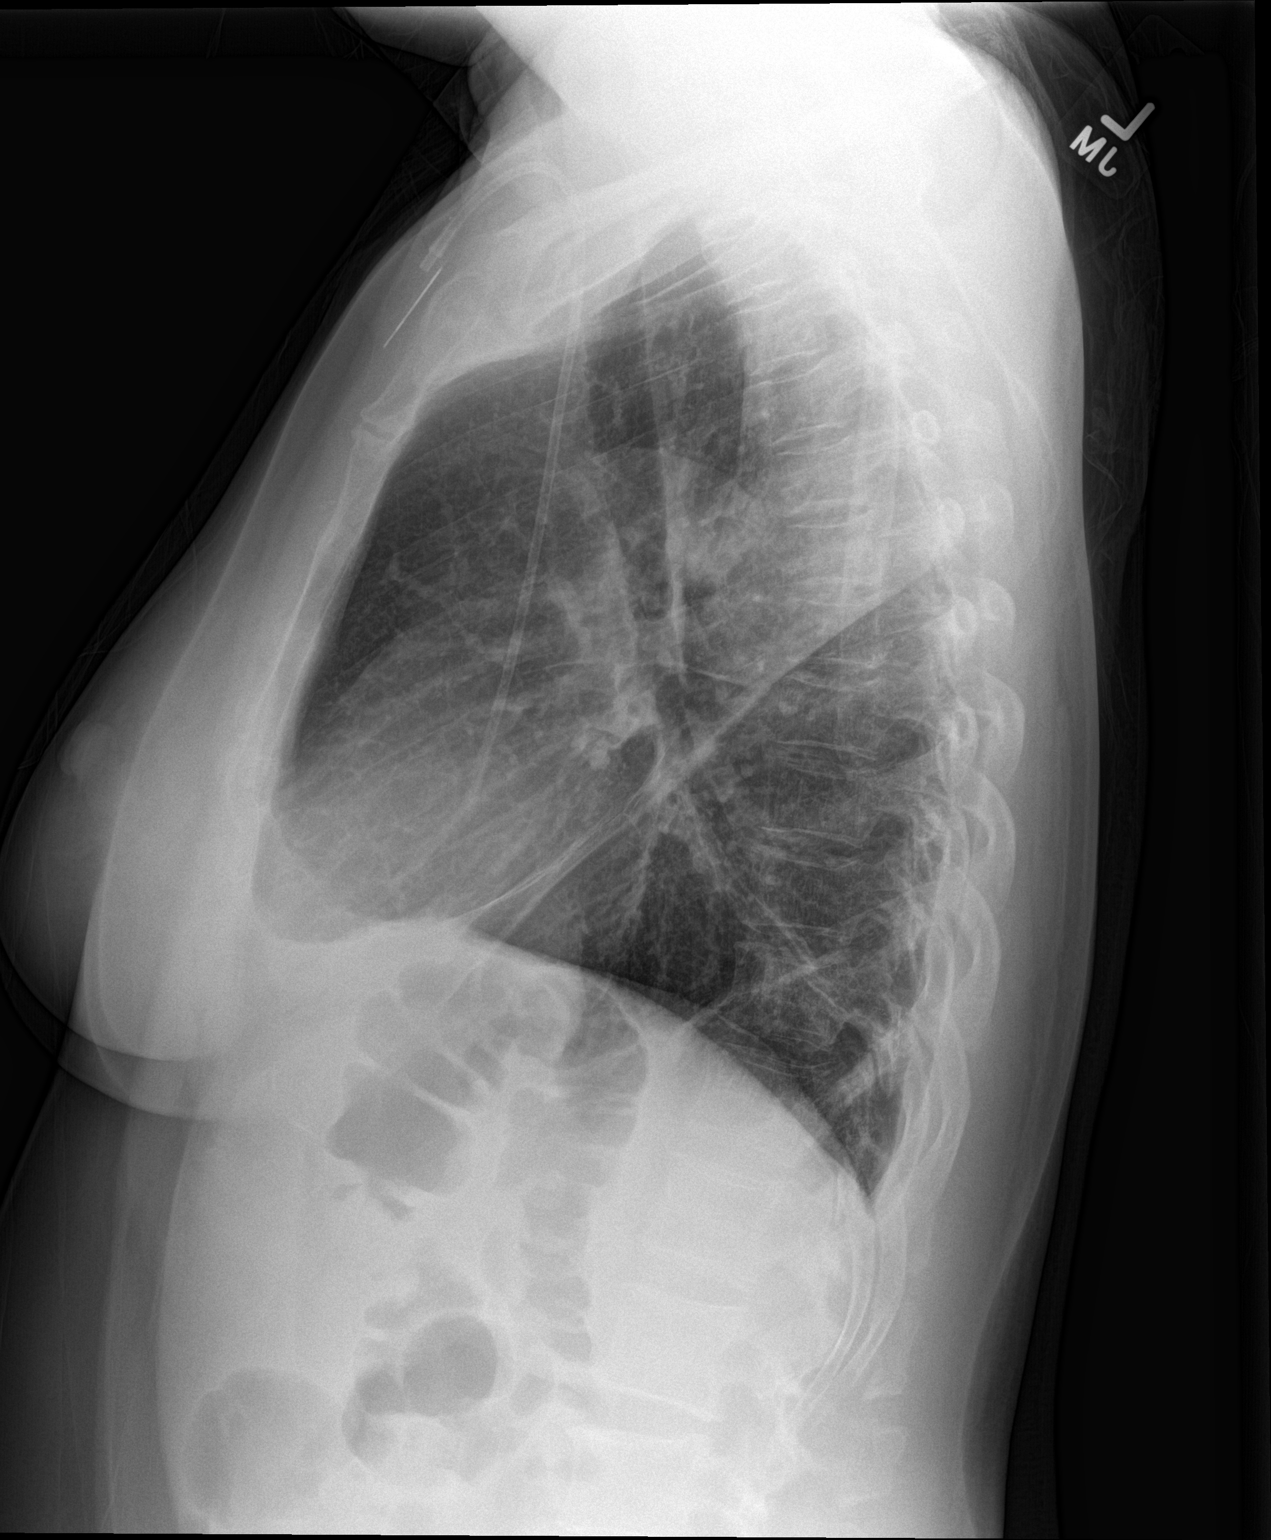

[2 of 2 positions shown; findings below may reference images not displayed]

FINDINGS: The heart size and vascular pattern are normal. There is mild
opacity at the left base which appears most consistent with
subsegmental atelectasis. The left lung is otherwise clear.

On the right there is a Port-A-Cath with tip just into the right
atrium. There is hazy rounded opacity laterally in the right upper
lobe, within which appears to be a thick walled rounded area with
central lucency measuring about 3 cm.
IMPRESSION: Right upper lobe infiltrate concerning for pneumonia with possible
cavitation. Mass not excluded. Contrast-enhanced CT thorax
suggested.

## 2016-10-04 ENCOUNTER — Other Ambulatory Visit: Payer: Self-pay | Admitting: Nurse Practitioner
